# Patient Record
Sex: Male | Born: 1945 | Race: White | Hispanic: No | Marital: Married | Smoking: Former smoker
Health system: Southern US, Community
[De-identification: ages and names within clinical notes are randomized; demographics above are authoritative.]

## PROBLEM LIST (undated history)

## (undated) DIAGNOSIS — M65331 Trigger finger, right middle finger: Secondary | ICD-10-CM

## (undated) DIAGNOSIS — K635 Polyp of colon: Secondary | ICD-10-CM

## (undated) DIAGNOSIS — R600 Localized edema: Secondary | ICD-10-CM

## (undated) DIAGNOSIS — L570 Actinic keratosis: Secondary | ICD-10-CM

## (undated) DIAGNOSIS — N19 Unspecified kidney failure: Secondary | ICD-10-CM

## (undated) DIAGNOSIS — I10 Essential (primary) hypertension: Secondary | ICD-10-CM

## (undated) DIAGNOSIS — T4145XA Adverse effect of unspecified anesthetic, initial encounter: Secondary | ICD-10-CM

## (undated) DIAGNOSIS — T8859XA Other complications of anesthesia, initial encounter: Secondary | ICD-10-CM

## (undated) DIAGNOSIS — M1990 Unspecified osteoarthritis, unspecified site: Secondary | ICD-10-CM

## (undated) DIAGNOSIS — I82409 Acute embolism and thrombosis of unspecified deep veins of unspecified lower extremity: Secondary | ICD-10-CM

## (undated) DIAGNOSIS — Z972 Presence of dental prosthetic device (complete) (partial): Secondary | ICD-10-CM

## (undated) DIAGNOSIS — T7840XA Allergy, unspecified, initial encounter: Secondary | ICD-10-CM

## (undated) DIAGNOSIS — G5603 Carpal tunnel syndrome, bilateral upper limbs: Secondary | ICD-10-CM

## (undated) DIAGNOSIS — Z87442 Personal history of urinary calculi: Secondary | ICD-10-CM

## (undated) HISTORY — DX: Actinic keratosis: L57.0

## (undated) HISTORY — PX: JOINT REPLACEMENT: SHX530

## (undated) HISTORY — DX: Allergy, unspecified, initial encounter: T78.40XA

## (undated) HISTORY — DX: Essential (primary) hypertension: I10

## (undated) HISTORY — PX: EXTRACORPOREAL SHOCK WAVE LITHOTRIPSY: SHX1557

---
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
## 2000-05-03 ENCOUNTER — Other Ambulatory Visit: Admission: RE | Admit: 2000-05-03 | Discharge: 2000-05-03 | Payer: Self-pay | Admitting: Gastroenterology

## 2000-09-07 HISTORY — PX: OSTEOTOMY: SHX137

## 2007-03-17 DIAGNOSIS — Z720 Tobacco use: Secondary | ICD-10-CM | POA: Insufficient documentation

## 2007-12-13 DIAGNOSIS — E291 Testicular hypofunction: Secondary | ICD-10-CM | POA: Insufficient documentation

## 2013-08-18 ENCOUNTER — Inpatient Hospital Stay: Payer: Self-pay | Admitting: Specialist

## 2013-08-18 LAB — URINALYSIS, COMPLETE
Bilirubin,UR: NEGATIVE
Glucose,UR: NEGATIVE mg/dL (ref 0–75)
Hyaline Cast: 2
Ketone: NEGATIVE
Ph: 5 (ref 4.5–8.0)
Protein: NEGATIVE
Specific Gravity: 1.005 (ref 1.003–1.030)

## 2013-08-18 LAB — COMPREHENSIVE METABOLIC PANEL
Alkaline Phosphatase: 75 U/L
Anion Gap: 16 (ref 7–16)
BUN: 135 mg/dL — ABNORMAL HIGH (ref 7–18)
Bilirubin,Total: 0.7 mg/dL (ref 0.2–1.0)
Calcium, Total: 9.2 mg/dL (ref 8.5–10.1)
Chloride: 96 mmol/L — ABNORMAL LOW (ref 98–107)
EGFR (African American): 4 — ABNORMAL LOW
EGFR (Non-African Amer.): 3 — ABNORMAL LOW
Osmolality: 300 (ref 275–301)
SGPT (ALT): 42 U/L (ref 12–78)

## 2013-08-18 LAB — CBC WITH DIFFERENTIAL/PLATELET
Basophil %: 0.5 %
Eosinophil %: 1.4 %
HCT: 38.2 % — ABNORMAL LOW (ref 40.0–52.0)
HGB: 12.8 g/dL — ABNORMAL LOW (ref 13.0–18.0)
Lymphocyte #: 0.8 10*3/uL — ABNORMAL LOW (ref 1.0–3.6)
MCV: 88 fL (ref 80–100)
Monocyte #: 1.5 x10 3/mm — ABNORMAL HIGH (ref 0.2–1.0)
Monocyte %: 11.8 %
RBC: 4.33 10*6/uL — ABNORMAL LOW (ref 4.40–5.90)

## 2013-08-18 LAB — LIPID PANEL
HDL Cholesterol: 38 mg/dL — ABNORMAL LOW (ref 40–60)
Ldl Cholesterol, Calc: 69 mg/dL (ref 0–100)
Triglycerides: 35 mg/dL (ref 0–200)
VLDL Cholesterol, Calc: 7 mg/dL (ref 5–40)

## 2013-08-19 LAB — CK: CK, Total: 26 U/L — ABNORMAL LOW (ref 35–232)

## 2013-08-19 LAB — BASIC METABOLIC PANEL
Anion Gap: 16 (ref 7–16)
Calcium, Total: 8.9 mg/dL (ref 8.5–10.1)
EGFR (Non-African Amer.): 3 — ABNORMAL LOW
Glucose: 101 mg/dL — ABNORMAL HIGH (ref 65–99)
Osmolality: 304 (ref 275–301)
Potassium: 5.4 mmol/L — ABNORMAL HIGH (ref 3.5–5.1)
Sodium: 131 mmol/L — ABNORMAL LOW (ref 136–145)

## 2013-08-20 LAB — BASIC METABOLIC PANEL
Anion Gap: 11 (ref 7–16)
BUN: 96 mg/dL — ABNORMAL HIGH (ref 7–18)
Calcium, Total: 8.4 mg/dL — ABNORMAL LOW (ref 8.5–10.1)
Chloride: 102 mmol/L (ref 98–107)
Co2: 21 mmol/L (ref 21–32)
Creatinine: 11.35 mg/dL — ABNORMAL HIGH (ref 0.60–1.30)
EGFR (African American): 5 — ABNORMAL LOW
EGFR (Non-African Amer.): 4 — ABNORMAL LOW
Glucose: 100 mg/dL — ABNORMAL HIGH (ref 65–99)
Osmolality: 298 (ref 275–301)
Potassium: 4.9 mmol/L (ref 3.5–5.1)
Sodium: 134 mmol/L — ABNORMAL LOW (ref 136–145)

## 2013-08-20 LAB — PHOSPHORUS: Phosphorus: 5.4 mg/dL — ABNORMAL HIGH (ref 2.5–4.9)

## 2013-08-21 LAB — BASIC METABOLIC PANEL
BUN: 53 mg/dL — ABNORMAL HIGH (ref 7–18)
Calcium, Total: 8.8 mg/dL (ref 8.5–10.1)
Chloride: 108 mmol/L — ABNORMAL HIGH (ref 98–107)
Creatinine: 7.08 mg/dL — ABNORMAL HIGH (ref 0.60–1.30)
EGFR (African American): 8 — ABNORMAL LOW

## 2013-08-21 LAB — PROTEIN ELECTROPHORESIS(ARMC)

## 2013-08-22 LAB — BASIC METABOLIC PANEL
Calcium, Total: 9.1 mg/dL (ref 8.5–10.1)
Creatinine: 6.92 mg/dL — ABNORMAL HIGH (ref 0.60–1.30)
EGFR (Non-African Amer.): 7 — ABNORMAL LOW
Potassium: 4.9 mmol/L (ref 3.5–5.1)

## 2013-08-22 LAB — PROTEIN / CREATININE RATIO, URINE: Creatinine, Urine: 29.7 mg/dL — ABNORMAL LOW (ref 30.0–125.0)

## 2013-08-22 LAB — PLATELET COUNT: Platelet: 181 10*3/uL (ref 150–440)

## 2013-08-23 LAB — BASIC METABOLIC PANEL
Anion Gap: 11 (ref 7–16)
BUN: 63 mg/dL — ABNORMAL HIGH (ref 7–18)
Chloride: 104 mmol/L (ref 98–107)
Co2: 21 mmol/L (ref 21–32)
Creatinine: 7.97 mg/dL — ABNORMAL HIGH (ref 0.60–1.30)
EGFR (Non-African Amer.): 6 — ABNORMAL LOW
Glucose: 129 mg/dL — ABNORMAL HIGH (ref 65–99)
Sodium: 136 mmol/L (ref 136–145)

## 2013-08-23 LAB — CBC WITH DIFFERENTIAL/PLATELET
Basophil %: 0.4 %
Eosinophil #: 0 10*3/uL (ref 0.0–0.7)
Eosinophil %: 0 %
HCT: 36.2 % — ABNORMAL LOW (ref 40.0–52.0)
HGB: 12.2 g/dL — ABNORMAL LOW (ref 13.0–18.0)
Lymphocyte #: 0.6 10*3/uL — ABNORMAL LOW (ref 1.0–3.6)
MCH: 30 pg (ref 26.0–34.0)
MCHC: 33.7 g/dL (ref 32.0–36.0)
Monocyte %: 4.1 %
Neutrophil %: 93.5 %
RBC: 4.07 10*6/uL — ABNORMAL LOW (ref 4.40–5.90)

## 2013-08-23 LAB — PROTIME-INR
INR: 1.4
Prothrombin Time: 16.9 secs — ABNORMAL HIGH (ref 11.5–14.7)

## 2013-08-24 LAB — CBC WITH DIFFERENTIAL/PLATELET
Eosinophil #: 0 10*3/uL (ref 0.0–0.7)
Eosinophil %: 0 %
HCT: 33.4 % — ABNORMAL LOW (ref 40.0–52.0)
HGB: 11.4 g/dL — ABNORMAL LOW (ref 13.0–18.0)
Lymphocyte %: 3.4 %
MCH: 30.4 pg (ref 26.0–34.0)
Monocyte %: 6.6 %
Neutrophil %: 89.8 %
RBC: 3.75 10*6/uL — ABNORMAL LOW (ref 4.40–5.90)
RDW: 13.7 % (ref 11.5–14.5)

## 2013-08-24 LAB — URINALYSIS, COMPLETE
Bilirubin,UR: NEGATIVE
Glucose,UR: NEGATIVE mg/dL (ref 0–75)
Ketone: NEGATIVE
Nitrite: NEGATIVE
Protein: 30
RBC,UR: 1734 /HPF (ref 0–5)
WBC UR: 55 /HPF (ref 0–5)

## 2013-08-24 LAB — BASIC METABOLIC PANEL
Anion Gap: 8 (ref 7–16)
BUN: 63 mg/dL — ABNORMAL HIGH (ref 7–18)
Chloride: 105 mmol/L (ref 98–107)
Co2: 25 mmol/L (ref 21–32)
Glucose: 111 mg/dL — ABNORMAL HIGH (ref 65–99)
Osmolality: 294 (ref 275–301)
Sodium: 138 mmol/L (ref 136–145)

## 2013-08-24 LAB — URINE CULTURE

## 2013-08-24 LAB — CREATININE, SERUM
Creatinine: 6.64 mg/dL — ABNORMAL HIGH (ref 0.60–1.30)
EGFR (Non-African Amer.): 8 — ABNORMAL LOW

## 2013-08-24 LAB — WBC: WBC: 23.8 10*3/uL — ABNORMAL HIGH (ref 3.8–10.6)

## 2013-08-25 LAB — BASIC METABOLIC PANEL
Chloride: 106 mmol/L (ref 98–107)
Creatinine: 2.56 mg/dL — ABNORMAL HIGH (ref 0.60–1.30)
EGFR (African American): 29 — ABNORMAL LOW
Glucose: 124 mg/dL — ABNORMAL HIGH (ref 65–99)
Osmolality: 290 (ref 275–301)
Sodium: 139 mmol/L (ref 136–145)

## 2013-08-25 LAB — CBC WITH DIFFERENTIAL/PLATELET
Basophil #: 0.1 10*3/uL (ref 0.0–0.1)
Basophil %: 0.5 %
Eosinophil %: 1.7 %
HCT: 37 % — ABNORMAL LOW (ref 40.0–52.0)
HGB: 12.2 g/dL — ABNORMAL LOW (ref 13.0–18.0)
Lymphocyte %: 5.7 %
MCH: 29.6 pg (ref 26.0–34.0)
MCHC: 33 g/dL (ref 32.0–36.0)
MCV: 90 fL (ref 80–100)
Monocyte %: 5.1 %
Neutrophil #: 15 10*3/uL — ABNORMAL HIGH (ref 1.4–6.5)
RBC: 4.13 10*6/uL — ABNORMAL LOW (ref 4.40–5.90)
WBC: 17.3 10*3/uL — ABNORMAL HIGH (ref 3.8–10.6)

## 2013-08-25 LAB — CULTURE, BLOOD (SINGLE)

## 2013-08-26 LAB — CBC WITH DIFFERENTIAL/PLATELET
Basophil #: 0.1 10*3/uL (ref 0.0–0.1)
Eosinophil #: 0.6 10*3/uL (ref 0.0–0.7)
HCT: 36.4 % — ABNORMAL LOW (ref 40.0–52.0)
HGB: 12.1 g/dL — ABNORMAL LOW (ref 13.0–18.0)
Lymphocyte #: 1.5 10*3/uL (ref 1.0–3.6)
Lymphocyte %: 11.2 %
Monocyte %: 7.9 %
Neutrophil %: 76 %
RBC: 4.09 10*6/uL — ABNORMAL LOW (ref 4.40–5.90)
RDW: 13.2 % (ref 11.5–14.5)
WBC: 13.5 10*3/uL — ABNORMAL HIGH (ref 3.8–10.6)

## 2013-08-26 LAB — BASIC METABOLIC PANEL
Anion Gap: 5 — ABNORMAL LOW (ref 7–16)
BUN: 22 mg/dL — ABNORMAL HIGH (ref 7–18)
Osmolality: 274 (ref 275–301)
Potassium: 3.6 mmol/L (ref 3.5–5.1)

## 2013-08-26 LAB — URINE CULTURE

## 2013-08-28 LAB — BODY FLUID CULTURE

## 2013-08-29 LAB — CULTURE, BLOOD (SINGLE)

## 2013-09-04 ENCOUNTER — Ambulatory Visit: Payer: Self-pay | Admitting: Urology

## 2013-09-14 ENCOUNTER — Ambulatory Visit: Payer: Self-pay | Admitting: Urology

## 2013-09-28 ENCOUNTER — Ambulatory Visit: Payer: Self-pay | Admitting: Urology

## 2013-10-12 ENCOUNTER — Ambulatory Visit: Payer: Self-pay | Admitting: Urology

## 2013-10-31 ENCOUNTER — Ambulatory Visit: Payer: Self-pay | Admitting: Urology

## 2013-10-31 LAB — CBC WITH DIFFERENTIAL/PLATELET
Basophil #: 0.1 10*3/uL (ref 0.0–0.1)
Basophil %: 1.2 %
Eosinophil #: 0.5 10*3/uL (ref 0.0–0.7)
Eosinophil %: 6.9 %
HCT: 41.9 % (ref 40.0–52.0)
HGB: 13.8 g/dL (ref 13.0–18.0)
LYMPHS ABS: 1.5 10*3/uL (ref 1.0–3.6)
LYMPHS PCT: 20.2 %
MCH: 29.5 pg (ref 26.0–34.0)
MCHC: 33 g/dL (ref 32.0–36.0)
MCV: 89 fL (ref 80–100)
MONOS PCT: 10.2 %
Monocyte #: 0.7 x10 3/mm (ref 0.2–1.0)
NEUTROS ABS: 4.4 10*3/uL (ref 1.4–6.5)
NEUTROS PCT: 61.5 %
Platelet: 237 10*3/uL (ref 150–440)
RBC: 4.69 10*6/uL (ref 4.40–5.90)
RDW: 14 % (ref 11.5–14.5)
WBC: 7.2 10*3/uL (ref 3.8–10.6)

## 2013-10-31 LAB — BASIC METABOLIC PANEL
ANION GAP: 5 — AB (ref 7–16)
BUN: 18 mg/dL (ref 7–18)
CHLORIDE: 106 mmol/L (ref 98–107)
CREATININE: 0.75 mg/dL (ref 0.60–1.30)
Calcium, Total: 9.1 mg/dL (ref 8.5–10.1)
Co2: 27 mmol/L (ref 21–32)
GLUCOSE: 90 mg/dL (ref 65–99)
Osmolality: 277 (ref 275–301)
POTASSIUM: 4 mmol/L (ref 3.5–5.1)
Sodium: 138 mmol/L (ref 136–145)

## 2013-10-31 LAB — PROTIME-INR
INR: 1
Prothrombin Time: 13.5 secs (ref 11.5–14.7)

## 2013-10-31 LAB — APTT: Activated PTT: 30.1 secs (ref 23.6–35.9)

## 2013-11-06 ENCOUNTER — Ambulatory Visit: Payer: Self-pay | Admitting: Urology

## 2014-06-29 LAB — LIPID PANEL
CHOLESTEROL: 161 mg/dL (ref 0–200)
HDL: 45 mg/dL (ref 35–70)
LDL Cholesterol: 95 mg/dL
LDL/HDL RATIO: 2.1
Triglycerides: 105 mg/dL (ref 40–160)

## 2014-06-29 LAB — BASIC METABOLIC PANEL
BUN: 13 mg/dL (ref 4–21)
Creatinine: 0.7 mg/dL (ref 0.6–1.3)
GLUCOSE: 94 mg/dL
Potassium: 5 mmol/L (ref 3.4–5.3)
SODIUM: 139 mmol/L (ref 137–147)

## 2014-06-29 LAB — CBC AND DIFFERENTIAL
HEMATOCRIT: 46 % (ref 41–53)
Hemoglobin: 16.1 g/dL (ref 13.5–17.5)
NEUTROS ABS: 4 /uL
PLATELETS: 228 10*3/uL (ref 150–399)
WBC: 5.9 10^3/mL

## 2014-06-29 LAB — HEPATIC FUNCTION PANEL
ALT: 30 U/L (ref 10–40)
AST: 24 U/L (ref 14–40)
Alkaline Phosphatase: 59 U/L (ref 25–125)

## 2014-06-29 LAB — PSA: PSA: 1.1

## 2014-08-09 ENCOUNTER — Ambulatory Visit: Payer: Self-pay | Admitting: Gastroenterology

## 2014-08-09 HISTORY — PX: COLONOSCOPY: SHX174

## 2014-08-09 LAB — HM COLONOSCOPY

## 2014-12-28 NOTE — Consult Note (Signed)
Chief Complaint:  Subjective/Chief Complaint No complaints   VITAL SIGNS/ANCILLARY NOTES: **Vital Signs.:   19-Dec-14 04:36  Vital Signs Type Routine  Temperature Temperature (F) 98.6  Celsius 37  Temperature Source oral  Pulse Pulse 78  Respirations Respirations 18  Systolic BP Systolic BP 277  Diastolic BP (mmHg) Diastolic BP (mmHg) 73  Mean BP 89  Pulse Ox % Pulse Ox % 92  Pulse Ox Activity Level  At rest  Oxygen Delivery Room Air/ 21 %    08:00  Vital Signs Type Q 4hr  Temperature Temperature (F) 98.3  Celsius 36.8  Temperature Source oral  Pulse Pulse 80  Pulse source if not from Vital Sign Device per Telemetry Clerk  Respirations Respirations 18  Systolic BP Systolic BP 412  Diastolic BP (mmHg) Diastolic BP (mmHg) 73  Mean BP 86  Pulse Ox % Pulse Ox % 94  Pulse Ox Activity Level  At rest  Oxygen Delivery Room Air/ 21 %  Telemetry pattern Cardiac Rhythm Normal sinus rhythm; pattern reported by Telemetry Clerk; 80  *Intake and Output.:   19-Dec-14 05:26  Grand Totals Intake:   Output:  1400    Net:  -1400 24 Hr.:  -4950  Other Output ml     Out:  700  Other Output ml     Out:  700    Shift 07:00  Grand Totals Intake:   Output:  1400    Net:  -1400 24 Hr.:  -4950  Other Output ml     Out:  700  Other Output ml     Out:  700  Length of Stay Totals Intake:  11133.39 Output:  24818    Net:  -13684.61    Daily 07:00  Grand Totals Intake:  300 Output:  5250    Net:  -8786 24 Hr.:  -4950  IV (Primary)      In:  300  Other Output ml     Out:  700  Other Output ml     Out:  4550  Length of Stay Totals Intake:  11133.39 Output:  24818    Net:  -13684.61    08:00  Dietary Restrictions  KEPT TRAY    08:15  Grand Totals Intake:   Output:  1050    Net:  -29 24 Hr.:  -1050  Other Output ml     Out:  550  Other Output ml     Out:  500    08:24  Grand Totals Intake:  50 Output:      Net:  37 24 Hr.:  -1000  IV (Secondary)      In:  50   10:22  Grand Totals Intake:   Output:  500    Net:  -500 24 Hr.:  -1500  Other Output ml     Out:  300  Other Output ml     Out:  200    12:15  Grand Totals Intake:   Output:  400    Net:  -400 24 Hr.:  -1900  Other Output ml     Out:  150  Other Output ml     Out:  250    Shift 15:00  Grand Totals Intake:  50 Output:  1950    Net:  -1900 24 Hr.:  -1900  IV (Secondary)      In:  50  Other Output ml     Out:  1000  Other Output ml     Out:  950  Length of Stay Totals Intake:  11183.39 Output:  10211    Net:  -15584.61   Lab Results: Routine Chem:  12-Dec-14 12:22   Creatinine (comp)  14.49  13-Dec-14 05:30   Creatinine (comp)  13.89  14-Dec-14 03:56   Creatinine (comp)  11.35  15-Dec-14 08:40   Creatinine (comp)  7.08  16-Dec-14 05:35   Creatinine (comp)  6.92  17-Dec-14 06:02   Creatinine (comp)  7.97  18-Dec-14 04:36   Creatinine (comp)  6.64  19-Dec-14 05:09   Creatinine (comp)  2.56   Assessment/Plan:  Assessment/Plan:  Assessment Creatinine improving. Can go home anytime from urological standpoint. Follow-up in office   Electronic Signatures: Royston Cowper (MD)  (Signed 19-Dec-14 12:21)  Authored: Chief Complaint, VITAL SIGNS/ANCILLARY NOTES, Lab Results, Assessment/Plan   Last Updated: 19-Dec-14 12:21 by Royston Cowper (MD)

## 2014-12-28 NOTE — Consult Note (Signed)
Chief Complaint:  Subjective/Chief Complaint leaked urine from bladder last night. was unexpected to him but doing better now. no fever, chills, n/v. tubes draining   VITAL SIGNS/ANCILLARY NOTES: **Vital Signs.:   20-Dec-14 08:00  Vital Signs Type Q 4hr  Temperature Temperature (F) 98.1  Celsius 36.7  Temperature Source oral  Pulse Pulse 75  Pulse source if not from Vital Sign Device per Telemetry Clerk  Respirations Respirations 20  Systolic BP Systolic BP 696  Diastolic BP (mmHg) Diastolic BP (mmHg) 76  Mean BP 93  Pulse Ox % Pulse Ox % 95  Pulse Ox Activity Level  At rest  Oxygen Delivery Room Air/ 21 %  *Intake and Output.:   Daily 20-Dec-14 07:00  Grand Totals Intake:  2600 Output:  5700    Net:  -3100 24 Hr.:  -3100  Oral Intake      In:  1170  IV (Primary)      In:  1080  IV (Secondary)      In:  350  Urine ml     Out:  625  Other Output ml     Out:  2125  Other Output ml     Out:  2950  Length of Stay Totals Intake:  13733.39 Output:  30518    Net:  -16784.61   Brief Assessment:  GEN no acute distress   Cardiac Regular   Respiratory normal resp effort   Gastrointestinal details normal Soft  Nontender   Lab Results: Routine Chem:  20-Dec-14 04:00   Glucose, Serum 76  BUN  22  Creatinine (comp) 1.24  Sodium, Serum 136  Potassium, Serum 3.6  Chloride, Serum 103  CO2, Serum 28  Calcium (Total), Serum 9.2  Anion Gap  5  Osmolality (calc) 274  eGFR (African American) >60  eGFR (Non-African American)  60 (eGFR values <86m/min/1.73 m2 may be an indication of chronic kidney disease (CKD). Calculated eGFR is useful in patients with stable renal function. The eGFR calculation will not be reliable in acutely ill patients when serum creatinine is changing rapidly. It is not useful in  patients on dialysis. The eGFR calculation may not be applicable to patients at the low and high extremes of body sizes, pregnant women, and vegetarians.)  Routine Hem:   20-Dec-14 04:00   WBC (CBC)  13.5  RBC (CBC)  4.09  Hemoglobin (CBC)  12.1  Hematocrit (CBC)  36.4  Platelet Count (CBC) 206  MCV 89  MCH 29.6  MCHC 33.2  RDW 13.2  Neutrophil % 76.0  Lymphocyte % 11.2  Monocyte % 7.9  Eosinophil % 4.3  Basophil % 0.6  Neutrophil #  10.2  Lymphocyte # 1.5  Monocyte #  1.1  Eosinophil # 0.6  Basophil # 0.1 (Result(s) reported on 26 Aug 2013 at 06:52AM.)   Assessment/Plan:  Assessment/Plan:  Assessment 69y/o M with sepsis, obstructive uropathy, ureteral stones, improving   Plan - okay to d/c from urology standpoint - patient to call Dr. WLetta Kocheroffice Monday for followup surgery plans   Electronic Signatures: SCharna Archer(MD)  (Signed 20-Dec-14 09:36)  Authored: Chief Complaint, VITAL SIGNS/ANCILLARY NOTES, Brief Assessment, Lab Results, Assessment/Plan   Last Updated: 20-Dec-14 09:36 by SCharna Archer(MD)

## 2014-12-28 NOTE — Discharge Summary (Signed)
PATIENT NAME:  Stephen Ponce, Stephen Ponce MR#:  [id number] DATE OF BIRTH:  [birth year]  DATE OF ADMISSION:  08/18/2013 DATE OF DISCHARGE:  08/26/2013  For a detailed note, please take a look at the history and physical done on admission by Dr. Fritzi Mandes.   DIAGNOSES AT DISCHARGE:  Are as follows:  Acute renal failure secondary to acute tubular necrosis from a cleansing agent for weight loss. Bilateral nephrolithiasis, status post bilateral nephrostomy tube placement, sepsis secondary to Enterococcus faecalis.  Hypertension.   The patient is being discharged home on a low-sodium diet.   ACTIVITY: As tolerated.   Follow up with Dr. Maryan Puls from urology in the next 1 to 2 weeks. Follow up with Dr. Miguel Aschoff in the next 1 to 2 weeks. Also, follow up with Dr. Anthonette Legato or Dr. Murlean Iba in the next 1 to 2 weeks.   DISCHARGE MEDICATIONS:  Metoprolol tartrate 25 mg b.i.d. and Zyvox 600 mg b.i.d. x 11 days.   [place] COURSE: Dr. Adrian Prows, infectious disease; Dr. Maryan Puls, urology: Dr. Murlean Iba, nephrology; Dr. Glean Salen, nephrology. Dr. Hortencia Pilar from vascular surgery.   Please take a look at the detailed interim hospital course done by Dr. Anselm Jungling, which covers hospital course from December 12th until December 19th.  This is just a brief interim hospital course.  1. Sepsis. The patient had Enterococcus faecalis sepsis secondary to nephrolithiasis and hydronephrosis. The patient underwent bilateral nephrostomy tube placement done by Dr. Yves Dill. Post nephrostomy tube placement, the patient has been hemodynamically stable, draining well from his nephrostomy tubes. He is being discharged on oral Zyvox. He was maintained on IV Zyvox while in the hospital.  The patient will receive a total of 2 weeks of IV antibiotic therapy for his bacteremia.  2.  Acute renal failure. Initially, the patient's acute renal failure was suspected to be due to ATN from  using a cleansing substitute called Isagenix. The patient's creatinine on admission was as high as 15. The patient had to undergo dialysis while in the hospital. After getting dialysis, the patient's creatinine has significantly improved. It is down to baseline. On the day of discharge, the patient's creatinine is 1.2. The patient did have a setback as he developed nephrolithiasis and hydronephrosis after being in the hospital for a few days, but after getting his nephrostomy tubes  placed, his creatinine has now come much improved. The patient will continue followup with urology and nephrology as an outpatient upon discharge.  3.  Hyperkalemia. This was secondary to the acute renal failure. It has since then improved and now resolved.  4.  Hypertension. The patient did not have any diagnosed high blood pressure prior to coming to the hospital. He did have some issues with having some elevated blood pressures; therefore, was  being discharged on some oral metoprolol presently.  5.  Enterococcus faecalis bacteremia. This was secondary to the hydronephrosis and nephrolithiasis. The patient was treated with IV Zyvox while in the hospital and is being discharged on oral Zyvox, as stated, for the next 11 days.   The patient is a FULL CODE.  He is being discharged home with home health nursing services.   TIME SPENT: 40 minutes.   ____________________________ Belia Heman. Verdell Carmine, MD vjs:dmm D: 08/28/2013 46:65:99 ET T: 08/28/2013 22:53:23 ET JOB#: [id number]  cc: Belia Heman. Verdell Carmine, MD, <Dictator> Munsoor Lilian Kapur, MD Murlean Iba, MD Henreitta Leber MD ELECTRONICALLY SIGNED 08/31/2013 13:41

## 2014-12-28 NOTE — Op Note (Signed)
PATIENT NAME:  Stephen Ponce, Stephen Ponce MR#:  [id number] DATE OF BIRTH:  [birth year]  DATE OF PROCEDURE:  09/04/2013  PREOPERATIVE DIAGNOSIS: Bilateral ureterolithiasis.   POSTOPERATIVE DIAGNOSIS:  Bilateral ureterolithiasis.   PROCEDURE: Cystoscopy with bilateral stent placement.   SURGEON: Maryan Puls, M.D.   ANESTHETIST: Rice and Yves Dill.   ANESTHETIC METHOD: General per Rice and local per Yves Dill.   INDICATIONS: See the dictated history and physical. After informed consent, the patient requests the above procedure.   OPERATIVE SUMMARY: After adequate general anesthesia had been obtained, the patient was placed into dorsal lithotomy position and the perineum was prepped and draped in the usual fashion. The 21-French cystoscope was coupled with the camera and then visually advanced into the bladder. 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 The patient had trilobar BPH with visual obstruction. Both ureteral orifices were identified and had clear efflux. Fluoroscopy confirmed presence of bilateral nephrostomy tubes. The patient had a left upper ureteral stone present and stone present within the right renal pelvis. At this point, a 0.035 Glidewire was advanced up the right ureter under fluoroscopic guidance. A 6 x 26 cm double pigtail stent was advanced over the guidewire and positioned in the ureter. Guidewire was then removed, taking care to leave the stent in position. The procedure was repeated on the left side in an identical fashion. At this point, the bladder was drained and the scope was removed. Then 10 mL of viscous Xylocaine was instilled within the urethra. A B and O suppository was placed. The procedure was then terminated and the patient was transferred to the recovery room in stable condition.    ____________________________ Otelia Limes. Yves Dill, MD mrw:cs D: 09/04/2013 13:58:34 ET T: 09/04/2013 15:08:51 ET JOB#: [id number]  cc: Otelia Limes.  Yves Dill, MD, <Dictator> Royston Cowper MD ELECTRONICALLY SIGNED 09/04/2013 18:00

## 2014-12-28 NOTE — H&P (Signed)
PATIENT NAME:  TAMMY, ERICSSON MR#:  [id number] DATE OF BIRTH:  Sep 21, 1945  DATE OF ADMISSION:  08/18/2013  PRIMARY CARE PHYSICIAN: Richard L. Rosanna Randy, MD  CHIEF COMPLAINT: Elevated abnormal kidney labs. Unable to urinate since last Friday.   HISTORY OF PRESENT ILLNESS: Mr. Choung is a pleasant 69 year old Caucasian gentleman with no significant past medical history, who is admitted from Dr. Keturah Barre office with elevated BUN and creatinine. The patient came in to see Dr. Candiss Norse after he was referred by Dr. Rosanna Randy due to his inability to have had urine output for the last 1 week. He was seen by Dr. Elnoria Howard, urology, and attempt was made to put in a Foley catheter; however, since the patient has had the catheter he has not had any urine output. He is being admitted for acute renal failure and further evaluation and management for the same.   According to the patient, he started some weight loss program with Isagenix, which is a supplemental powder that he drinks to lose weight, and there was a week where he had drunk some liquid chemical for "cleansing purposes." The patient says since after that, he started having some nausea, felt low in energy. He lost about 20 pounds as part of weight loss program. His intake: He was cutting down his portions on his diet. His appetite slowly started to go down. He started having difficulty urinating with minimal urine output since last Friday. He saw Dr. Elnoria Howard, as mentioned above, who put in a Foley catheter without any urine output. Went to see Dr. Rosanna Randy, who referred the patient to Dr. Candiss Norse, and the patient is being admitted for further evaluation and management of his acute renal failure.   PAST MEDICAL HISTORY: None.   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   ALLERGIES: ERYTHROMYCIN, PENICILLIN AND ZITHROMAX.   MEDICATION: The patient takes multivitamins, and family is still to bring the rest of all his  supplemental medication which he takes at home.   FAMILY HISTORY: Mother was healthy, passed away at age 53, and father had severe emphysema.   SOCIAL HISTORY: He is a retired. Denies any smoking. Drinks alcohol socially in the form of beer and wine.   REVIEW OF SYSTEMS: CONSTITUTIONAL: Positive for fatigue, weakness and weight loss, intentional, 20 pounds in 4 weeks.  EYES: No blurred or double vision, glaucoma or cataracts.  ENT: No tinnitus, ear pain, sinus drainage.  RESPIRATORY: No cough, wheeze, hemoptysis or shortness of breath.  CARDIOVASCULAR: No chest pain, orthopnea, edema or hypertension.  GASTROINTESTINAL: Positive for nausea. No vomiting, melena, abdominal pain or constipation.  GENITOURINARY: No dysuria or hematuria. Positive for oliguria at this time.  MUSCULOSKELETAL: No arthritis, swelling or gout.  NEUROLOGIC: No CVA, TIA, dysarthria, vertigo or seizures.  PSYCHIATRIC: No anxiety or depression or bipolar disorder.  All other systems reviewed and negative.   PHYSICAL EXAMINATION:  GENERAL: The patient is awake, alert, oriented x3, not in acute distress.  VITAL SIGNS: He is afebrile, pulse is 74, respirations 18, blood pressure is 182/93, pulse oximetry is 96% on room air.  HEENT: Atraumatic, normocephalic. PERRLA. EOM intact. Oral mucosa is moist.  NECK: Supple. No JVD. No carotid bruits.  RESPIRATORY: Clear to auscultation bilaterally. Decreased breath sounds at the bases. No rales, rhonchi, respiratory distress or labored breathing.  CARDIOVASCULAR: Both the heart sounds are normal. Rate and rhythm regular. PMI not lateralized. Chest nontender. Feeble pedal pulses. Good femoral pulses.  There is 2 to 3+ pitting edema in both lower extremities.  ABDOMEN: Soft, benign, nontender. No organomegaly.  NEUROLOGIC: Grossly intact cranial nerves II through XII. No motor or sensory deficit.  PSYCHIATRIC: The patient is awake, alert, oriented x3.  SKIN: Warm and dry.    ELECTROCARDIOGRAM: Pending.   LABORATORY DATA: Creatinine is 14.4, sodium is 128, BUN is 135, glucose is 91, potassium is 5.9, chloride is 96, bicarbonate is 16. H and H is 12.8 and 38.2, white count is 12.7. Lipid profile within normal limits.   ASSESSMENT: The 69 year old Mr. Wien presented with increasing leg edema, feeling bloated, weight gain, along with decreased urine output for the past 1 week. He has been taking Isagenix along with some cleansing agent for weight loss program since October 2014, Halloween time. He is being admitted with:   1. Acute renal failure, suspected due to some cleansing agents used for weight loss program. Creatinine in the past has been 9.0 came up to 14.49. I do not have any baseline labs to compare to. The patient does not have any uremic symptoms other than feeling nauseous. The patient has been admitted on the medical floor. Renal diet. Will continue some IV fluids. Dr. Holley Raring to see the patient. The patient will likely get urgent hemodialysis given his severe hyperkalemia, severe acute renal failure with creatinine trending now up to 14.49. Will check I's and O's. Avoid nephrotoxins. Further lab orders and workup per Dr. Holley Raring.  2. Severe hyperkalemia. The patient will need urgent hemodialysis. In the meantime, will give calcium gluconate, D50 and IV insulin. Will repeat labs later to ensure potassium trending down. Will get a STAT EKG. Urgent hemodialysis is being planned by Dr. Holley Raring.  3. Elevated blood pressure without diagnosis of hypertension. Will continue to monitor BP, p.r.n. hydralazine will be given; however, if blood pressure remains elevated, will start p.o. agents.  4. Deep vein thrombosis prophylaxis. Subcutaneous heparin.  5. Gastrointestinal prophylaxis. Will give him some p.o. Protonix given his nausea.   The above was discussed with the patient and the patient's wife and daughter. The case was also discussed with Dr. Holley Raring.    TIME  SPENT: 50 minutes.   ____________________________ Hart Rochester Posey Pronto, MD sap:lb D: 08/18/2013 13:51:13 ET T: 08/18/2013 14:12:43 ET JOB#: [id number]  cc: Latisha Lasch A. Posey Pronto, MD, <Dictator> Richard L. Rosanna Randy, MD Ilda Basset MD ELECTRONICALLY SIGNED 08/24/2013 11:00

## 2014-12-28 NOTE — Consult Note (Signed)
PATIENT NAME:  Stephen Ponce, Stephen Ponce MR#:  [id number] DATE OF BIRTH:  [birth year]  DATE OF CONSULTATION:  08/25/2013  CONSULTING PHYSICIAN:  Cheral Marker. Ola Spurr, MD  REFERRING PHYSICIAN: Dr. Anselm Jungling  REASON FOR CONSULT: Enterococcal bacteremia and presumed urinary tract infection, with bilateral nephrostomy tubes for hydronephrosis.   HISTORY OF PRESENT ILLNESS: This is a pleasant, 69 year old gentleman who was previously quite healthy. He was admitted December 12 with acute renal failure after starting supplements. He had been seen in Nephrology and Urology practices, and was admitted for acute indication with worsening renal failure. At that time, ultrasound done in Dr. Guinevere Ferrari office did not show hydronephrosis. The patient was started on dialysis; however, he then developed a fever. He had no urine output. Ultrasound showed hydronephrosis and CAT scan confirmed it, with bilateral nephrolithiasis. He underwent placement of nephrostomy tubes on December 17. His white count at that time was quite elevated. He was started on antibiotics, and has clinically improved. Blood cultures are growing enterococcus in 2 out of 2 blood cultures.   PAST MEDICAL HISTORY:  Previously healthy, as above.   SURGICAL HISTORY: Nephrostomy tube placement as above.   ALLERGIES:  The patient is allergic to PENICILLIN. He said he had taken it for many years off and on, but then one time he took it and developed severe facial swelling. He is also allergic to ERYTHROMYCIN and ZITHROMAX.   SOCIAL HISTORY:  He is retired. Does not smoke. Drinks socially. Mother passed away at age 13. Father had COPD.   ANTIBIOTICS SINCE ADMISSION: Include Levofloxacin, started on December 17th and continued until December 17th. Linezolid was begun on December 18, and continues on it currently. Meropenem was given December 17.   PHYSICAL EXAMINATION: VITAL SIGNS: Temperature 98.8, pulse 76, blood pressure 125/84, sat 94% on room air.  Last  temperature was December 17 at 101.  GENERAL: He is pleasant, interactive, in no acute distress.  HEENT: Pupils equal, round, reactive to light and accommodation. Extraocular movements are intact. Sclerae anicteric. Oropharynx is clear.  NECK: Supple.  HEART: Regular.  LUNGS: Clear.  ABDOMEN: Soft, nontender, nondistended. No hepatosplenomegaly. Has bilateral nephrostomy tubes. The one on the right is draining bloody urine. The one on the left has clear urine.  EXTREMITIES: He has no clubbing, cyanosis or edema.  NEUROLOGIC: He is alert and oriented x 3. Grossly nonfocal neuro exam.   DATA:  CT of the abdomen done December 17 showed a 0.9 cm stone at the right UVJ and a 1.3 cm stone in the superior aspect of the left ureter, moderate bilateral pelvocaliectasis and  perinephric stranding. Blood cultures x 2 grew enterococcus sensitive to ampicillin and Zyvox. Urine culture December 17 shows mixed organisms. Another urine culture from December 17 is growing gram-positive cocci. Urinalysis December 12 showed 172 white cells. No urinalysis seems to be available on the fluid in the nephrostomy tube, although there is another one, I am not sure where it is from, that shows 55 white cells and 1734 red cells. Followup blood cultures December 18 are negative.   IMPRESSION:  A 69 year old, previously healthy male, admitted after using some dietary supplements, but what ended up being bilateral hydronephrosis and acute renal failure, likely from renal stones. He now has bilateral nephrostomy tubes in place. He was quite ill, with fevers, tachycardia, elevated white count. Blood cultures grew enterococcus. This was presumed from a urinary source, and unlikely to be the same organism growing in his urine. He is much better since  placement of the nephrostomy tubes, and his urine output has improved after a few sessions of dialysis. He does remain on Zyvox.   RECOMMENDATIONS:   1.  At this point, it is likely  urosepsis from the hydronephrosis. I see no reason at this point to worry about endocarditis, as this is an acute illness. I would recommend, however, checking a TEE, since that has not been done. Follow up blood cultures are pending, but are negative to date. I did discuss the case with Dr. Eliberto Ivory. The patient will need at least a week of antibiotic therapy to sterilize his urine and blood stream prior to interventions such as lithotripsy to remove the stones. He would then likely have internalization of his stents. At this point, I would recommend we continue him on Zyvox, as this can be given orally. He is very allergic to PENICILLIN, so I do not think ampicillin or amoxicillin would be a good alternative. I would recommend at least a 2-week course from the date of the negative blood culture. Zyvox can cause pancytopenia as well as peripheral neuropathy and ophthalmologic issues. This was discussed with him. We will monitor for those side effects. He would need a weekly CBC while he is on the Zyvox.   2.  Dr. Rogers Blocker will likely schedule the patient next week for evaluation and possible internalization of his stents and removal of the kidney stones. I would suggest a one-week course of Zyvox after that.   3.  After results of his urine culture are back, and he has been treated for his bacteremia with the Zyvox, we could potentially switch him to an agent that is active in the urine, if he needs further treatment. Hopefully, the urine culture done here will shows other sensitivities.   Thank you for the consult. Will be glad to follow with you. If the patient is discharged over the weekend, please arrange follow up with my clinic on December 30.    ____________________________ Cheral Marker. Ola Spurr, MD dpf:mr D: 08/25/2013 18:33:00 ET T: 08/25/2013 21:18:30 ET JOB#: [id number]  cc: Cheral Marker. Ola Spurr, MD, <Dictator> Raj Landress Ola Spurr MD ELECTRONICALLY SIGNED 09/03/2013 21:30

## 2014-12-28 NOTE — Consult Note (Signed)
PATIENT NAME:  Stephen Ponce, Stephen Ponce MR#:  [id number] DATE OF BIRTH:  [birth year]  DATE OF CONSULTATION:  08/23/2013  REQUESTING PHYSICIAN:  Dr. Anselm Jungling CONSULTING PHYSICIAN:  Otelia Limes. Yves Dill, MD  REASON FOR CONSULTATION:  Acute renal failure, sepsis and ureteral stones.   HISTORY OF PRESENT ILLNESS: Mr. Siler is a 69 year old white male who apparently was admitted to the nephrology service with acute renal failure and had dialysis done. He has not had any significant urine output for over a week, in spite of Foley catheter drainage. He had a renal ultrasound earlier today which indicated 15 mm obstructing right UPJ stone, as well as a 10 mm left renal stone with mild left hydronephrosis. I had spoken with Dr. Anselm Jungling earlier today and recommended CT scan. CT scan confirmed right UPJ stone, but also that he had significant left hydronephrosis due to upper stone. I recommend emergent bilateral nephrostomy tube placement.   ALLERGIES: ERYTHROMYCIN, PENICILLIN, ZITHROMAX.  MEDICATIONS: The patient normally takes no medications.  PAST SURGICAL HISTORY: He had repair foot surgery in 2002.  SOCIAL HISTORY: He denied tobacco use and drinks alcohol socially.   FAMILY HISTORY: Negative for renal disease.  PAST AND CURRENT MEDICAL CONDITIONS:  Negative.  REVIEW OF SYSTEMS: The patient denied heart disease, lung disease, diabetes, stroke or hypertension.  CT and ultrasound results were reviewed.   PERTINENT LABORATORY STUDIES:  Included BUN of 63 and a creatinine of 7.97 today.  CBC indicated white cell count of 29,100 with hematocrit of 36.2%.   IMPRESSION: 1.  Acute renal failure due to bilateral ureteral stones with obstruction.  2.  Urinary sepsis.  PLAN: 1.  Emergent bilateral nephrostomy tube placement.  2.  Urine for culture was obtained through the nephrostomy tubes.  3.  Treat the sepsis with appropriate antibiotics as per culture results.  4.  Follow up with Dr. Elnoria Howard after discharge   for definitive stone retrieval after urinary tract infection has been fully treated.   ____________________________ Otelia Limes. Yves Dill, MD mrw:ce D: 08/23/2013 17:40:22 ET T: 08/23/2013 18:32:32 ET JOB#: [id number]  cc: Otelia Limes. Yves Dill, MD, <Dictator> Royston Cowper MD ELECTRONICALLY SIGNED 08/23/2013 21:18

## 2014-12-28 NOTE — H&P (Signed)
PATIENT NAME:  Stephen Ponce, Stephen Ponce MR#:  [id number] DATE OF BIRTH:  1946-08-26  DATE OF ADMISSION:  09/04/2013  CHIEF COMPLAINT: Kidney stone.   HISTORY OF PRESENT ILLNESS: Mr. Delacruz is a 69 year old white male who presented with acute renal failure due to bilateral ureteral obstruction due to urinary calculi. He underwent emergent nephrostomy tube placement bilaterally. His creatinine has now returned back to normal level of 1.2. KUB in the office confirmed bilateral stones. It appeared that he had a large stone still present in the left upper ureter but the right ureteral stone had migrated back into the renal pelvis. He comes in now for cystoscopy with bilateral stent placement.   ALLERGIES: ERYTHROMYCIN, PENICILLIN AND ZITHROMAX.   CURRENT MEDICATIONS: Metoprolol, Zyvox, Levaquin and multivitamins.   PAST SURGICAL HISTORY: Repair of injured right foot, 2002.   SOCIAL HISTORY: Denied  tobacco use. Consumes alcohol socially.   FAMILY HISTORY: Negative for urologic disease.   CURRENT MEDICAL CONDITIONS: Negative.   REVIEW OF SYSTEMS: The patient denied hitory of  heart disease, chest pain, shortness of breath, diabetes, stroke.  PHYSICAL EXAMINATION:  GENERAL: A well-nourished white male in no acute distress.  HEENT: Sclerae were clear. Pupils were equally round and reactive to light and accommodation. Extraocular movements were intact.  NECK: Supple. No palpable cervical adenopathy.  LUNGS: Clear to auscultation.  CARDIOVASCULAR: Regular rhythm and rate without audible murmurs.  ABDOMEN: Soft, nontender abdomen.   GU/RECTAL: Deferred.   NEUROMUSCULAR: Grossly intact. The patient is alert and oriented x 3.   IMPRESSION: Bilateral nephrolithiasis and ureterolithiasis status post bilateral nephrostomy tube placement.   PLAN: Cystoscopy with bilateral stent placement.   ____________________________ Otelia Limes. Yves Dill, MD mrw:sg D: 08/30/2013 09:44:09 ET T: 08/30/2013 10:04:09  ET JOB#: [id number]  cc: Otelia Limes. Yves Dill, MD, <Dictator> Royston Cowper MD ELECTRONICALLY SIGNED 08/31/2013 13:10

## 2014-12-28 NOTE — Consult Note (Signed)
Chief Complaint:  Subjective/Chief Complaint No complaints   VITAL SIGNS/ANCILLARY NOTES: **Vital Signs.:   18-Dec-14 00:15  Temperature Temperature (F) 100  Celsius 37.7  Temperature Source oral  Pulse Pulse 80  Systolic BP Systolic BP 135  Diastolic BP (mmHg) Diastolic BP (mmHg) 70  Mean BP 91  Pulse Ox % Pulse Ox % 92  Pulse Ox Heart Rate 80    01:00  Temperature Temperature (F) 98.4  Celsius 36.8  Temperature Source oral  Pulse Pulse 80  Systolic BP Systolic BP 137  Diastolic BP (mmHg) Diastolic BP (mmHg) 69  Mean BP 91  Pulse Ox % Pulse Ox % 92  Oxygen Delivery Room Air/ 21 %  Pulse Ox Heart Rate 80    02:00  Pulse Pulse 74  Respirations Respirations 18  Systolic BP Systolic BP 129  Diastolic BP (mmHg) Diastolic BP (mmHg) 64  Mean BP 85  Pulse Ox % Pulse Ox % 93  Pulse Ox Heart Rate 76    02:23  Pulse Pulse 76  Systolic BP Systolic BP 129  Diastolic BP (mmHg) Diastolic BP (mmHg) 64  Mean BP 85  Pulse Ox % Pulse Ox % 92  Pulse Ox Heart Rate 76    03:00  Pulse Pulse 72  Respirations Respirations 17  Systolic BP Systolic BP 126  Diastolic BP (mmHg) Diastolic BP (mmHg) 68  Mean BP 87  Pulse Ox % Pulse Ox % 92  Pulse Ox Heart Rate 72    04:00  Pulse Pulse 72  Respirations Respirations 18  Systolic BP Systolic BP 117  Diastolic BP (mmHg) Diastolic BP (mmHg) 66  Mean BP 83  Pulse Ox % Pulse Ox % 92  Pulse Ox Heart Rate 72    05:00  Temperature Temperature (F) 98.6  Celsius 37  Temperature Source oral  Pulse Pulse 70  Respirations Respirations 19  Systolic BP Systolic BP 134  Diastolic BP (mmHg) Diastolic BP (mmHg) 70  Mean BP 91  Pulse Ox % Pulse Ox % 92  Oxygen Delivery Room Air/ 21 %  Pulse Ox Heart Rate 72    06:00  Pulse Pulse 72  Systolic BP Systolic BP 145  Diastolic BP (mmHg) Diastolic BP (mmHg) 71  Mean BP 95  Pulse Ox % Pulse Ox % 94  Oxygen Delivery Room Air/ 21 %  Pulse Ox Heart Rate 74    07:15  Vital Signs Type Routine   Temperature Source oral  Pulse Pulse 70  Respirations Respirations 20  Systolic BP Systolic BP 136  Diastolic BP (mmHg) Diastolic BP (mmHg) 72  Mean BP 93  Pulse Ox % Pulse Ox % 93  Oxygen Delivery Room Air/ 21 %  Pulse Ox Heart Rate 70    07:30  Telemetry pattern Cardiac Rhythm Normal sinus rhythm    08:15  Vital Signs Type Routine  Temperature Temperature (F) 98.1  Celsius 36.7  Temperature Source oral  Pulse Pulse 72  Pulse source if not from Vital Sign Device per cardiac monitor  Respirations Respirations 22  Systolic BP Systolic BP 138  Diastolic BP (mmHg) Diastolic BP (mmHg) 69  Mean BP 92  BP Source  if not from Vital Sign Device non-invasive  Pulse Ox % Pulse Ox % 94  Pulse Ox Activity Level  At rest  Oxygen Delivery Room Air/ 21 %  Pulse Ox Heart Rate 72   Brief Assessment:  Additional Physical Exam L nephrostomy is draining well and clear. R side not draining  Lab Results: Routine Chem:  18-Dec-14 04:36   Glucose, Serum  111  BUN  63  Sodium, Serum 138  Potassium, Serum 4.5  Chloride, Serum 105  CO2, Serum 25  Calcium (Total), Serum 9.1  Anion Gap 8 (Result(s) reported on 24 Aug 2013 at 05:11AM.)  Osmolality (calc) 294  Creatinine (comp)  6.64  eGFR (African American)  9  eGFR (Non-African American)  8 (eGFR values <36mL/min/1.73 m2 may be an indication of chronic kidney disease (CKD). Calculated eGFR is useful in patients with stable renal function. The eGFR calculation will not be reliable in acutely ill patients when serum creatinine is changing rapidly. It is not useful in  patients on dialysis. The eGFR calculation may not be applicable to patients at the low and high extremes of body sizes, pregnant women, and vegetarians.)  Routine Hem:  18-Dec-14 04:36   RBC (CBC)  3.75  Hemoglobin (CBC)  11.4  Hematocrit (CBC)  33.4  Platelet Count (CBC) 151  MCV 89  MCH 30.4  MCHC 34.1  RDW 13.7  Neutrophil % 89.8  Lymphocyte % 3.4  Monocyte %  6.6  Eosinophil % 0.0  Basophil % 0.2  Neutrophil #  21.4  Lymphocyte #  0.8  Monocyte #  1.6  Eosinophil # 0.0  Basophil # 0.1 (Result(s) reported on 24 Aug 2013 at 05:11AM.)  WBC (CBC)  23.8 (Result(s) reported on 24 Aug 2013 at 05:11AM.)   Assessment/Plan:  Assessment/Plan:  Assessment Creatinine improving. Good urine output.   Plan Need to have R nephrostomy tube re-positioned.   Electronic Signatures: Royston Cowper (MD)  (Signed 18-Dec-14 12:05)  Authored: Chief Complaint, VITAL SIGNS/ANCILLARY NOTES, Brief Assessment, Lab Results, Assessment/Plan   Last Updated: 18-Dec-14 12:05 by Royston Cowper (MD)

## 2014-12-28 NOTE — Consult Note (Signed)
Brief Consult Note: Diagnosis: Acute renal failure due to bilateral ureterolithiasis. Urinary sepsis.   Patient was seen by consultant.   Consult note dictated.   Recommend to proceed with surgery or procedure.   Recommend further assessment or treatment.   Orders entered.   Discussed with Attending MD.   Comments: Emergent nephrostomy tube placement bilaterally. Cultures of nephrostomy urine obtained. Follow-up with Dr. Elnoria Howard after discharge to schedule definitive stone removal after UTI resolved.  Electronic Signatures: Royston Cowper (MD)  (Signed 17-Dec-14 17:34)  Authored: Brief Consult Note   Last Updated: 17-Dec-14 17:34 by Royston Cowper (MD)

## 2014-12-29 NOTE — H&P (Signed)
PATIENT NAME:  Stephen Ponce, Stephen Ponce MR#:  [id number] DATE OF BIRTH:  1945/10/16  DATE OF ADMISSION:  11/06/2013  DATE OF SURGERY: The patient has same-day surgery on 11/06/2013.   CHIEF COMPLAINT: Bilateral urinary calculi. Difficulty voiding  HISTORY OF PRESENT ILLNESS: Stephen Ponce is a 69 year old white male with bilateral urinary calculi. He underwent stent placement December 2014 followed by lithotripsy. He now is relatively stone free and comes in for stent removal. 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  PAST MEDICAL HISTORY:   ALLERGIES: ERYTHROMYCIN, PENICILLIN, AND ZITHROMAX.   MEDICATIONS: He was currently taking no medications.   PAST SURGICAL HISTORY:  1.  Repair of an injured foot in 2002.  2.  Bilateral stent placement December 2014.  3.  Lithotripsy in February 2015.   SOCIAL HISTORY: Denied tobacco use. Drinks alcohol socially.   FAMILY HISTORY: Noncontributory.   PAST AND CURRENT MEDICAL CONDITIONS: Negative.   REVIEW OF SYSTEMS: The patient denied heart disease, chest pain, shortness of breath, diabetes, stroke, or hypertension.   PHYSICAL EXAMINATION:  GENERAL: Well-nourished white male in no distress.  HEENT: Sclerae were clear.  NECK: Supple. No palpable cervical adenopathy.  LUNGS: Clear to auscultation.  CARDIOVASCULAR: Regular rhythm and rate without audible murmurs.  ABDOMEN: Soft, nontender abdomen.  GENITOURINARY: Uncircumcised. Testes smooth and nontender.  RECTAL: 45 gm. prostate. NEUROMUSCULAR: Alert and oriented x3.   IMPRESSION: Bilateral urinary calculi status post stent placement and extracorporeal shockwave lithotripsy. BPH with bladder outlet obstruction. PLAN: Cystoscopy with bilateral stent removal. PVP greenlight laser.   ____________________________ Otelia Limes. Yves Dill, MD mrw:np D: 10/31/2013 17:38:00 ET T: 10/31/2013 18:04:45  ET JOB#: [id number]  cc: Otelia Limes. Yves Dill, MD, <Dictator> Royston Cowper MD ELECTRONICALLY SIGNED 11/01/2013 8:49

## 2014-12-29 NOTE — Op Note (Signed)
PATIENT NAME:  Stephen Ponce, ENGRAM MR#:  [id number] DATE OF BIRTH:  10/26/1945  DATE OF PROCEDURE:  11/06/2013  DATE OF DICTATION: 11/06/2013   PREOPERATIVE DIAGNOSES:  1. Benign prostatic hypertrophy with bladder outlet obstruction.  2. Bilateral kidney stones.   POSTOPERATIVE DIAGNOSES:  1. Benign prostatic hypertrophy with bladder outlet obstruction.  2. Bilateral kidney stones.  3. Urethral stricture disease.   PROCEDURES:  1. Photovaporization of the prostate with GreenLight laser.  2. Visual internal urethrotomy with GreenLight laser.  3. Bilateral stent removal.   SURGEON: Otelia Limes. Yves Dill, MD  ANESTHETIST: Alvina Filbert. Andree Elk, MD, and Otelia Limes. Yves Dill, MD  ANESTHETIC METHOD: General per Dr. Andree Elk and local per Dr. Yves Dill.   INDICATIONS: See the dictated history and physical. After informed consent, the patient requests the above procedures.   OPERATIVE SUMMARY: After adequate general anesthesia had been obtained, the patient was placed into dorsal lithotomy position, and the perineum was prepped and draped in the usual fashion. The 21 French laser scope was coupled with the camera and then visually advanced into the urethra. The scope could not be advanced beyond the bulbar urethra due to the presence of a stricture. The aperture of the stricture was approximately 48 [place]. The XPS GreenLight laser fiber was introduced through the scope and laser incised at the 12 o'clock position, allowing entry of the scope into the bladder. The bladder was heavily trabeculated with a posterior diverticulum present. The patient was noted to have trilobar BPH. Both stents were present. Using the alligator forceps, both stents were removed. At this point, the XPS laser fiber was reintroduced through the scope and set at 80 watts. The median lobe and bladder neck tissue was vaporized. Power was increased to 120 watts, and tissue from the bladder neck to the verumontanum vaporized. Finally, the power was  increased to 180 watts, and remaining obstructive tissue was vaporized. At this point, the scope was removed. Viscous Xylocaine 10 mL was instilled within the urethra and the bladder. A 20 French silicone catheter was placed and irrigated until clear. A B and O suppository was placed. The procedure was then terminated, and the patient was transferred to the recovery room in stable condition.   ____________________________ Otelia Limes. Yves Dill, MD mrw:lb D: 11/06/2013 11:51:48 ET T: 11/07/2013 07:06:23 ET JOB#: [id number]  cc: Otelia Limes. Yves Dill, MD, <Dictator> Royston Cowper MD ELECTRONICALLY SIGNED 11/07/2013 13:29

## 2014-12-29 NOTE — Op Note (Signed)
PATIENT NAME:  Stephen Ponce, KAUK MR#:  [id number] DATE OF BIRTH:  01/30/1946  DATE OF PROCEDURE:  08/18/2013  PREOPERATIVE DIAGNOSES: Acute renal failure.   POSTOPERATIVE DIAGNOSES: Acute renal failure.   PROCEDURE PERFORMED: Insertion of a right IJ temporary dialysis catheter with ultrasound guidance.   SURGEON: Hortencia Pilar, M.D.   [place]: The patient is positioned supine in his hospital room and his right neck is prepped and draped in sterile fashion. Ultrasound is placed in a sterile sleeve. Jugular vein is identified. It is echolucent and compressible indicating patency. Image is recorded, and under real-time visualization, a Seldinger needle is inserted into the jugular vein. J-wire is then advanced. A small incision is made at the wire insertion site. Dilator is passed over the wire and subsequently a 20 cm dialysis catheter is advanced over the wire without difficulty. All three lumens aspirate and flush easily. The catheter is secured to the skin of the neck with 2-0 nylon and a sterile dressing is applied. The patient tolerated the procedure well, and there were no immediate complications. Chest x-ray confirms a good placement. No hemopneumothorax.    ____________________________ Katha Cabal, MD ggs:np D: 08/18/2013 17:57:22 ET T: 08/18/2013 19:21:29 ET JOB#: [id number]  cc: Katha Cabal, MD, <Dictator> Munsoor Lilian Kapur, MD Katha Cabal MD ELECTRONICALLY SIGNED 09/12/2013 19:29

## 2015-04-25 ENCOUNTER — Encounter: Payer: Self-pay | Admitting: Gastroenterology

## 2015-06-28 DIAGNOSIS — N19 Unspecified kidney failure: Secondary | ICD-10-CM | POA: Insufficient documentation

## 2015-06-28 DIAGNOSIS — E669 Obesity, unspecified: Secondary | ICD-10-CM | POA: Insufficient documentation

## 2015-06-28 DIAGNOSIS — N529 Male erectile dysfunction, unspecified: Secondary | ICD-10-CM | POA: Insufficient documentation

## 2015-07-05 ENCOUNTER — Ambulatory Visit (INDEPENDENT_AMBULATORY_CARE_PROVIDER_SITE_OTHER): Payer: Commercial Managed Care - PPO | Admitting: Family Medicine

## 2015-07-05 ENCOUNTER — Encounter: Payer: Self-pay | Admitting: Family Medicine

## 2015-07-05 VITALS — BP 122/76 | HR 66 | Temp 98.0°F | Resp 16 | Ht 60.5 in | Wt 243.6 lb

## 2015-07-05 DIAGNOSIS — B09 Unspecified viral infection characterized by skin and mucous membrane lesions: Secondary | ICD-10-CM

## 2015-07-05 DIAGNOSIS — J069 Acute upper respiratory infection, unspecified: Secondary | ICD-10-CM

## 2015-07-05 NOTE — Patient Instructions (Signed)
Possibly coxsackie virus. Recommend you avoid newborns until rash is cleared up.

## 2015-07-05 NOTE — Progress Notes (Signed)
Patient: Stephen Ponce Male    DOB: 15-Apr-1946   69 y.o.   MRN: [id number] Visit Date: 07/05/2015  Today's Provider: Lelon Huh, MD   Chief Complaint  Patient presents with  . Cough  . Rash   Subjective:    HPI  Patient comes in office today with concerns of cough and congestion for the past 5 days. Patient describes cough as "wraspy"  With production of phlegm in the morning. Patient states that wife has similar symptoms and was recently diagnosed with Bronchitis. He had rash that appeared on his upper torso on his left side 3 days ago with small red spots that has now spread to patches over entire body. Patient denies itching or burning of the skin, he denies any new food, lotion, detergent or soap within the past week. He states his wife is on antibiotics and had red spots pop up in her mouth. Patient reports taking otc Sudafed in the AM to help with cold symptoms.     Allergies  Allergen Reactions  . Penicillins Itching and Swelling   Previous Medications   CYANOCOBALAMIN 1000 MCG TABLET    Place under the tongue.   GLUCOS-CHOND-STEROL-FISH OIL (GLUCOSAMINE CHONDROITIN PLUS) CAPS    Take by mouth.   KRILL OIL 1000 MG CAPS    Take by mouth.   METOPROLOL SUCCINATE (TOPROL-XL) 25 MG 24 HR TABLET    Take by mouth.   MULTIPLE VITAMIN TABLET    Take by mouth.   PYRIDOXINE HCL (VITAMIN B6) 200 MG TABS    Take by mouth.   UBIQUINOL 100 MG CAPS    Take by mouth.    Review of Systems  Constitutional: Positive for fatigue. Negative for fever, chills, diaphoresis, activity change, appetite change and unexpected weight change.  HENT: Positive for drooling, ear discharge, mouth sores, postnasal drip, sinus pressure and sore throat. Negative for congestion, dental problem, ear pain, facial swelling, hearing loss, nosebleeds, tinnitus, trouble swallowing and voice change.   Eyes: Negative.   Respiratory: Positive for cough. Negative for apnea, choking, chest tightness, shortness  of breath, wheezing and stridor.   Cardiovascular: Negative.   Gastrointestinal: Negative.   Endocrine: Negative.   Genitourinary: Negative.   Musculoskeletal: Negative.   Skin: Positive for rash.  Neurological: Positive for headaches. Negative for dizziness, tremors, seizures, syncope, speech difficulty, weakness, light-headedness and numbness.    Social History  Substance Use Topics  . Smoking status: Former Smoker    Types: Cigarettes    Quit date: 09/07/1996  . Smokeless tobacco: Never Used  . Alcohol Use: No   Objective:   BP 122/76 mmHg  Pulse 66  Temp(Src) 98 F (36.7 C) (Oral)  Resp 16  Ht 5' 0.5" (1.537 m)  Wt 243 lb 9.6 oz (110.496 kg)  BMI 46.77 kg/m2  SpO2 91%  Physical Exam  General Appearance:    Alert, cooperative, no distress  HENT:   bilateral TM normal without fluid or infection, neck without nodes, pharynx erythematous without exudate, sinuses nontender and nasal mucosa pale and congested  Eyes:    PERRL, conjunctiva/corneas clear, EOM's intact       Lungs:     Clear to auscultation bilaterally, respirations unlabored  Heart:    Regular rate and rhythm  Skin:   Scattered vague papulosquamous rash across back, chest, abdomen, upper arms, and upper legs. No lesions on face, palms or soles of feet.  Assessment & Plan:     1. Viral exanthem Likely benign. However he does have newborn grandchild that he would like to visit and I counseled him some viruses could pose serious health risks to newborns, and he should avoid close contact until URI and associated rash or cleared up, which I expect about 3-7 days.   2. Upper respiratory infection Counseled regarding signs and symptoms of viral and bacterial respiratory infections. Advised to call or return for additional evaluation if he develops any sign of bacterial infection, or if current symptoms last longer than 10 days.         Lelon Huh, MD  [facility]

## 2015-07-09 ENCOUNTER — Encounter: Payer: Self-pay | Admitting: Family Medicine

## 2015-07-24 ENCOUNTER — Other Ambulatory Visit: Payer: Self-pay | Admitting: Family Medicine

## 2015-07-27 ENCOUNTER — Other Ambulatory Visit: Payer: Self-pay | Admitting: Family Medicine

## 2015-07-29 NOTE — Telephone Encounter (Signed)
Dr. Caryn Section  Is this your patient? Thanks

## 2015-08-20 ENCOUNTER — Encounter: Payer: Commercial Managed Care - PPO | Admitting: Family Medicine

## 2015-08-22 ENCOUNTER — Ambulatory Visit (INDEPENDENT_AMBULATORY_CARE_PROVIDER_SITE_OTHER): Payer: Commercial Managed Care - PPO | Admitting: Family Medicine

## 2015-08-22 ENCOUNTER — Encounter: Payer: Self-pay | Admitting: Family Medicine

## 2015-08-22 VITALS — BP 122/64 | HR 72 | Temp 97.8°F | Resp 14 | Ht 69.25 in | Wt 251.0 lb

## 2015-08-22 DIAGNOSIS — R195 Other fecal abnormalities: Secondary | ICD-10-CM

## 2015-08-22 DIAGNOSIS — Z1211 Encounter for screening for malignant neoplasm of colon: Secondary | ICD-10-CM | POA: Diagnosis not present

## 2015-08-22 DIAGNOSIS — Z0000 Encounter for general adult medical examination without abnormal findings: Secondary | ICD-10-CM

## 2015-08-22 DIAGNOSIS — Z125 Encounter for screening for malignant neoplasm of prostate: Secondary | ICD-10-CM | POA: Diagnosis not present

## 2015-08-22 LAB — IFOBT (OCCULT BLOOD): IFOBT: POSITIVE

## 2015-08-22 LAB — POCT URINALYSIS DIPSTICK
BILIRUBIN UA: NEGATIVE
Blood, UA: NEGATIVE
GLUCOSE UA: NEGATIVE
KETONES UA: NEGATIVE
LEUKOCYTES UA: NEGATIVE
NITRITE UA: NEGATIVE
Protein, UA: NEGATIVE
Spec Grav, UA: 1.015
Urobilinogen, UA: NEGATIVE
pH, UA: 6.5

## 2015-08-22 NOTE — Progress Notes (Signed)
Patient ID: Stephen Ponce, male   DOB: [birth year], 69 y.o.   MRN: [id number]  Visit Date: 08/22/2015  Today's Provider: Wilhemena Durie, MD   Chief Complaint  Patient presents with  . Annual Exam   Subjective:  Stephen Ponce is a 69 y.o. male who presents today for health maintenance and complete physical. He feels well. He reports exercising 2 to 5 days a week. He reports he is sleeping well. Immunization History  Administered Date(s) Administered  . Pneumococcal Conjugate-13 06/28/2014  . Td 05/11/2000  . Tdap 12/07/2014  . Zoster 12/07/2014  Colonoscopy 08/09/14 tubular adenoma, repeat 5 years. He already had flu shot.  Review of Systems  Constitutional: Negative.   HENT: Negative.   Eyes: Negative.   Respiratory: Negative.   Cardiovascular: Negative.   Gastrointestinal: Negative.   Endocrine: Negative.   Genitourinary: Negative.   Musculoskeletal: Positive for arthralgias.  Skin: Negative.   Allergic/Immunologic: Negative.   Neurological: Negative.   Hematological: Negative.   Psychiatric/Behavioral: Negative.     Social History   Social History  . Marital Status: Married    Spouse Name: N/A  . Number of Children: N/A  . Years of Education: N/A   Occupational History  . Not on file.   Social History Main Topics  . Smoking status: Former Smoker -- 3.00 packs/day for 35 years    Types: Cigarettes    Quit date: 09/07/1996  . Smokeless tobacco: Never Used  . Alcohol Use: No  . Drug Use: No  . Sexual Activity: Not on file   Other Topics Concern  . Not on file   Social History Narrative    Patient Active Problem List   Diagnosis Date Noted  . 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 Kidney failure 06/28/2015  . 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 Current tobacco use 03/17/2007    Past Surgical History  Procedure Laterality Date  . Osteotomy      His family history includes Bone cancer in his brother; CAD in  his father; Lung cancer in his brother; Stomach cancer in his brother.    Outpatient Prescriptions Prior to Visit  Medication Sig Dispense Refill  . cyanocobalamin 1000 MCG tablet Place under the tongue.    . Glucos-Chond-Sterol-Fish Oil (GLUCOSAMINE CHONDROITIN PLUS) CAPS Take by mouth.    Javier Docker Oil 1000 MG CAPS Take by mouth.    . metoprolol succinate (TOPROL-XL) 25 MG 24 hr tablet TAKE 1/2 TABLET BY MOUTH ONCE A DAY 15 tablet 7  . Multiple Vitamin tablet Take by mouth.    . Pyridoxine HCl (VITAMIN B6) 200 MG TABS Take by mouth.    . Ubiquinol 100 MG CAPS Take by mouth.     No facility-administered medications prior to visit.    Patient Care Team: Jerrol Banana., MD as PCP - General (Family Medicine)     Objective:   Vitals:  Filed Vitals:   08/22/15 0943  BP: 122/64  Pulse: 72  Temp: 97.8 F (36.6 C)  Resp: 14  Height: 5' 9.25" (1.759 m)  Weight: 251 lb (113.853 kg)    Physical Exam  Constitutional: He is oriented to person, place, and time. He appears well-developed and well-nourished.  HENT:  Head: Normocephalic and atraumatic.  Right Ear: External ear normal.  Left Ear: External ear normal.  Mouth/Throat: Oropharynx is clear and moist.  Eyes: Conjunctivae are normal. Pupils are equal, round, and reactive to light.  Neck: Normal range of  motion. Neck supple.  Cardiovascular: Normal rate, regular rhythm, normal heart sounds and intact distal pulses.   No murmur heard. Pulmonary/Chest: Effort normal and breath sounds normal. No respiratory distress. He has no wheezes.  Abdominal: Soft. Bowel sounds are normal. There is no tenderness. There is no rebound.  Genitourinary: Prostate normal and penis normal. Guaiac positive stool. No penile tenderness.  Musculoskeletal: He exhibits edema. He exhibits no tenderness.  1+ LE edema.  Neurological: He is alert and oriented to person, place, and time.  Skin: Skin is warm and dry. No rash noted. No erythema.   Psychiatric: He has a normal mood and affect. His behavior is normal. Judgment and thought content normal.     Depression Screen PHQ 2/9 Scores 08/22/2015  PHQ - 2 Score 0      Assessment & Plan:   1. Annual physical exam - CBC with Differential/Platelet - Comprehensive metabolic panel - Lipid Panel With LDL/HDL Ratio - TSH - POCT urinalysis dipstick  2. Colon cancer screening Stool sample positive for blood today. Will go ahead and refer to GI. - IFOBT POC (occult bld, rslt in office)  3. Prostate cancer screening - PSA    Patient was seen and examined by Dr. Eulas Post and note was scribed by Theressa Millard, RMA.

## 2015-08-24 LAB — CBC WITH DIFFERENTIAL/PLATELET
BASOS: 1 %
Basophils Absolute: 0.1 10*3/uL (ref 0.0–0.2)
EOS (ABSOLUTE): 0.4 10*3/uL (ref 0.0–0.4)
Eos: 7 %
HEMATOCRIT: 45.7 % (ref 37.5–51.0)
Hemoglobin: 15.9 g/dL (ref 12.6–17.7)
IMMATURE GRANS (ABS): 0 10*3/uL (ref 0.0–0.1)
Immature Granulocytes: 0 %
Lymphocytes Absolute: 1.7 10*3/uL (ref 0.7–3.1)
Lymphs: 26 %
MCH: 31.4 pg (ref 26.6–33.0)
MCHC: 34.8 g/dL (ref 31.5–35.7)
MCV: 90 fL (ref 79–97)
MONOS ABS: 0.7 10*3/uL (ref 0.1–0.9)
Monocytes: 11 %
NEUTROS ABS: 3.6 10*3/uL (ref 1.4–7.0)
Neutrophils: 55 %
PLATELETS: 228 10*3/uL (ref 150–379)
RBC: 5.06 x10E6/uL (ref 4.14–5.80)
RDW: 13.3 % (ref 12.3–15.4)
WBC: 6.4 10*3/uL (ref 3.4–10.8)

## 2015-08-24 LAB — TSH: TSH: 1.79 u[IU]/mL (ref 0.450–4.500)

## 2015-08-24 LAB — COMPREHENSIVE METABOLIC PANEL
A/G RATIO: 1.5 (ref 1.1–2.5)
ALT: 38 IU/L (ref 0–44)
AST: 27 IU/L (ref 0–40)
Albumin: 4.2 g/dL (ref 3.6–4.8)
Alkaline Phosphatase: 47 IU/L (ref 39–117)
BUN/Creatinine Ratio: 21 (ref 10–22)
BUN: 16 mg/dL (ref 8–27)
Bilirubin Total: 0.9 mg/dL (ref 0.0–1.2)
CALCIUM: 9.3 mg/dL (ref 8.6–10.2)
CO2: 22 mmol/L (ref 18–29)
CREATININE: 0.78 mg/dL (ref 0.76–1.27)
Chloride: 101 mmol/L (ref 96–106)
GFR, EST AFRICAN AMERICAN: 106 mL/min/{1.73_m2} (ref >59)
GFR, EST NON AFRICAN AMERICAN: 92 mL/min/{1.73_m2} (ref >59)
Globulin, Total: 2.8 g/dL (ref 1.5–4.5)
Glucose: 99 mg/dL (ref 65–99)
Potassium: 4.9 mmol/L (ref 3.5–5.2)
Sodium: 139 mmol/L (ref 134–144)
TOTAL PROTEIN: 7 g/dL (ref 6.0–8.5)

## 2015-08-24 LAB — LIPID PANEL WITH LDL/HDL RATIO
Cholesterol, Total: 151 mg/dL (ref 100–199)
HDL: 36 mg/dL — AB (ref >39)
LDL CALC: 89 mg/dL (ref 0–99)
LDL/HDL RATIO: 2.5 ratio (ref 0.0–3.6)
TRIGLYCERIDES: 132 mg/dL (ref 0–149)
VLDL CHOLESTEROL CAL: 26 mg/dL (ref 5–40)

## 2015-08-24 LAB — PSA: Prostate Specific Ag, Serum: 1 ng/mL (ref 0.0–4.0)

## 2015-09-18 ENCOUNTER — Encounter: Payer: Self-pay | Admitting: Gastroenterology

## 2015-09-18 ENCOUNTER — Ambulatory Visit (INDEPENDENT_AMBULATORY_CARE_PROVIDER_SITE_OTHER): Payer: Commercial Managed Care - PPO | Admitting: Gastroenterology

## 2015-09-18 VITALS — BP 147/84 | HR 68 | Temp 98.3°F | Ht 69.0 in | Wt 242.0 lb

## 2015-09-18 DIAGNOSIS — R195 Other fecal abnormalities: Secondary | ICD-10-CM

## 2015-09-18 NOTE — Progress Notes (Signed)
Primary Care Physician: Wilhemena Durie, MD  Primary Gastroenterologist:  Dr. Lucilla Lame  Chief Complaint  Patient presents with  . Blood In Stools    HPI: Stephen Ponce is a 70 y.o. male here for occult blood positive stools. The patient had a colonoscopy approximately 13 months ago that showed a few adenomas in the colon. The patient denies any dyspepsia and nausea vomiting fevers or chills. He also reports that he has not had any unexplained weight loss. There is no report of any heartburn or dyspepsia. The patient also denies early satiety. The patient's most recent lab work showed his hemoglobin to be normal and his MCV to be normal.  Current Outpatient Prescriptions  Medication Sig Dispense Refill  . Ascorbic Acid (VITAMIN C) 100 MG tablet Take 100 mg by mouth daily.    . cyanocobalamin 1000 MCG tablet Place under the tongue.    . Glucos-Chond-Sterol-Fish Oil (GLUCOSAMINE CHONDROITIN PLUS) CAPS Take by mouth.    Javier Docker Oil 1000 MG CAPS Take by mouth.    . metoprolol succinate (TOPROL-XL) 25 MG 24 hr tablet TAKE 1/2 TABLET BY MOUTH ONCE A DAY 15 tablet 7  . Multiple Vitamin tablet Take by mouth.    . Pyridoxine HCl (VITAMIN B6) 200 MG TABS Take by mouth.    . Ubiquinol 100 MG CAPS Take by mouth.     No current facility-administered medications for this visit.    Allergies as of 09/18/2015 - Review Complete 09/18/2015  Allergen Reaction Noted  . Penicillins Itching and Swelling 06/28/2015    ROS:  General: Negative for anorexia, weight loss, fever, chills, fatigue, weakness. ENT: Negative for hoarseness, difficulty swallowing , nasal congestion. CV: Negative for chest pain, angina, palpitations, dyspnea on exertion, peripheral edema.  Respiratory: Negative for dyspnea at rest, dyspnea on exertion, cough, sputum, wheezing.  GI: See history of present illness. GU:  Negative for dysuria, hematuria, urinary incontinence, urinary frequency, nocturnal urination.  Endo:  Negative for unusual weight change.    Physical Examination:   BP 147/84 mmHg  Pulse 68  Temp(Src) 98.3 F (36.8 C) (Oral)  Ht 5\' 9"  (1.753 m)  Wt 242 lb (109.77 kg)  BMI 35.72 kg/m2  General: Well-nourished, well-developed in no acute distress.  Eyes: No icterus. Conjunctivae pink. Mouth: Oropharyngeal mucosa moist and pink , no lesions erythema or exudate. Lungs: Clear to auscultation bilaterally. Non-labored. Heart: Regular rate and rhythm, no murmurs rubs or gallops.  Abdomen: Bowel sounds are normal, nontender, nondistended, no hepatosplenomegaly or masses, no abdominal bruits or hernia , no rebound or guarding.   Extremities: No lower extremity edema. No clubbing or deformities. Neuro: Alert and oriented x 3.  Grossly intact. Skin: Warm and dry, no jaundice.   Psych: Alert and cooperative, normal mood and affect.  Labs:    Imaging Studies: No results found.  Assessment and Plan:   Stephen Ponce is a 53 y.o. y/o male who comes in with heme positive stools after having a colonoscopy 13 months ago with a few small adenomatous polyps. The patient was recommended to have a repeat colonoscopy in 5 years due to those findings. Due to the patient having the colonoscopy only 13 months ago I would not advise repeating the colonoscopy. As far as an upper endoscopy or small bowel investigation for his heme positive stools the patient has not had any indices and his CBC to suggest anemia or iron deficiency with a normal hemoglobin and MCV. The patient has been  explained that the most likely cause of his heme positive stools or hemorrhoids and he agrees with no further workup at this time. If the patient should develop anemia or iron deficiency then he has been told that we will reconsider a further workup. The patient agrees with the plan and will follow up as needed.   Note: This dictation was prepared with Dragon dictation along with smaller phrase technology. Any transcriptional  errors that result from this process are unintentional.

## 2016-03-19 ENCOUNTER — Other Ambulatory Visit: Payer: Self-pay

## 2016-03-19 MED ORDER — METOPROLOL SUCCINATE ER 25 MG PO TB24: ORAL_TABLET | ORAL | Status: DC

## 2016-08-10 ENCOUNTER — Ambulatory Visit: Payer: Commercial Managed Care - PPO | Admitting: Physician Assistant

## 2016-08-24 ENCOUNTER — Encounter: Payer: Self-pay | Admitting: Family Medicine

## 2016-08-24 ENCOUNTER — Ambulatory Visit (INDEPENDENT_AMBULATORY_CARE_PROVIDER_SITE_OTHER): Payer: Commercial Managed Care - PPO | Admitting: Family Medicine

## 2016-08-24 VITALS — BP 148/72 | HR 80 | Temp 98.8°F | Resp 14 | Ht 69.0 in | Wt 243.0 lb

## 2016-08-24 DIAGNOSIS — Z125 Encounter for screening for malignant neoplasm of prostate: Secondary | ICD-10-CM | POA: Diagnosis not present

## 2016-08-24 DIAGNOSIS — Z0000 Encounter for general adult medical examination without abnormal findings: Secondary | ICD-10-CM | POA: Diagnosis not present

## 2016-08-24 DIAGNOSIS — Z23 Encounter for immunization: Secondary | ICD-10-CM | POA: Diagnosis not present

## 2016-08-24 DIAGNOSIS — G5603 Carpal tunnel syndrome, bilateral upper limbs: Secondary | ICD-10-CM

## 2016-08-24 DIAGNOSIS — Z1211 Encounter for screening for malignant neoplasm of colon: Secondary | ICD-10-CM

## 2016-08-24 DIAGNOSIS — R0681 Apnea, not elsewhere classified: Secondary | ICD-10-CM

## 2016-08-24 DIAGNOSIS — R195 Other fecal abnormalities: Secondary | ICD-10-CM

## 2016-08-24 LAB — POCT URINALYSIS DIPSTICK
Bilirubin, UA: NEGATIVE
Blood, UA: NEGATIVE
GLUCOSE UA: NEGATIVE
KETONES UA: NEGATIVE
LEUKOCYTES UA: NEGATIVE
Nitrite, UA: NEGATIVE
Protein, UA: NEGATIVE
SPEC GRAV UA: 1.015
Urobilinogen, UA: 0.2
pH, UA: 6.5

## 2016-08-24 LAB — IFOBT (OCCULT BLOOD): IFOBT: POSITIVE

## 2016-08-24 MED ORDER — NAPROXEN 500 MG PO TABS: 500.0000 mg | ORAL_TABLET | Freq: Two times a day (BID) | ORAL | 2 refills | Status: DC

## 2016-08-24 NOTE — Patient Instructions (Signed)
Try cock up wrist splint you can get at a store. Try naproxen 2 times daily.

## 2016-08-24 NOTE — Progress Notes (Signed)
Patient: Stephen Ponce, Male    DOB: March 15, 1946, 70 y.o.   MRN: [id number] Visit Date: 08/24/2016  Today's Provider: Wilhemena Durie, MD   Chief Complaint  Patient presents with  . Annual Exam   Subjective:  Stephen Ponce is a 70 y.o. male who presents today for health maintenance and complete physical. He feels fairly well. He reports exercising not at this time for the past 3 weeks due to battling congestion and cough. He reports he is sleeping fairly well due to cold symptoms he is having. He is married and the father of 2 daughters, he has 4 grandchildren. He snores nightly and his wife says he has apneic spells. He scores a 10 on Epworth. He has a lot of daytime sleepiness. Recently he has had numbness and tingling of both hands excluding the fifth finger. No obvious weakness.   Immunization History  Administered Date(s) Administered  . Pneumococcal Conjugate-13 06/28/2014  . Td 05/11/2000  . Tdap 12/07/2014  . Zoster 12/07/2014   Last colonoscopy 08/09/14 tubular adenoma  Review of Systems  Constitutional: Negative.   HENT: Positive for congestion.   Eyes: Negative.   Respiratory: Positive for cough.   Cardiovascular: Negative.   Gastrointestinal: Negative.   Endocrine: Negative.   Genitourinary: Negative.   Musculoskeletal: Positive for arthralgias.  Skin: Negative.   Allergic/Immunologic: Negative.   Neurological: Negative.   Hematological: Negative.   Psychiatric/Behavioral: Negative.     Social History   Social History  . Marital status: Married    Spouse name: N/A  . Number of children: N/A  . Years of education: N/A   Occupational History  . Not on file.   Social History Main Topics  . Smoking status: Former Smoker    Packs/day: 3.00    Years: 35.00    Types: Cigarettes    Quit date: 09/07/1996  . Smokeless tobacco: Never Used  . Alcohol use No  . Drug use: No  . Sexual activity: Not on file   Other Topics Concern  . Not on file    Social History Narrative  . No narrative on file    Patient Active Problem List   Diagnosis Date Noted  . 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 Kidney failure 06/28/2015  . 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 Current tobacco use 03/17/2007    Past Surgical History:  Procedure Laterality Date  . COLONOSCOPY  08/09/2014   tubular adenoma  . OSTEOTOMY      His family history includes Bone cancer in his brother; CAD in his father; Lung cancer in his brother; Stomach cancer in his brother.     Outpatient Encounter Prescriptions as of 08/24/2016  Medication Sig Note  . Ascorbic Acid (VITAMIN C) 100 MG tablet Take 100 mg by mouth daily.   . cyanocobalamin 1000 MCG tablet Place under the tongue. 06/28/2015: Received from: [facility]  . Glucos-Chond-Sterol-Fish Oil (GLUCOSAMINE CHONDROITIN PLUS) CAPS Take by mouth. 06/28/2015: Received from: [facility]  . Krill Oil 1000 MG CAPS Take by mouth. 06/28/2015: Received from: [facility]  . metoprolol succinate (TOPROL-XL) 25 MG 24 hr tablet Take 1/2 tablet daily   . Multiple Vitamin tablet Take by mouth. 06/28/2015: Received from: [facility]  . Pyridoxine HCl (VITAMIN B6) 200 MG TABS Take by mouth. 06/28/2015: Received from: [facility]  . Ubiquinol 100 MG CAPS Take by mouth. 06/28/2015: Received from: [facility]   No facility-administered encounter medications  on file as of 08/24/2016.     Patient Care Team: Jerrol Banana., MD as PCP - General (Family Medicine)      Objective:   Vitals:  Vitals:   08/24/16 0914  BP: (!) 148/72  Pulse: 80  Resp: 14  Temp: 98.8 F (37.1 C)  Weight: 243 lb (110.2 kg)  Height: 5\' 9"  (1.753 m)    Physical Exam  Constitutional: He is oriented to person, place, and time. He appears well-developed and well-nourished.  HENT:  Head: Normocephalic  and atraumatic.  Right Ear: External ear normal.  Left Ear: External ear normal.  Mouth/Throat: Oropharynx is clear and moist.  Eyes: Conjunctivae are normal. Pupils are equal, round, and reactive to light.  Neck: Normal range of motion. Neck supple.  Cardiovascular: Normal rate, regular rhythm, normal heart sounds and intact distal pulses.   No murmur heard. Pulmonary/Chest: Effort normal and breath sounds normal. No respiratory distress. He has no wheezes.  Abdominal: Soft. He exhibits no distension. There is no tenderness.  Genitourinary: Rectum normal, prostate normal and penis normal. Rectal exam shows guaiac negative stool. No penile tenderness.  Musculoskeletal: He exhibits no edema or tenderness.  Neurological: He is alert and oriented to person, place, and time.  Positive Tinel sign bilaterally  Skin: No rash noted. No erythema.  Psychiatric: He has a normal mood and affect. His behavior is normal. Judgment and thought content normal.   Depression Screen PHQ 2/9 Scores 08/24/2016 08/22/2015  PHQ - 2 Score 0 0   Assessment & Plan:    1. Annual physical exam Epworth score today is 10. - POCT urinalysis dipstick  2. Colon cancer screening - IFOBT POC (occult bld, rslt in office)-positive today, re check in 3 months  3. Need for pneumococcal vaccination administered today.  4. Carpal tunnel syndrome, bilateral Try naproxen. Try Cock up wrist splint.May need referral to orthopedics/hand surgery.  5. Snoring/OSA Apnea witnessed, day time fatigue. Set up sleep study.Epworth 10. I think the patient will have sleep apnea and will probably benefit from CPAP. 6.URI Resolving with conservative therapy. 7. Mild obesity 8. Hypogonadism 9.ED   HPI, Exam and A&P transcribed under direction and in the presence of Miguel Aschoff, MD. I have done the exam and reviewed the chart and it is accurate to the best of my knowledge. Development worker, community has been used and  any errors in  dictation or transcription are unintentional. Miguel Aschoff M.D. [facility]

## 2016-08-27 ENCOUNTER — Telehealth: Payer: Self-pay

## 2016-08-27 LAB — COMPREHENSIVE METABOLIC PANEL
A/G RATIO: 1.6 (ref 1.2–2.2)
ALBUMIN: 4.3 g/dL (ref 3.5–4.8)
ALT: 37 IU/L (ref 0–44)
AST: 28 IU/L (ref 0–40)
Alkaline Phosphatase: 53 IU/L (ref 39–117)
BILIRUBIN TOTAL: 0.8 mg/dL (ref 0.0–1.2)
BUN / CREAT RATIO: 20 (ref 10–24)
BUN: 15 mg/dL (ref 8–27)
CALCIUM: 9.5 mg/dL (ref 8.6–10.2)
CO2: 21 mmol/L (ref 18–29)
Chloride: 102 mmol/L (ref 96–106)
Creatinine, Ser: 0.74 mg/dL — ABNORMAL LOW (ref 0.76–1.27)
GFR, EST AFRICAN AMERICAN: 108 mL/min/{1.73_m2} (ref >59)
GFR, EST NON AFRICAN AMERICAN: 93 mL/min/{1.73_m2} (ref >59)
GLOBULIN, TOTAL: 2.7 g/dL (ref 1.5–4.5)
Glucose: 102 mg/dL — ABNORMAL HIGH (ref 65–99)
POTASSIUM: 4.8 mmol/L (ref 3.5–5.2)
SODIUM: 140 mmol/L (ref 134–144)
TOTAL PROTEIN: 7 g/dL (ref 6.0–8.5)

## 2016-08-27 LAB — CBC WITH DIFFERENTIAL/PLATELET
BASOS: 1 %
Basophils Absolute: 0.1 10*3/uL (ref 0.0–0.2)
EOS (ABSOLUTE): 0.3 10*3/uL (ref 0.0–0.4)
EOS: 5 %
HEMATOCRIT: 46 % (ref 37.5–51.0)
Hemoglobin: 15.8 g/dL (ref 13.0–17.7)
IMMATURE GRANS (ABS): 0 10*3/uL (ref 0.0–0.1)
IMMATURE GRANULOCYTES: 0 %
LYMPHS: 22 %
Lymphocytes Absolute: 1.3 10*3/uL (ref 0.7–3.1)
MCH: 31 pg (ref 26.6–33.0)
MCHC: 34.3 g/dL (ref 31.5–35.7)
MCV: 90 fL (ref 79–97)
Monocytes Absolute: 0.6 10*3/uL (ref 0.1–0.9)
Monocytes: 10 %
NEUTROS ABS: 3.7 10*3/uL (ref 1.4–7.0)
NEUTROS PCT: 62 %
Platelets: 270 10*3/uL (ref 150–379)
RBC: 5.09 x10E6/uL (ref 4.14–5.80)
RDW: 13.2 % (ref 12.3–15.4)
WBC: 6 10*3/uL (ref 3.4–10.8)

## 2016-08-27 LAB — LIPID PANEL WITH LDL/HDL RATIO
Cholesterol, Total: 155 mg/dL (ref 100–199)
HDL: 39 mg/dL — AB (ref >39)
LDL Calculated: 92 mg/dL (ref 0–99)
LDL/HDL RATIO: 2.4 ratio (ref 0.0–3.6)
Triglycerides: 122 mg/dL (ref 0–149)
VLDL Cholesterol Cal: 24 mg/dL (ref 5–40)

## 2016-08-27 LAB — PSA: Prostate Specific Ag, Serum: 1.2 ng/mL (ref 0.0–4.0)

## 2016-08-27 LAB — TSH: TSH: 1.5 u[IU]/mL (ref 0.450–4.500)

## 2016-08-27 NOTE — Telephone Encounter (Signed)
Patient advised as below.  

## 2016-08-27 NOTE — Telephone Encounter (Signed)
-----   Message from Jerrol Banana., MD sent at 08/27/2016  9:32 AM EST ----- Labs stable

## 2016-09-18 ENCOUNTER — Other Ambulatory Visit: Payer: Self-pay

## 2016-09-18 NOTE — Telephone Encounter (Signed)
Refill request from [facility] for naproxen for 90 day supply-aa

## 2016-09-19 MED ORDER — NAPROXEN 500 MG PO TABS: 500.0000 mg | ORAL_TABLET | Freq: Two times a day (BID) | ORAL | 1 refills | Status: DC

## 2016-09-21 ENCOUNTER — Telehealth: Payer: Self-pay | Admitting: Family Medicine

## 2016-09-21 NOTE — Telephone Encounter (Signed)
Per Ria Comment at [facility] pt refused appointment for sleep study

## 2016-09-21 NOTE — Telephone Encounter (Signed)
[id number] 

## 2016-10-09 ENCOUNTER — Other Ambulatory Visit: Payer: Self-pay | Admitting: Family Medicine

## 2016-10-14 DIAGNOSIS — C4491 Basal cell carcinoma of skin, unspecified: Secondary | ICD-10-CM

## 2016-10-14 DIAGNOSIS — C4492 Squamous cell carcinoma of skin, unspecified: Secondary | ICD-10-CM

## 2016-10-14 HISTORY — DX: Squamous cell carcinoma of skin, unspecified: C44.92

## 2016-10-14 HISTORY — DX: Basal cell carcinoma of skin, unspecified: C44.91

## 2016-11-24 ENCOUNTER — Ambulatory Visit: Payer: Commercial Managed Care - PPO | Admitting: Family Medicine

## 2017-04-07 ENCOUNTER — Other Ambulatory Visit: Payer: Self-pay | Admitting: Family Medicine

## 2017-07-03 ENCOUNTER — Other Ambulatory Visit: Payer: Self-pay | Admitting: Family Medicine

## 2017-08-24 ENCOUNTER — Encounter: Payer: Self-pay | Admitting: Family Medicine

## 2017-08-24 ENCOUNTER — Ambulatory Visit (INDEPENDENT_AMBULATORY_CARE_PROVIDER_SITE_OTHER): Payer: Commercial Managed Care - PPO | Admitting: Family Medicine

## 2017-08-24 VITALS — BP 130/72 | HR 66 | Temp 97.8°F | Resp 16 | Ht 69.0 in | Wt 243.0 lb

## 2017-08-24 DIAGNOSIS — Z1159 Encounter for screening for other viral diseases: Secondary | ICD-10-CM | POA: Diagnosis not present

## 2017-08-24 DIAGNOSIS — M1712 Unilateral primary osteoarthritis, left knee: Secondary | ICD-10-CM

## 2017-08-24 DIAGNOSIS — Z125 Encounter for screening for malignant neoplasm of prostate: Secondary | ICD-10-CM | POA: Diagnosis not present

## 2017-08-24 DIAGNOSIS — Z0000 Encounter for general adult medical examination without abnormal findings: Secondary | ICD-10-CM

## 2017-08-24 NOTE — Progress Notes (Signed)
Patient: Stephen Ponce, Male    DOB: [birth year], 71 y.o.   MRN: [id number] Visit Date: 08/24/2017  Today's Provider: Wilhemena Durie, MD   Chief Complaint  Patient presents with  . Annual Exam   Subjective:    Annual physical exam Stephen Ponce is a 71 y.o. male who presents today for health maintenance and complete physical. He feels well. He reports exercising 3-5 times a week. He reports he is sleeping well.  ----------------------------------------------------------------- Colonoscopy- 08/09/14 Tubular adenoma    Review of Systems  Constitutional: Negative.   HENT: Positive for sinus pressure.   Eyes: Negative.   Respiratory: Negative.   Cardiovascular: Negative.   Gastrointestinal: Negative.   Endocrine: Negative.   Genitourinary: Positive for frequency.  Musculoskeletal: Positive for arthralgias and myalgias.  Skin: Negative.   Allergic/Immunologic: Negative.   Neurological: Negative.   Hematological: Negative.   Psychiatric/Behavioral: Negative.     Social History      He  reports that he quit smoking about 20 years ago. His smoking use included cigarettes. He has a 105.00 pack-year smoking history. he has never used smokeless tobacco. He reports that he drinks alcohol. He reports that he does not use drugs.       Social History   Socioeconomic History  . Marital status: Married    Spouse name: None  . Number of children: None  . Years of education: None  . Highest education level: None  Social Needs  . Financial resource strain: None  . Food insecurity - worry: None  . Food insecurity - inability: None  . Transportation needs - medical: None  . Transportation needs - non-medical: None  Occupational History  . None  Tobacco Use  . Smoking status: Former Smoker    Packs/day: 3.00    Years: 35.00    Pack years: 105.00    Types: Cigarettes    Last attempt to quit: 09/07/1996    Years since quitting: 20.9  . Smokeless tobacco: Never  Used  Substance and Sexual Activity  . Alcohol use: Yes    Alcohol/week: 0.0 oz    Comment: ocassionally  . Drug use: No  . Sexual activity: None  Other Topics Concern  . None  Social History Narrative  . None    Past Medical History:  Diagnosis Date  . Hypertension      Patient Active Problem List   Diagnosis Date Noted  . 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 Kidney failure 06/28/2015  . 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 Current tobacco use 03/17/2007    Past Surgical History:  Procedure Laterality Date  . COLONOSCOPY  08/09/2014   tubular adenoma  . OSTEOTOMY      Family History        Family Status  Relation Name Status  . Father  Deceased  . Mother  Alive  . Sister  Alive  . Brother  Alive  . Sister  Alive  . Sister  Alive  . Brother  Alive  . Brother  Alive        His family history includes Bone cancer in his brother; CAD in his father; Lung cancer in his brother; Stomach cancer in his brother.     Allergies  Allergen Reactions  . Penicillins Itching and Swelling     Current Outpatient Medications:  .  Ascorbic Acid (VITAMIN C) 100 MG tablet, Take 100 mg by mouth daily., Disp: , Rfl:  .  cyanocobalamin 1000 MCG tablet, Place under the tongue., Disp: , Rfl:  .  Glucos-Chond-Sterol-Fish Oil (GLUCOSAMINE CHONDROITIN PLUS) CAPS, Take by mouth., Disp: , Rfl:  .  Krill Oil 1000 MG CAPS, Take by mouth., Disp: , Rfl:  .  metoprolol succinate (TOPROL-XL) 25 MG 24 hr tablet, TAKE 1/2 TABLET BY MOUTH DAILY, Disp: 45 tablet, Rfl: 3 .  Multiple Vitamin tablet, Take by mouth., Disp: , Rfl:  .  naproxen (NAPROSYN) 500 MG tablet, TAKE 1 TABLET (500 MG TOTAL) BY MOUTH 2 (TWO) TIMES DAILY WITH A MEAL., Disp: 180 tablet, Rfl: 0 .  Pyridoxine HCl (VITAMIN B6) 200 MG TABS, Take by mouth., Disp: , Rfl:  .  Ubiquinol 100 MG CAPS, Take by mouth daily., Disp: , Rfl:    Patient Care Team: Jerrol Banana., MD as PCP - General  (Family Medicine)      Objective:   Vitals: BP 130/72 (BP Location: Left Arm, Patient Position: Sitting, Cuff Size: Large)   Pulse 66   Temp 97.8 F (36.6 C) (Oral)   Resp 16   Ht 5\' 9"  (1.753 m)   Wt 243 lb (110.2 kg)   BMI 35.88 kg/m    Vitals:   08/24/17 0925  BP: 130/72  Pulse: 66  Resp: 16  Temp: 97.8 F (36.6 C)  TempSrc: Oral  Weight: 243 lb (110.2 kg)  Height: 5\' 9"  (1.753 m)     Physical Exam  Constitutional: He is oriented to person, place, and time. He appears well-developed and well-nourished.  HENT:  Head: Normocephalic and atraumatic.  Right Ear: External ear normal.  Left Ear: External ear normal.  Nose: Nose normal.  Mouth/Throat: Oropharynx is clear and moist.  Eyes: Conjunctivae are normal. No scleral icterus.  Neck: Neck supple. No thyromegaly present.  Cardiovascular: Normal rate, regular rhythm, normal heart sounds and intact distal pulses.  Pulmonary/Chest: Effort normal and breath sounds normal.  Abdominal: Soft.  Neurological: He is alert and oriented to person, place, and time.  Skin: Skin is warm and dry.  Psychiatric: He has a normal mood and affect. His behavior is normal. Judgment and thought content normal.     Depression Screen PHQ 2/9 Scores 08/24/2017 08/24/2016 08/22/2015  PHQ - 2 Score 0 0 0      Assessment & Plan:     Routine Health Maintenance and Physical Exam  Exercise Activities and Dietary recommendations Goals    None      Immunization History  Administered Date(s) Administered  . Pneumococcal Conjugate-13 06/28/2014  . Pneumococcal Polysaccharide-23 08/24/2016  . Td 05/11/2000  . Tdap 12/07/2014  . Zoster 12/07/2014    Health Maintenance  Topic Date Due  . Hepatitis C Screening  August 26, 1946  . INFLUENZA VACCINE  04/07/2018 (Originally 04/07/2017)  . COLONOSCOPY  08/10/2019  . TETANUS/TDAP  12/06/2024  . PNA vac Low Risk Adult  Completed     Discussed health benefits of physical activity, and  encouraged him to engage in regular exercise appropriate for his age and condition.  Knee OA/Trigger Finger Refer to Dr Sabra Heck. Hypogonadism Per Dr Yves Dill.   --------------------------------------------------------------------   I have done the exam and reviewed the above chart and it is accurate to the best of my knowledge. Development worker, community has been used in this note in any air is in the dictation or transcription are unintentional.  Wilhemena Durie, MD  [facility]

## 2017-08-25 LAB — HEPATITIS C ANTIBODY
HEP C AB: NONREACTIVE
SIGNAL TO CUT-OFF: 0.06 (ref <1.00)

## 2017-08-25 LAB — COMPLETE METABOLIC PANEL WITH GFR
AG Ratio: 1.4 (calc) (ref 1.0–2.5)
ALBUMIN MSPROF: 4 g/dL (ref 3.6–5.1)
ALT: 38 U/L (ref 9–46)
AST: 28 U/L (ref 10–35)
Alkaline phosphatase (APISO): 44 U/L (ref 40–115)
BILIRUBIN TOTAL: 1.2 mg/dL (ref 0.2–1.2)
BUN / CREAT RATIO: 24 (calc) — AB (ref 6–22)
BUN: 15 mg/dL (ref 7–25)
CALCIUM: 9.3 mg/dL (ref 8.6–10.3)
CO2: 24 mmol/L (ref 20–32)
CREATININE: 0.63 mg/dL — AB (ref 0.70–1.18)
Chloride: 104 mmol/L (ref 98–110)
GFR, EST AFRICAN AMERICAN: 115 mL/min/{1.73_m2} (ref >=60)
GFR, EST NON AFRICAN AMERICAN: 99 mL/min/{1.73_m2} (ref >=60)
GLUCOSE: 96 mg/dL (ref 65–99)
Globulin: 2.9 g/dL (calc) (ref 1.9–3.7)
Potassium: 4.4 mmol/L (ref 3.5–5.3)
Sodium: 137 mmol/L (ref 135–146)
TOTAL PROTEIN: 6.9 g/dL (ref 6.1–8.1)

## 2017-08-25 LAB — CBC WITH DIFFERENTIAL/PLATELET
BASOS ABS: 57 {cells}/uL (ref 0–200)
Basophils Relative: 1 %
EOS PCT: 6.6 %
Eosinophils Absolute: 376 cells/uL (ref 15–500)
HEMATOCRIT: 47.8 % (ref 38.5–50.0)
Hemoglobin: 16.2 g/dL (ref 13.2–17.1)
Lymphs Abs: 1243 cells/uL (ref 850–3900)
MCH: 30.4 pg (ref 27.0–33.0)
MCHC: 33.9 g/dL (ref 32.0–36.0)
MCV: 89.7 fL (ref 80.0–100.0)
MONOS PCT: 11.7 %
MPV: 11.1 fL (ref 7.5–12.5)
NEUTROS PCT: 58.9 %
Neutro Abs: 3357 cells/uL (ref 1500–7800)
PLATELETS: 219 10*3/uL (ref 140–400)
RBC: 5.33 10*6/uL (ref 4.20–5.80)
RDW: 12.1 % (ref 11.0–15.0)
TOTAL LYMPHOCYTE: 21.8 %
WBC mixed population: 667 cells/uL (ref 200–950)
WBC: 5.7 10*3/uL (ref 3.8–10.8)

## 2017-08-25 LAB — PSA: PSA: 1 ng/mL (ref <=4.0)

## 2017-08-25 LAB — TSH: TSH: 1.33 mIU/L (ref 0.40–4.50)

## 2017-08-26 NOTE — Progress Notes (Signed)
Advised  ED 

## 2017-09-20 ENCOUNTER — Encounter: Payer: Self-pay | Admitting: Family Medicine

## 2017-09-28 ENCOUNTER — Other Ambulatory Visit: Payer: Self-pay | Admitting: Family Medicine

## 2017-12-21 ENCOUNTER — Other Ambulatory Visit: Payer: Self-pay | Admitting: Family Medicine

## 2018-02-14 ENCOUNTER — Emergency Department
Admission: EM | Admit: 2018-02-14 | Discharge: 2018-02-14 | Disposition: A | Discharge status: Home / Self Care | Payer: PRIVATE HEALTH INSURANCE | Attending: Student in an Organized Health Care Education/Training Program | Admitting: Student in an Organized Health Care Education/Training Program

## 2018-02-14 ENCOUNTER — Emergency Department: Payer: PRIVATE HEALTH INSURANCE

## 2018-02-14 ENCOUNTER — Other Ambulatory Visit: Payer: Self-pay

## 2018-02-14 ENCOUNTER — Encounter: Payer: Self-pay | Admitting: Emergency Medicine

## 2018-02-14 DIAGNOSIS — Y92410 Unspecified street and highway as the place of occurrence of the external cause: Secondary | ICD-10-CM | POA: Diagnosis not present

## 2018-02-14 DIAGNOSIS — Z79899 Other long term (current) drug therapy: Secondary | ICD-10-CM | POA: Insufficient documentation

## 2018-02-14 DIAGNOSIS — S46912A Strain of unspecified muscle, fascia and tendon at shoulder and upper arm level, left arm, initial encounter: Secondary | ICD-10-CM | POA: Diagnosis not present

## 2018-02-14 DIAGNOSIS — Y998 Other external cause status: Secondary | ICD-10-CM | POA: Diagnosis not present

## 2018-02-14 DIAGNOSIS — S161XXA Strain of muscle, fascia and tendon at neck level, initial encounter: Secondary | ICD-10-CM | POA: Diagnosis not present

## 2018-02-14 DIAGNOSIS — M25562 Pain in left knee: Secondary | ICD-10-CM | POA: Diagnosis not present

## 2018-02-14 DIAGNOSIS — Z87891 Personal history of nicotine dependence: Secondary | ICD-10-CM | POA: Diagnosis not present

## 2018-02-14 DIAGNOSIS — S199XXA Unspecified injury of neck, initial encounter: Secondary | ICD-10-CM | POA: Diagnosis present

## 2018-02-14 DIAGNOSIS — Y9389 Activity, other specified: Secondary | ICD-10-CM | POA: Insufficient documentation

## 2018-02-14 DIAGNOSIS — I10 Essential (primary) hypertension: Secondary | ICD-10-CM | POA: Insufficient documentation

## 2018-02-14 MED ORDER — TRAMADOL HCL 50 MG PO TABS: 50.0000 mg | ORAL_TABLET | Freq: Four times a day (QID) | ORAL | 0 refills | Status: DC | PRN

## 2018-02-14 MED ORDER — CYCLOBENZAPRINE HCL 5 MG PO TABS: 5.0000 mg | ORAL_TABLET | Freq: Three times a day (TID) | ORAL | 0 refills | Status: DC | PRN
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
## 2018-02-14 NOTE — ED Notes (Signed)
Pt went to X-ray.   

## 2018-02-17 DIAGNOSIS — S46919A Strain of unspecified muscle, fascia and tendon at shoulder and upper arm level, unspecified arm, initial encounter: Secondary | ICD-10-CM | POA: Insufficient documentation

## 2018-02-17 DIAGNOSIS — M5412 Radiculopathy, cervical region: Secondary | ICD-10-CM | POA: Insufficient documentation

## 2018-02-17 DIAGNOSIS — S8390XA Sprain of unspecified site of unspecified knee, initial encounter: Secondary | ICD-10-CM | POA: Insufficient documentation

## 2018-03-01 ENCOUNTER — Encounter: Payer: Self-pay | Admitting: Family Medicine

## 2018-03-01 ENCOUNTER — Ambulatory Visit: Payer: Commercial Managed Care - PPO | Admitting: Family Medicine

## 2018-03-01 VITALS — BP 124/78 | Temp 98.4°F | Resp 16 | Wt 233.0 lb

## 2018-03-01 DIAGNOSIS — H6502 Acute serous otitis media, left ear: Secondary | ICD-10-CM

## 2018-03-01 NOTE — Progress Notes (Signed)
Patient: Stephen Ponce Male    DOB: 05/21/1946   72 y.o.   MRN: [id number] Visit Date: 03/01/2018  Today's Provider: Wilhemena Durie, MD   Chief Complaint  Patient presents with  . Cerumen Impaction   Subjective:    HPI Patient comes in today c/o ear wax impacted in his left ear. He reports that this is getting worse. He has used OTC Debrox with no improvement.     Allergies  Allergen Reactions  . Penicillins Itching and Swelling     Current Outpatient Medications:  .  Ascorbic Acid (VITAMIN C) 100 MG tablet, Take 100 mg by mouth daily., Disp: , Rfl:  .  cyanocobalamin 1000 MCG tablet, Place under the tongue., Disp: , Rfl:  .  cyclobenzaprine (FLEXERIL) 5 MG tablet, Take 1 tablet (5 mg total) by mouth 3 (three) times daily as needed for muscle spasms., Disp: 30 tablet, Rfl: 0 .  Glucos-Chond-Sterol-Fish Oil (GLUCOSAMINE CHONDROITIN PLUS) CAPS, Take by mouth., Disp: , Rfl:  .  Krill Oil 1000 MG CAPS, Take by mouth., Disp: , Rfl:  .  metoprolol succinate (TOPROL-XL) 25 MG 24 hr tablet, TAKE 1/2 TABLET BY MOUTH DAILY, Disp: 45 tablet, Rfl: 3 .  Multiple Vitamin tablet, Take by mouth., Disp: , Rfl:  .  naproxen (NAPROSYN) 500 MG tablet, TAKE 1 TABLET (500 MG TOTAL) BY MOUTH 2 (TWO) TIMES DAILY WITH A MEAL., Disp: 180 tablet, Rfl: 0 .  Pyridoxine HCl (VITAMIN B6) 200 MG TABS, Take by mouth., Disp: , Rfl:  .  traMADol (ULTRAM) 50 MG tablet, Take 1 tablet (50 mg total) by mouth every 6 (six) hours as needed., Disp: 20 tablet, Rfl: 0 .  Ubiquinol 100 MG CAPS, Take by mouth daily., Disp: , Rfl:   Review of Systems  Constitutional: Negative for activity change, appetite change, chills, diaphoresis, fatigue and fever.  HENT: Negative for congestion, ear discharge and ear pain.   Respiratory: Negative for cough and shortness of breath.   Cardiovascular: Negative.   Allergic/Immunologic: Negative.   Psychiatric/Behavioral: Negative.     Social History   Tobacco Use  .  Smoking status: Former Smoker    Packs/day: 3.00    Years: 35.00    Pack years: 105.00    Types: Cigarettes    Last attempt to quit: 09/07/1996    Years since quitting: 21.4  . Smokeless tobacco: Never Used  Substance Use Topics  . Alcohol use: Yes    Alcohol/week: 0.0 oz    Comment: ocassionally   Objective:   BP 124/78 (BP Location: Right Arm, Patient Position: Sitting, Cuff Size: Large)   Temp 98.4 F (36.9 C)   Resp 16   Wt 233 lb (105.7 kg)   SpO2 98%   BMI 34.41 kg/m  Vitals:   03/01/18 1433  BP: 124/78  Resp: 16  Temp: 98.4 F (36.9 C)  SpO2: 98%  Weight: 233 lb (105.7 kg)     Physical Exam  Constitutional: He is oriented to person, place, and time. He appears well-developed and well-nourished.  HENT:  Head: Normocephalic and atraumatic.  Right Ear: External ear normal.  Left Ear: External ear normal.  Nose: Nose normal.  Cerumen blocking left EAC.After removal TM full and erythematous.  Neck: No thyromegaly present.  Cardiovascular: Normal rate, regular rhythm and normal heart sounds.  Pulmonary/Chest: Effort normal and breath sounds normal.  Abdominal: Soft.  Neurological: He is alert and oriented to person, place, and time.  Skin: Skin is warm and dry.  Psychiatric: He has a normal mood and affect. His behavior is normal. Judgment and thought content normal.        Assessment & Plan:     Cerumenosis   Serous Otitis media  I have done the exam and reviewed the above chart and it is accurate to the best of my knowledge. Development worker, community has been used in this note in any air is in the dictation or transcription are unintentional.  Wilhemena Durie, MD  [place]

## 2018-03-02 ENCOUNTER — Ambulatory Visit (INDEPENDENT_AMBULATORY_CARE_PROVIDER_SITE_OTHER): Payer: Commercial Managed Care - PPO | Admitting: Family Medicine

## 2018-03-02 DIAGNOSIS — H6502 Acute serous otitis media, left ear: Secondary | ICD-10-CM

## 2018-03-05 NOTE — Progress Notes (Signed)
Left TM 80%improved.

## 2018-07-08 DIAGNOSIS — I82409 Acute embolism and thrombosis of unspecified deep veins of unspecified lower extremity: Secondary | ICD-10-CM

## 2018-07-08 HISTORY — DX: Acute embolism and thrombosis of unspecified deep veins of unspecified lower extremity: I82.409

## 2018-07-11 ENCOUNTER — Other Ambulatory Visit: Payer: Self-pay | Admitting: Specialist

## 2018-07-13 ENCOUNTER — Other Ambulatory Visit: Payer: Self-pay

## 2018-07-13 ENCOUNTER — Encounter
Admission: RE | Admit: 2018-07-13 | Discharge: 2018-07-13 | Disposition: A | Discharge status: Home / Self Care | Payer: PRIVATE HEALTH INSURANCE | Source: Ambulatory Visit | Attending: Specialist | Admitting: Specialist

## 2018-07-13 DIAGNOSIS — I10 Essential (primary) hypertension: Secondary | ICD-10-CM | POA: Insufficient documentation

## 2018-07-13 DIAGNOSIS — Z01818 Encounter for other preprocedural examination: Secondary | ICD-10-CM | POA: Insufficient documentation

## 2018-07-13 HISTORY — DX: Unspecified kidney failure: N19

## 2018-07-13 LAB — BASIC METABOLIC PANEL
ANION GAP: 9 (ref 5–15)
BUN: 17 mg/dL (ref 8–23)
CALCIUM: 9 mg/dL (ref 8.9–10.3)
CO2: 23 mmol/L (ref 22–32)
Chloride: 106 mmol/L (ref 98–111)
Creatinine, Ser: 0.56 mg/dL — ABNORMAL LOW (ref 0.61–1.24)
GFR calc Af Amer: >60 mL/min (ref >60)
GLUCOSE: 88 mg/dL (ref 70–99)
Potassium: 3.9 mmol/L (ref 3.5–5.1)
Sodium: 138 mmol/L (ref 135–145)

## 2018-07-13 LAB — CBC
HCT: 47.3 % (ref 39.0–52.0)
Hemoglobin: 16 g/dL (ref 13.0–17.0)
MCH: 31.1 pg (ref 26.0–34.0)
MCHC: 33.8 g/dL (ref 30.0–36.0)
MCV: 91.8 fL (ref 80.0–100.0)
NRBC: 0 % (ref 0.0–0.2)
PLATELETS: 215 10*3/uL (ref 150–400)
RBC: 5.15 MIL/uL (ref 4.22–5.81)
RDW: 12.4 % (ref 11.5–15.5)
WBC: 7.7 10*3/uL (ref 4.0–10.5)

## 2018-07-13 NOTE — Patient Instructions (Signed)
Your procedure is scheduled on: 07/18/18 Report to Day Surgery.[facility] SECOND FLOOR To find out your arrival time please call [phone] between 1PM - 3PM on 07/15/18.  Remember: Instructions that are not followed completely may result in serious medical risk,  up to and including death, or upon the discretion of your surgeon and anesthesiologist your  surgery may need to be rescheduled.     _X__ 1. Do not eat food after midnight the night before your procedure.                 No gum chewing or hard candies. You may drink clear liquids up to 2 hours                 before you are scheduled to arrive for your surgery- DO not drink clear                 liquids within 2 hours of the start of your surgery.                 Clear Liquids include:  water, apple juice without pulp, clear carbohydrate                 drink such as Clearfast of Gatorade, Black Coffee or Tea (Do not add                 anything to coffee or tea).  __X__2.  On the morning of surgery brush your teeth with toothpaste and water, you                may rinse your mouth with mouthwash if you wish.  Do not swallow any toothpaste of mouthwash.     _X__ 3.  No Alcohol for 24 hours before or after surgery.   _X__ 4.  Do Not Smoke or use e-cigarettes For 24 Hours Prior to Your Surgery.                 Do not use any chewable tobacco products for at least 6 hours prior to                 surgery.  ____  5.  Bring all medications with you on the day of surgery if instructed.   __X__  6.  Notify your doctor if there is any change in your medical condition      (cold, fever, infections).     Do not wear jewelry, make-up, hairpins, clips or nail polish. Do not wear lotions, powders, or perfumes. You may wear deodorant. Do not shave 48 hours prior to surgery. Men may shave face and neck. Do not bring valuables to the hospital.    [facility] is not responsible for any belongings or  valuables.  Contacts, dentures or bridgework may not be worn into surgery. Leave your suitcase in the car. After surgery it may be brought to your room. For patients admitted to the hospital, discharge time is determined by your treatment team.   Patients discharged the day of surgery will not be allowed to drive home.   Please read over the following fact sheets that you were given:   Surgical Site Infection Prevention    _X Take these medicines the morning of surgery with A SIP OF WATER:    1. METHOCARBAMOL  2. METOPROLOL  3.   4.  5.  6.  ____ Fleet Enema (as directed)   __X__ Use CHG Soap as directed  ____ Use inhalers on the day of surgery  ____ Stop metformin 2 days prior to surgery    ____ Take 1/2 of usual insulin dose the night before surgery. No insulin the morning          of surgery.   ____ Stop Coumadin/Plavix/aspirin on  _X__ Stop Anti-inflammatories on  STOP MELOXICAM NOW UNTIL AFTER SURGERY  __X__ Stop supplements until after surgery.  STOP NOW CO Q 10, GLUCOSAMIN , KRILL OIL UNTIL AFTER SURGERY ____ Bring C-Pap to the hospital.

## 2018-07-17 MED ORDER — CLINDAMYCIN PHOSPHATE 600 MG/50ML IV SOLN
600.0000 mg | INTRAVENOUS | Status: AC
  Administered 2018-07-18: 600 mg via INTRAVENOUS

## 2018-07-17 MED ORDER — CEFAZOLIN SODIUM-DEXTROSE 2-4 GM/100ML-% IV SOLN: 2.0000 g | INTRAVENOUS | Status: DC

## 2018-07-18 ENCOUNTER — Other Ambulatory Visit: Payer: Self-pay

## 2018-07-18 ENCOUNTER — Encounter: Payer: Self-pay | Admitting: *Deleted

## 2018-07-18 ENCOUNTER — Ambulatory Visit: Payer: PRIVATE HEALTH INSURANCE | Admitting: Anesthesiology

## 2018-07-18 ENCOUNTER — Encounter
Admission: RE | Disposition: A | Discharge status: Home / Self Care | Payer: Self-pay | Source: Ambulatory Visit | Attending: Specialist

## 2018-07-18 ENCOUNTER — Ambulatory Visit
Admission: RE | Admit: 2018-07-18 | Discharge: 2018-07-18 | Disposition: A | Discharge status: Home / Self Care | Payer: PRIVATE HEALTH INSURANCE | Source: Ambulatory Visit | Attending: Specialist | Admitting: Specialist

## 2018-07-18 DIAGNOSIS — Z87891 Personal history of nicotine dependence: Secondary | ICD-10-CM | POA: Diagnosis not present

## 2018-07-18 DIAGNOSIS — M75112 Incomplete rotator cuff tear or rupture of left shoulder, not specified as traumatic: Secondary | ICD-10-CM | POA: Insufficient documentation

## 2018-07-18 DIAGNOSIS — M7552 Bursitis of left shoulder: Secondary | ICD-10-CM | POA: Insufficient documentation

## 2018-07-18 DIAGNOSIS — I10 Essential (primary) hypertension: Secondary | ICD-10-CM | POA: Diagnosis not present

## 2018-07-18 DIAGNOSIS — M7542 Impingement syndrome of left shoulder: Secondary | ICD-10-CM | POA: Diagnosis not present

## 2018-07-18 DIAGNOSIS — M19012 Primary osteoarthritis, left shoulder: Secondary | ICD-10-CM | POA: Diagnosis present

## 2018-07-18 DIAGNOSIS — M7522 Bicipital tendinitis, left shoulder: Secondary | ICD-10-CM | POA: Insufficient documentation

## 2018-07-18 HISTORY — PX: SHOULDER ARTHROSCOPY WITH OPEN ROTATOR CUFF REPAIR: SHX6092

## 2018-07-18 SURGERY — ARTHROSCOPY, SHOULDER WITH REPAIR, ROTATOR CUFF, OPEN
Anesthesia: General | Laterality: Left

## 2018-07-18 MED ORDER — MELOXICAM 7.5 MG PO TABS
15.0000 mg | ORAL_TABLET | ORAL | Status: AC
  Administered 2018-07-18: 15 mg via ORAL

## 2018-07-18 MED ORDER — MIDAZOLAM HCL 2 MG/2ML IJ SOLN
INTRAMUSCULAR | Status: AC
  Filled 2018-07-18: qty 2

## 2018-07-18 MED ORDER — EPINEPHRINE PF 1 MG/ML IJ SOLN
INTRAMUSCULAR | Status: DC | PRN
  Administered 2018-07-18: 19 mL

## 2018-07-18 MED ORDER — LACTATED RINGERS IV SOLN
INTRAVENOUS | Status: DC | PRN
  Administered 2018-07-18: 08:00:00 via INTRAVENOUS

## 2018-07-18 MED ORDER — BUPIVACAINE-EPINEPHRINE (PF) 0.25% -1:200000 IJ SOLN
INTRAMUSCULAR | Status: DC | PRN
  Administered 2018-07-18: 20 mL via PERINEURAL
  Administered 2018-07-18: 30 mL via PERINEURAL
  Administered 2018-07-18: 20 mL via PERINEURAL

## 2018-07-18 MED ORDER — NEOMYCIN-POLYMYXIN B GU 40-200000 IR SOLN
Status: AC
  Filled 2018-07-18: qty 20

## 2018-07-18 MED ORDER — FENTANYL CITRATE (PF) 100 MCG/2ML IJ SOLN
INTRAMUSCULAR | Status: AC
  Filled 2018-07-18: qty 2

## 2018-07-18 MED ORDER — DEXAMETHASONE SODIUM PHOSPHATE 10 MG/ML IJ SOLN
INTRAMUSCULAR | Status: DC | PRN
  Administered 2018-07-18: 5 mg via INTRAVENOUS

## 2018-07-18 MED ORDER — LACTATED RINGERS IV SOLN
INTRAVENOUS | Status: DC
  Administered 2018-07-18: 07:00:00 via INTRAVENOUS

## 2018-07-18 MED ORDER — ONDANSETRON HCL 4 MG/2ML IJ SOLN
INTRAMUSCULAR | Status: DC | PRN
  Administered 2018-07-18: 4 mg via INTRAVENOUS

## 2018-07-18 MED ORDER — BUPIVACAINE HCL (PF) 0.5 % IJ SOLN
INTRAMUSCULAR | Status: DC | PRN
  Administered 2018-07-18: 10 mL via PERINEURAL

## 2018-07-18 MED ORDER — GABAPENTIN 300 MG PO CAPS
300.0000 mg | ORAL_CAPSULE | ORAL | Status: AC
  Administered 2018-07-18: 300 mg via ORAL

## 2018-07-18 MED ORDER — BUPIVACAINE HCL (PF) 0.5 % IJ SOLN
INTRAMUSCULAR | Status: AC
  Filled 2018-07-18: qty 10

## 2018-07-18 MED ORDER — ONDANSETRON HCL 4 MG/2ML IJ SOLN: 4.0000 mg | Freq: Once | INTRAMUSCULAR | Status: DC | PRN

## 2018-07-18 MED ORDER — BUPIVACAINE-EPINEPHRINE (PF) 0.25% -1:200000 IJ SOLN
INTRAMUSCULAR | Status: AC
  Filled 2018-07-18: qty 90

## 2018-07-18 MED ORDER — BUPIVACAINE LIPOSOME 1.3 % IJ SUSP
INTRAMUSCULAR | Status: AC
  Filled 2018-07-18: qty 20

## 2018-07-18 MED ORDER — MORPHINE SULFATE (PF) 4 MG/ML IV SOLN
INTRAVENOUS | Status: AC
  Filled 2018-07-18: qty 1

## 2018-07-18 MED ORDER — NEOMYCIN-POLYMYXIN B GU 40-200000 IR SOLN
Status: DC | PRN
  Administered 2018-07-18: 2 mL

## 2018-07-18 MED ORDER — EPINEPHRINE 30 MG/30ML IJ SOLN
INTRAMUSCULAR | Status: AC
  Filled 2018-07-18: qty 1

## 2018-07-18 MED ORDER — MELOXICAM 15 MG PO TABS: 15.0000 mg | ORAL_TABLET | Freq: Every day | ORAL | 3 refills | Status: DC

## 2018-07-18 MED ORDER — CHLORHEXIDINE GLUCONATE CLOTH 2 % EX PADS: 6.0000 | MEDICATED_PAD | Freq: Once | CUTANEOUS | Status: DC

## 2018-07-18 MED ORDER — LIDOCAINE HCL (PF) 1 % IJ SOLN
INTRAMUSCULAR | Status: AC
  Filled 2018-07-18: qty 5

## 2018-07-18 MED ORDER — GABAPENTIN 400 MG PO CAPS: 400.0000 mg | ORAL_CAPSULE | Freq: Three times a day (TID) | ORAL | 3 refills | Status: DC

## 2018-07-18 MED ORDER — FAMOTIDINE 20 MG PO TABS: 20.0000 mg | ORAL_TABLET | Freq: Once | ORAL | Status: DC

## 2018-07-18 MED ORDER — HYDROCODONE-ACETAMINOPHEN 5-325 MG PO TABS: 1.0000 | ORAL_TABLET | Freq: Four times a day (QID) | ORAL | 0 refills | Status: DC | PRN

## 2018-07-18 MED ORDER — PHENYLEPHRINE HCL 10 MG/ML IJ SOLN
INTRAMUSCULAR | Status: DC | PRN
  Administered 2018-07-18 (×2): 100 ug via INTRAVENOUS
  Administered 2018-07-18 (×2): 50 ug via INTRAVENOUS
  Administered 2018-07-18 (×2): 100 ug via INTRAVENOUS

## 2018-07-18 MED ORDER — LIDOCAINE HCL (CARDIAC) PF 100 MG/5ML IV SOSY
PREFILLED_SYRINGE | INTRAVENOUS | Status: DC | PRN
  Administered 2018-07-18: 100 mg via INTRAVENOUS

## 2018-07-18 MED ORDER — MORPHINE SULFATE (PF) 4 MG/ML IV SOLN
INTRAVENOUS | Status: DC | PRN
  Administered 2018-07-18: 4 mg via INTRAVENOUS

## 2018-07-18 MED ORDER — PROPOFOL 10 MG/ML IV BOLUS
INTRAVENOUS | Status: DC | PRN
  Administered 2018-07-18: 180 mg via INTRAVENOUS

## 2018-07-18 MED ORDER — ROCURONIUM BROMIDE 100 MG/10ML IV SOLN
INTRAVENOUS | Status: DC | PRN
  Administered 2018-07-18: 50 mg via INTRAVENOUS

## 2018-07-18 MED ORDER — FENTANYL CITRATE (PF) 100 MCG/2ML IJ SOLN: 25.0000 ug | Freq: Once | INTRAMUSCULAR | Status: DC

## 2018-07-18 MED ORDER — FENTANYL CITRATE (PF) 100 MCG/2ML IJ SOLN: 25.0000 ug | INTRAMUSCULAR | Status: DC | PRN

## 2018-07-18 MED ORDER — FENTANYL CITRATE (PF) 100 MCG/2ML IJ SOLN
INTRAMUSCULAR | Status: DC | PRN
  Administered 2018-07-18: 100 ug via INTRAVENOUS
  Administered 2018-07-18 (×3): 50 ug via INTRAVENOUS

## 2018-07-18 MED ORDER — BUPIVACAINE LIPOSOME 1.3 % IJ SUSP
INTRAMUSCULAR | Status: DC | PRN
  Administered 2018-07-18: 20 mL via PERINEURAL

## 2018-07-18 MED ORDER — MIDAZOLAM HCL 2 MG/2ML IJ SOLN: 1.5000 mg | Freq: Once | INTRAMUSCULAR | Status: DC

## 2018-07-18 MED ORDER — SUGAMMADEX SODIUM 200 MG/2ML IV SOLN
INTRAVENOUS | Status: DC | PRN
  Administered 2018-07-18: 150 mg via INTRAVENOUS

## 2018-07-18 SURGICAL SUPPLY — 64 items
4.2 ULTRAGATOR ×2 IMPLANT
5.5 ULTRAFARR ×2 IMPLANT
90 PROBE CONMED ×2 IMPLANT
ADAPTER IRRIG TUBE 2 SPIKE SOL (ADAPTER) ×2 IMPLANT
ANCHOR SUT 5.5 MULTIFIX (Orthopedic Implant) ×4 IMPLANT
BLADE AGGRESSIVE PLUS 4.0 (BLADE) IMPLANT
BLADE SURG 15 STRL LF DISP TIS (BLADE) ×1 IMPLANT
BLADE SURG 15 STRL SS (BLADE) ×1
BUR AGGRESSIVE+ 5.5 (BURR) IMPLANT
BUR BR 5.5 12 FLUTE (BURR) IMPLANT
BUR RADIUS 4.0X18.5 (BURR) IMPLANT
BUR RADIUS 5.5 (BURR) IMPLANT
CANNULA 5.75X7 CRYSTAL CLEAR (CANNULA) IMPLANT
CANNULA 8.5X75 THRED (CANNULA) IMPLANT
CANNULA PARTIAL THREAD 2X7 (CANNULA) IMPLANT
CHLORAPREP W/TINT 26ML (MISCELLANEOUS) ×2 IMPLANT
CONMED 50 PROBE ×2 IMPLANT
CONMED ULTRAGATOR 5.5 ×2 IMPLANT
CONNECTOR PERFECT PASSER (CONNECTOR) IMPLANT
COVER MAYO STAND STRL (DRAPES) ×2 IMPLANT
COVER WAND RF STERILE (DRAPES) ×2 IMPLANT
DRAPE IMP U-DRAPE 54X76 (DRAPES) ×4 IMPLANT
DRAPE SHEET LG 3/4 BI-LAMINATE (DRAPES) IMPLANT
DRAPE STERI 35X30 U-POUCH (DRAPES) ×2 IMPLANT
ELECT BLADE 6.5 EXT (BLADE) ×2 IMPLANT
GAUZE PETRO XEROFOAM 1X8 (MISCELLANEOUS) ×2 IMPLANT
GAUZE SPONGE 4X4 12PLY STRL (GAUZE/BANDAGES/DRESSINGS) ×4 IMPLANT
GLOVE SURG ORTHO 8.0 STRL STRW (GLOVE) ×2 IMPLANT
GOWN STRL REUS W/TWL LRG LVL4 (GOWN DISPOSABLE) ×2 IMPLANT
IV LACTATED RINGER IRRG 3000ML (IV SOLUTION) ×11
IV LR IRRIG 3000ML ARTHROMATIC (IV SOLUTION) ×11 IMPLANT
KIT SHOULDER TRACTION (DRAPES) ×2 IMPLANT
KIT TURNOVER KIT A (KITS) ×2 IMPLANT
MANIFOLD NEPTUNE II (INSTRUMENTS) ×2 IMPLANT
MAT ABSORB  FLUID 56X50 GRAY (MISCELLANEOUS) ×1
MAT ABSORB FLUID 56X50 GRAY (MISCELLANEOUS) ×1 IMPLANT
NDL SAFETY ECLIPSE 18X1.5 (NEEDLE) ×1 IMPLANT
NEEDLE HYPO 18GX1.5 SHARP (NEEDLE) ×1
NEEDLE SPNL 18GX3.5 QUINCKE PK (NEEDLE) ×2 IMPLANT
NS IRRIG 500ML POUR BTL (IV SOLUTION) ×2 IMPLANT
OVAL BUR ×2 IMPLANT
PACK ARTHROSCOPY SHOULDER (MISCELLANEOUS) ×2 IMPLANT
PASSER SUT CAPTURE FIRST (SUTURE) IMPLANT
PASSER SUT FIRSTPASS SELF (INSTRUMENTS) ×2 IMPLANT
SET TUBE SUCT SHAVER OUTFL 24K (TUBING) ×2 IMPLANT
SLING ULTRA II LG (MISCELLANEOUS) ×2 IMPLANT
SOL PREP PVP 2OZ (MISCELLANEOUS) ×2
SOLUTION PREP PVP 2OZ (MISCELLANEOUS) ×1 IMPLANT
SUT ETHIBOND CT1 BRD #0 30IN (SUTURE) ×2 IMPLANT
SUT ETHILON 3 0 FSLX (SUTURE) ×2 IMPLANT
SUT PDS PLUS 0 (SUTURE) ×1
SUT PDS PLUS AB 0 CT-2 (SUTURE) ×1 IMPLANT
SUT PERFECTPASSER WHITE CART (SUTURE) IMPLANT
SUT VIC AB 0 CT1 36 (SUTURE) ×2 IMPLANT
SUT VIC AB 2-0 CT2 27 (SUTURE) ×2 IMPLANT
SUT VICRYL 3-0 27IN (SUTURE) ×2 IMPLANT
SUTURE MAGNUM WIRE 2X48 BLK (SUTURE) ×8 IMPLANT
SYR 20CC LL (SYRINGE) ×2 IMPLANT
SYR 30ML LL (SYRINGE) ×2 IMPLANT
SYR 50ML LL SCALE MARK (SYRINGE) ×2 IMPLANT
TUBING ARTHRO INFLOW-ONLY STRL (TUBING) ×2 IMPLANT
TUBING CONNECTING 10 (TUBING) ×2 IMPLANT
WAND COBLATION FLOW 50 (SURGICAL WAND) IMPLANT
WAND HAND CNTRL MULTIVAC 90 (MISCELLANEOUS) IMPLANT

## 2018-07-18 NOTE — OR Nursing (Signed)
Dr. Andree Elk in to see pt postop 1359.  Pt advises "I can see better now".  Dr. Andree Elk instructed pt to go to fER if vision not back to normal within 24-36 hours.

## 2018-07-18 NOTE — Anesthesia Preprocedure Evaluation (Signed)
Anesthesia Evaluation  Patient identified by MRN, date of birth, ID band Patient awake    Reviewed: Allergy & Precautions, H&P , NPO status , Patient's Chart, lab work & pertinent test results, reviewed documented beta blocker date and time   Airway Mallampati: II  TM Distance: >3 FB Neck ROM: full    Dental  (+) Teeth Intact   Pulmonary neg pulmonary ROS, former smoker,    Pulmonary exam normal        Cardiovascular Exercise Tolerance: Good hypertension, On Medications negative cardio ROS Normal cardiovascular exam Rhythm:regular Rate:Normal     Neuro/Psych negative neurological ROS  negative psych ROS   GI/Hepatic negative GI ROS, Neg liver ROS,   Endo/Other  negative endocrine ROS  Renal/GU Renal disease  negative genitourinary   Musculoskeletal   Abdominal   Peds  Hematology negative hematology ROS (+)   Anesthesia Other Findings Past Medical History: No date: Hypertension No date: Renal failure     Comment:  5 years ago with sepsis/uti Past Surgical History: 08/09/2014: COLONOSCOPY     Comment:  tubular adenoma No date: OSTEOTOMY   Reproductive/Obstetrics negative OB ROS                             Anesthesia Physical Anesthesia Plan  ASA: II  Anesthesia Plan: General ETT   Post-op Pain Management:  Regional for Post-op pain   Induction:   PONV Risk Score and Plan:   Airway Management Planned:   Additional Equipment:   Intra-op Plan:   Post-operative Plan:   Informed Consent: I have reviewed the patients History and Physical, chart, labs and discussed the procedure including the risks, benefits and alternatives for the proposed anesthesia with the patient or authorized representative who has indicated his/her understanding and acceptance.   Dental Advisory Given  Plan Discussed with: CRNA  Anesthesia Plan Comments:         Anesthesia Quick  Evaluation

## 2018-07-18 NOTE — Anesthesia Post-op Follow-up Note (Signed)
Anesthesia QCDR form completed.        

## 2018-07-18 NOTE — Transfer of Care (Signed)
Immediate Anesthesia Transfer of Care Note  Patient: Stephen Ponce  Procedure(s) Performed: SHOULDER ARTHROSCOPY WITH OPEN ROTATOR CUFF REPAIR (Left )  Patient Location: PACU  Anesthesia Type:General  Level of Consciousness: awake  Airway & Oxygen Therapy: Patient Spontanous Breathing  Post-op Assessment: Report given to RN  Post vital signs: stable  Last Vitals:  Vitals Value Taken Time  BP 116/65 07/18/2018 11:24 AM  Temp    Pulse 72 07/18/2018 11:25 AM  Resp 17 07/18/2018 11:25 AM  SpO2 90 % 07/18/2018 11:25 AM  Vitals shown include unvalidated device data.  Last Pain:  Vitals:   07/18/18 0745  TempSrc:   PainSc: 0-No pain         Complications: No apparent anesthesia complications

## 2018-07-18 NOTE — Op Note (Signed)
07/18/2018  11:18 AM  PATIENT:  Stephen Ponce    PRE-OPERATIVE DIAGNOSIS:  FULL THICKNESS ROTATOR CUFF TEAR LEFT, IMPINGEMENT, AC JOINT ARTHRITIS, BICEPS TENDINITIS  POST-OPERATIVE DIAGNOSIS:  Same  PROCEDURE:  SHOULDER ARTHROSCOPY WITH MINI OPEN ROTATOR CUFF REPAIR, DISTAL CLAVICLE EXCISION, SUBACROMIAL DECOMPRESSION, AND BICEPS TENOTOMY  SURGEON:  Park Breed, MD  ASST: Carlynn Spry, PAC  ANESTHESIA:   General plus interscalene block  PREOPERATIVE INDICATIONS:  Stephen Ponce is a  72 y.o. male with a diagnosis of [place] who failed conservative measures and elected for surgical management.    The risks benefits and alternatives were discussed with the patient preoperatively including but not limited to the risks of infection, bleeding, nerve injury, cardiopulmonary complications, the need for revision surgery, among others, and the patient was willing to proceed.  OPERATIVE IMPLANTS: 1 ArthroCare multi fix anchor  OPERATIVE FINDINGS: The patient had severe subacromial bursitis and impingement. The anterior acromion was prominent. The ACjoint was arthritic. The glenohumeral joint was intact.  There was a very large tear of the rotator cuff supraspinatus going into the infraspinatus.  This was retracted and scarred.  The biceps tendon was frayed.  OPERATIVE PROCEDURE: The patient was brought to the operating room where satisfactory general endotracheal and interscalene anesthesia were accomplished.The patient was turned into the lateral decubitus position and the shoulder was prepped and draped in a sterile fashion. Arthroscopy was carried out from a posterior portal with accessory portals laterally and anteriorly. The  joint was examined first. The above findings were encountered. The motorized shaver was introduced anteriorly and the undersurface of the rotator cuff probed and lightly debrided.   The ArthroCare wand was introduced and the biceps tendon  was released completely. The labrum was trimmed up with the ArthroCare wand as well. The arthroscope was redirected into the subacromial space. There was severe bursitis which was resected with the motorized shaver and ArthroCare wand. The large bur was introduced from a posterior portal and the anterior acromion was debrided. The undersurface of the clavicle was debrided with the bur which was then reintroduced from an anterior portal and the remaining distal clavicle completely excised.  The rotator cuff tear was easily visualized.  There is retraction almost to the glenoid.  It was scarred and stiff.  3 Magnum wire sutures were introduced into the rotator cuff arthroscopicly.  Traction showed that the cuff could be brought back just to the edge of the tuberosity footprint.  The bone at the tuberosity footprint was debrided and burred lightly.  Hole was made laterally and the sutures passed through a multi fix anchor.  The anchor was advanced.  Initial traction weight was 12 pounds and was reduced to 5 pounds.  The anchor was introduced but would not hold.  I therefore elected to remove this anchor and perform a mini open procedure.  The lateral skin incision was lengthened superiorly and inferiorly and blunt dissection carried out down to the muscle fascia which was divided.  This was followed all the way up to the acromium and released there.  A self-retaining retractor was introduced and remaining adhesions were lysed.  Periosteal elevator was introduced above and below the cuff to try and loosen it up from adhesions.  I was able to get some more excursion this way and was able to bring the cuff back to the edge of the tuberosity footprint.  The tap for the anchor was again introduced more anteriorly and a sensed another  hole made.  All 6 loops of Magnum wire sutures were passed through the this anchor and it was was as the cuff was pulled over laterally the anchor was advanced into the new hole and was tapped  into position.  Threads were then turned and introduced into the bone completely.  This pulled the cuff over to the tuberosity.  Excess suture was excised.  There was a free flap of tissue posteriorly which was then sutured to the remaining cuff with 2-0 Ethibond suture to avoid impingement.  There was irrigated.  Bony coverage was felt to be adequate.  The deep fascia was closed with 0 Vicryl suture.  Subcu tissue was closed with 2-0 Vicryl.  The skin was closed all skin and stab wounds were closed with staples.  Half percent Marcaine quarter percent Marcaine with epinephrine was introduced.  A dry sterile dressing was applied and the traction removed.  Padded sling was applied. Sponge and needle counts were correct.   Patient was awakened and taken recovery in good condition.   Basil Dess.D.

## 2018-07-18 NOTE — Anesthesia Procedure Notes (Signed)
Date/Time: 07/18/2018 8:00 AM Performed by: Marcy Siren, CRNA Pre-anesthesia Checklist: Emergency Drugs available, Patient identified, Suction available, Patient being monitored and Timeout performed Patient Re-evaluated:Patient Re-evaluated prior to induction Oxygen Delivery Method: Circle system utilized Preoxygenation: Pre-oxygenation with 100% oxygen Induction Type: IV induction Ventilation: Mask ventilation without difficulty and Oral airway inserted - appropriate to patient size Laryngoscope Size: Mac and 3 Grade View: Grade I Tube type: Oral Laser Tube: Cuffed inflated with minimal occlusive pressure - saline Tube size: 7.0 mm Number of attempts: 1 Airway Equipment and Method: Stylet Placement Confirmation: ETT inserted through vocal cords under direct vision,  positive ETCO2,  CO2 detector and breath sounds checked- equal and bilateral Secured at: 22 cm Tube secured with: Tape Dental Injury: Teeth and Oropharynx as per pre-operative assessment

## 2018-07-18 NOTE — Anesthesia Procedure Notes (Signed)
Date/Time: 07/18/2018 8:00 AM Performed by: Marcy Siren, CRNA Pre-anesthesia Checklist: Patient identified, Emergency Drugs available, Suction available, Patient being monitored and Timeout performed Patient Re-evaluated:Patient Re-evaluated prior to induction Oxygen Delivery Method: Circle system utilized Preoxygenation: Pre-oxygenation with 100% oxygen Induction Type: IV induction Ventilation: Mask ventilation without difficulty and Oral airway inserted - appropriate to patient size Laryngoscope Size: Mac and 3 Grade View: Grade I Tube type: Oral Tube size: 7.0 mm Number of attempts: 1 Airway Equipment and Method: Stylet Placement Confirmation: ETT inserted through vocal cords under direct vision,  positive ETCO2,  CO2 detector and breath sounds checked- equal and bilateral Secured at: 22 cm Dental Injury: Teeth and Oropharynx as per pre-operative assessment

## 2018-07-18 NOTE — OR Nursing (Signed)
States "I think it's getting better, still a little blurry".  Saline gtts given for home use.  Discharge pending Dr. Baruch Merl visit to postop.

## 2018-07-18 NOTE — Discharge Instructions (Addendum)
AMBULATORY SURGERY  °DISCHARGE INSTRUCTIONS ° ° °1) The drugs that you were given will stay in your system until tomorrow so for the next 24 hours you should not: ° °A) Drive an automobile °B) Make any legal decisions °C) Drink any alcoholic beverage ° ° °2) You may resume regular meals tomorrow.  Today it is better to start with liquids and gradually work up to solid foods. ° °You may eat anything you prefer, but it is better to start with liquids, then soup and crackers, and gradually work up to solid foods. ° ° °3) Please notify your doctor immediately if you have any unusual bleeding, trouble breathing, redness and pain at the surgery site, drainage, fever, or pain not relieved by medication. ° ° ° °4) Additional Instructions: ° ° ° ° ° ° ° °Please contact your physician with any problems or Same Day Surgery at [phone], Monday through Friday 6 am to 4 pm, or [facility] number at [phone]. ° ° °Interscalene Nerve Block, Care After °This sheet gives you information about how to care for yourself after your procedure. Your health care provider may also give you more specific instructions. If you have problems or questions, contact your health care provider. °What can I expect after the procedure? °After the procedure, it is common to have: °· Soreness or tenderness in your neck. °· Numbness in your shoulder, upper arm, and some fingers. °· Weakness in your shoulder and arm muscles. ° °The feeling and strength in your shoulder, arm, and fingers should return to normal within hours after your procedure. °Follow these instructions at home: °For at least 24 hours after the procedure: °· Do not: °? Participate in activities in which you could fall or become injured. °? Drive. °? Use heavy machinery. °? Drink alcohol. °? Take sleeping pills or medicines that cause drowsiness. °? Make important decisions or sign legal documents. °? Take care of children on your own. °· Rest. °Eating and  drinking °· If you vomit, drink water, juice, or soup when you can drink without vomiting. °· Make sure you have little or no nausea before eating solid foods. °· Follow the diet that is recommended by your health care provider. °If you have a sling: °· Wear it as told by your health care provider. Remove it only as told by your health care provider. °· Loosen the sling if your fingers tingle, become numb, or turn cold and blue. °· Make sure that your entire arm, including your wrist, is supported. Do not allow your wrist to dangle over the end of the sling. °· Do not let your sling get wet if it is not waterproof. °· Keep the sling clean. °Bathing °· Do not take baths, swim, or use a hot tub until your health care provider approves. °· If you have a nerve block catheter in place, keep the incision site and tubing dry. °Injection site care °· Wash your hands with soap and water before you change your bandage (dressing). If soap and water are not available, use hand sanitizer. °· Change your dressing as told by your health care provider. °· Keep your dressing dry. °· Check your nerve block injection site every day for signs of infection. Check for: °? Redness, swelling, or pain. °? Fluid or blood. °? Warmth. °Activity °· Do not perform complex or risky activities while taking prescription pain medicine and until you have fully recovered. °· Return to your normal activities as told by your health care   provider and as you can tolerate them. Ask your health care provider what activities are safe for you. °· Rest and take it easy. This will help you heal and recover more quickly and fully. °· Be very cautious until you have regained strength and sensation. °General instructions °· Have a responsible adult stay with you until you are awake and alert. °· Do not drive or use heavy machinery while taking prescription pain medicine and until you have fully recovered. Ask your health care provider when it is safe to  drive. °· Take over-the-counter and prescription medicines only as told by your health care provider. °· If you smoke, do not smoke without supervision. °· Do not expose your arm or shoulder to very cold or very hot temperatures until you have full feeling back. °· If you have a nerve block catheter in place: °? Try to keep the catheter from getting kinked or pinched. °? Avoid pulling or tugging on the catheter. °· Keep all follow-up visits as told by your health care provider. This is important. °Contact a health care provider if: °· You have chills or fever. °· You have redness, swelling, or pain around your injection site. °· You have fluid or blood coming from the injection site. °· The skin around the injection site is warm to the touch. °· There is a bad smell coming from your dressing. °· You have hoarseness or a drooping or dry eye that lasts more than a few days. °· You have pain that is poorly controlled with the block or with pain medicine. °· You have numbness, tingling, or weakness in your shoulder or arm that lasts for more than one week. °Get help right away if: °· You have severe pain. °· You lose or do not regain strength and sensation in your arm even after the nerve block medicine has stopped. °· You have trouble breathing. °· You have a nerve block catheter still in place and you begin to shiver. °· You have a nerve block catheter still in place and you are getting more and more numb or weak. °This information is not intended to replace advice given to you by your health care provider. Make sure you discuss any questions you have with your health care provider. °Document Released: 08/16/2015 Document Revised: 04/24/2016 Document Reviewed: 04/24/2016 °Elsevier Interactive Patient Education © 2018 [employer] ° °

## 2018-07-18 NOTE — Anesthesia Procedure Notes (Signed)
Anesthesia Regional Block: Interscalene brachial plexus block   Pre-Anesthetic Checklist: ,, timeout performed, Correct Patient, Correct Site, Correct Laterality, Correct Procedure, Correct Position, site marked, Risks and benefits discussed,  Surgical consent,  Pre-op evaluation,  At surgeon's request and post-op pain management  Laterality: Left  Prep: chloraprep       Needles:  Injection technique: Single-shot  Needle Type: Echogenic Stimulator Needle     Needle Length: 10cm  Needle Gauge: 20     Additional Needles:   Procedures:, nerve stimulator,,, ultrasound used (permanent image in chart),,,,   Nerve Stimulator or Paresthesia:  Response: biceps flexion,   Additional Responses:   Narrative:  Injection made incrementally with aspirations every 5 mL.  Performed by: Personally  Anesthesiologist: Molli Barrows, MD  Additional Notes: Functioning IV was confirmed and monitors were applied. Time out performed. Vss.  Sterile prep and drape,hand hygiene and sterile gloves were used.  Negative aspiration and negative test dose prior to incremental administration of local anesthetic. The patient tolerated the procedure well.

## 2018-07-18 NOTE — OR Nursing (Signed)
C/o right eye "it's burning/stinging";  Saline drops applied by Florencia Reasons earlier.  Continues 1315, discussed with Dr. Andree Elk via tele, notified saline drops applied, states he will be to postop to see patient shortly.

## 2018-07-18 NOTE — H&P (Signed)
THE PATIENT WAS SEEN PRIOR TO SURGERY TODAY.  HISTORY, ALLERGIES, HOME MEDICATIONS AND OPERATIVE PROCEDURE WERE REVIEWED. RISKS AND BENEFITS OF SURGERY DISCUSSED WITH PATIENT AGAIN.  NO CHANGES FROM INITIAL HISTORY AND PHYSICAL NOTED.    

## 2018-07-19 ENCOUNTER — Encounter: Payer: Self-pay | Admitting: Specialist

## 2018-07-21 NOTE — Anesthesia Postprocedure Evaluation (Signed)
Anesthesia Post Note  Patient: Stephen Ponce  Procedure(s) Performed: SHOULDER ARTHROSCOPY WITH OPEN ROTATOR CUFF REPAIR (Left )  Patient location during evaluation: PACU Anesthesia Type: General Level of consciousness: awake and alert Pain management: pain level controlled Vital Signs Assessment: post-procedure vital signs reviewed and stable Respiratory status: spontaneous breathing, nonlabored ventilation, respiratory function stable and patient connected to nasal cannula oxygen Cardiovascular status: blood pressure returned to baseline and stable Postop Assessment: no apparent nausea or vomiting Anesthetic complications: no     Last Vitals:  Vitals:   07/18/18 1233 07/18/18 1312  BP: 129/71 127/71  Pulse: 62 62  Resp: 16 16  Temp: (!) 36.2 C (!) 36.2 C  SpO2: 93% 93%    Last Pain:  Vitals:   07/19/18 0811  TempSrc:   PainSc: 0-No pain                 Molli Barrows

## 2018-07-22 DIAGNOSIS — Z9889 Other specified postprocedural states: Secondary | ICD-10-CM | POA: Insufficient documentation
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
## 2018-08-25 ENCOUNTER — Encounter: Payer: Self-pay | Admitting: Family Medicine

## 2018-09-12 ENCOUNTER — Ambulatory Visit
Admission: RE | Admit: 2018-09-12 | Discharge: 2018-09-12 | Disposition: A | Discharge status: Home / Self Care | Payer: PRIVATE HEALTH INSURANCE | Source: Ambulatory Visit | Attending: Family Medicine | Admitting: Family Medicine

## 2018-09-12 ENCOUNTER — Ambulatory Visit (INDEPENDENT_AMBULATORY_CARE_PROVIDER_SITE_OTHER): Payer: Medicare HMO | Admitting: Family Medicine

## 2018-09-12 ENCOUNTER — Ambulatory Visit
Admission: RE | Admit: 2018-09-12 | Discharge: 2018-09-12 | Disposition: A | Discharge status: Home / Self Care | Payer: PRIVATE HEALTH INSURANCE | Attending: Family Medicine | Admitting: Family Medicine

## 2018-09-12 ENCOUNTER — Encounter: Payer: Self-pay | Admitting: Family Medicine

## 2018-09-12 VITALS — BP 124/70 | HR 78 | Temp 98.6°F | Resp 16 | Wt 240.0 lb

## 2018-09-12 DIAGNOSIS — Z6835 Body mass index (BMI) 35.0-35.9, adult: Secondary | ICD-10-CM

## 2018-09-12 DIAGNOSIS — E6609 Other obesity due to excess calories: Secondary | ICD-10-CM

## 2018-09-12 DIAGNOSIS — I5041 Acute combined systolic (congestive) and diastolic (congestive) heart failure: Secondary | ICD-10-CM

## 2018-09-12 DIAGNOSIS — Z0389 Encounter for observation for other suspected diseases and conditions ruled out: Secondary | ICD-10-CM | POA: Diagnosis not present

## 2018-09-12 DIAGNOSIS — R600 Localized edema: Secondary | ICD-10-CM | POA: Diagnosis not present

## 2018-09-12 MED ORDER — FUROSEMIDE 20 MG PO TABS: 20.0000 mg | ORAL_TABLET | Freq: Two times a day (BID) | ORAL | 0 refills | Status: DC

## 2018-09-12 NOTE — Progress Notes (Signed)
Patient: Stephen Ponce Male    DOB: 03-26-1946   73 y.o.   MRN: [id number] Visit Date: 09/12/2018  Today's Provider: Wilhemena Durie, MD   Chief Complaint  Patient presents with  . Edema   Subjective:     HPI  Patient comes in today c/o swelling in both of his legs. He reports that his symptoms have worsened over the last 4 days. He felt that his symptoms were coming from taking gabapentin, but he has not noticed any improvement since stopping the medication.    He had left shoulder surgery on 07/18/2018 and he states the swelling began to occur after that.  It is gradually gotten worse.  Left leg is greater than the right leg.  No pain, no orthopnea, no PND.  No chest pain or shortness of breath.  No hemoptysis. Allergies  Allergen Reactions  . Penicillins Itching, Swelling and Other (See Comments)    Has patient had a PCN reaction causing immediate rash, facial/tongue/throat swelling, SOB or lightheadedness with hypotension: Yes Has patient had a PCN reaction causing severe rash involving mucus membranes or skin necrosis: No Has patient had a PCN reaction that required hospitalization: No Has patient had a PCN reaction occurring within the last 10 years: No If all of the above answers are "NO", then may proceed with Cephalosporin use.      Current Outpatient Medications:  .  Ascorbic Acid (VITAMIN C) 1000 MG tablet, Take 1,000 mg by mouth daily. , Disp: , Rfl:  .  Cholecalciferol (VITAMIN D3) 1000 units CAPS, Take 1,000 Units by mouth daily., Disp: , Rfl:  .  gabapentin (NEURONTIN) 400 MG capsule, Take 1 capsule (400 mg total) by mouth 3 (three) times daily., Disp: 60 capsule, Rfl: 3 .  Glucos-Chond-Sterol-Fish Oil (GLUCOSAMINE CHONDROITIN PLUS) CAPS, Take 1 tablet by mouth daily. , Disp: , Rfl:  .  HYDROcodone-acetaminophen (NORCO) 5-325 MG tablet, Take 1-2 tablets by mouth every 6 (six) hours as needed., Disp: 50 tablet, Rfl: 0 .  Krill Oil 350 MG CAPS, Take 350 mg  by mouth daily. , Disp: , Rfl:  .  meloxicam (MOBIC) 15 MG tablet, Take 15 mg by mouth daily., Disp: , Rfl:  .  meloxicam (MOBIC) 15 MG tablet, Take 1 tablet (15 mg total) by mouth daily., Disp: 30 tablet, Rfl: 3 .  methocarbamol (ROBAXIN) 500 MG tablet, Take 500 mg by mouth 2 (two) times daily., Disp: , Rfl:  .  metoprolol succinate (TOPROL-XL) 25 MG 24 hr tablet, TAKE 1/2 TABLET BY MOUTH DAILY (Patient taking differently: Take 12.5 mg by mouth daily. ), Disp: 45 tablet, Rfl: 3 .  Multiple Vitamin tablet, Take 1 tablet by mouth daily. , Disp: , Rfl:  .  Pyridoxine HCl (VITAMIN B6) 200 MG TABS, Take 200 mg by mouth daily. , Disp: , Rfl:  .  Ubiquinol 100 MG CAPS, Take 100 mg by mouth daily. , Disp: , Rfl:  .  cyclobenzaprine (FLEXERIL) 5 MG tablet, Take 1 tablet (5 mg total) by mouth 3 (three) times daily as needed for muscle spasms. (Patient not taking: Reported on 07/12/2018), Disp: 30 tablet, Rfl: 0 .  naproxen (NAPROSYN) 500 MG tablet, TAKE 1 TABLET (500 MG TOTAL) BY MOUTH 2 (TWO) TIMES DAILY WITH A MEAL. (Patient not taking: Reported on 07/12/2018), Disp: 180 tablet, Rfl: 0 .  traMADol (ULTRAM) 50 MG tablet, Take 1 tablet (50 mg total) by mouth every 6 (six) hours as needed. (Patient  not taking: Reported on 07/12/2018), Disp: 20 tablet, Rfl: 0  Review of Systems  Constitutional: Negative.   HENT: Negative.   Eyes: Negative.   Respiratory: Negative.   Cardiovascular: Positive for leg swelling.  Musculoskeletal: Positive for arthralgias and myalgias.  Skin: Negative.   Neurological: Negative for dizziness, light-headedness and headaches.  Hematological: Negative.   Psychiatric/Behavioral: Negative.     Social History   Tobacco Use  . Smoking status: Former Smoker    Packs/day: 3.00    Years: 35.00    Pack years: 105.00    Types: Cigarettes    Last attempt to quit: 09/07/1996    Years since quitting: 22.0  . Smokeless tobacco: Never Used  Substance Use Topics  . Alcohol use: Yes     Alcohol/week: 0.0 standard drinks    Comment: ocassionally      Objective:   BP 124/70 (BP Location: Left Arm, Patient Position: Sitting, Cuff Size: Normal)   Resp 16   Wt 240 lb (108.9 kg)   BMI 35.44 kg/m  Vitals:   09/12/18 1503  BP: 124/70  Resp: 16  Weight: 240 lb (108.9 kg)     Physical Exam Constitutional:      Appearance: Normal appearance.  HENT:     Head: Normocephalic and atraumatic.     Right Ear: External ear normal.     Left Ear: External ear normal.     Nose: Nose normal.     Mouth/Throat:     Pharynx: Oropharynx is clear.  Eyes:     Conjunctiva/sclera: Conjunctivae normal.  Neck:     Musculoskeletal: No neck rigidity.  Cardiovascular:     Rate and Rhythm: Normal rate and regular rhythm.     Pulses: Normal pulses.     Heart sounds: Normal heart sounds.  Pulmonary:     Effort: Pulmonary effort is normal.     Breath sounds: Normal breath sounds.  Abdominal:     Palpations: Abdomen is soft.  Musculoskeletal:        General: Swelling present.     Comments: He has 3+ edema of the left lower extremity and 2+ edema of the right lower extremity.  Swelling extends slightly above the knees. No cords, negative Homans sign.  Lymphadenopathy:     Cervical: No cervical adenopathy.  Skin:    Coloration: Skin is not jaundiced.  Neurological:     General: No focal deficit present.     Mental Status: He is alert and oriented to person, place, and time. Mental status is at baseline.  Psychiatric:        Mood and Affect: Mood normal.        Behavior: Behavior normal.        Thought Content: Thought content normal.        Judgment: Judgment normal.   ECG is sinus rhythm without any strain or ischemic changes.      Assessment & Plan    1. Lower extremity edema Unless obtain Doppler ultrasounds of the lower extremities to rule out DVT due to recent orthopedic surgery.  He has had acute kidney kidney failure in the past which resolved and this could be heart  failure.  Will obtain appropriate labs.  ECG today is normal. More than 30 minutes is spent in collaboration of care counseling this patient regarding these issues. - EKG 12-Lead - CBC with Differential/Platelet - Comprehensive metabolic panel - TSH - D-Dimer, Quantitative - Pro b natriuretic peptide (BNP)[id number]) - US Venous  Img Lower Unilateral Left; Future - furosemide (LASIX) 20 MG tablet; Take 1 tablet (20 mg total) by mouth 2 (two) times daily.  Dispense: 60 tablet; Refill: 0  2. Acute combined systolic and diastolic heart failure ([place]) CHF is certainly a possibility.  May need referral back to cardiology.  We will treat as mild heart failure at this time with Lasix 20 mg twice a day and I will see him back in a week. - DG Chest 2 View; Future  3. 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  Recent shoulder surgery Patient still wearing sling for his shoulder.    I have done the exam and reviewed the above chart and it is accurate to the best of my knowledge. Development worker, community has been used in this note in any air is in the dictation or transcription are unintentional.  Wilhemena Durie, MD  [place]

## 2018-09-13 ENCOUNTER — Ambulatory Visit
Admission: RE | Admit: 2018-09-13 | Discharge: 2018-09-13 | Disposition: A | Discharge status: Home / Self Care | Payer: PRIVATE HEALTH INSURANCE | Source: Ambulatory Visit | Attending: Family Medicine | Admitting: Family Medicine

## 2018-09-13 DIAGNOSIS — I82432 Acute embolism and thrombosis of left popliteal vein: Secondary | ICD-10-CM | POA: Diagnosis not present

## 2018-09-13 DIAGNOSIS — R600 Localized edema: Secondary | ICD-10-CM | POA: Diagnosis not present

## 2018-09-13 LAB — COMPREHENSIVE METABOLIC PANEL
ALT: 23 IU/L (ref 0–44)
AST: 22 IU/L (ref 0–40)
Albumin/Globulin Ratio: 1.7 (ref 1.2–2.2)
Albumin: 4.5 g/dL (ref 3.5–4.8)
Alkaline Phosphatase: 59 IU/L (ref 39–117)
BUN/Creatinine Ratio: 20 (ref 10–24)
BUN: 15 mg/dL (ref 8–27)
Bilirubin Total: 0.9 mg/dL (ref 0.0–1.2)
CALCIUM: 9.7 mg/dL (ref 8.6–10.2)
CO2: 20 mmol/L (ref 20–29)
Chloride: 102 mmol/L (ref 96–106)
Creatinine, Ser: 0.75 mg/dL — ABNORMAL LOW (ref 0.76–1.27)
GFR, EST AFRICAN AMERICAN: 105 mL/min/{1.73_m2} (ref >59)
GFR, EST NON AFRICAN AMERICAN: 91 mL/min/{1.73_m2} (ref >59)
GLUCOSE: 90 mg/dL (ref 65–99)
Globulin, Total: 2.7 g/dL (ref 1.5–4.5)
POTASSIUM: 4.4 mmol/L (ref 3.5–5.2)
Sodium: 138 mmol/L (ref 134–144)
TOTAL PROTEIN: 7.2 g/dL (ref 6.0–8.5)

## 2018-09-13 LAB — TSH: TSH: 1.54 u[IU]/mL (ref 0.450–4.500)

## 2018-09-13 LAB — CBC WITH DIFFERENTIAL/PLATELET
Basophils Absolute: 0.1 10*3/uL (ref 0.0–0.2)
Basos: 1 %
EOS (ABSOLUTE): 0.4 10*3/uL (ref 0.0–0.4)
EOS: 4 %
Hematocrit: 48 % (ref 37.5–51.0)
Hemoglobin: 16.2 g/dL (ref 13.0–17.7)
Immature Grans (Abs): 0 10*3/uL (ref 0.0–0.1)
Immature Granulocytes: 0 %
LYMPHS ABS: 1.7 10*3/uL (ref 0.7–3.1)
Lymphs: 19 %
MCH: 29.5 pg (ref 26.6–33.0)
MCHC: 33.8 g/dL (ref 31.5–35.7)
MCV: 87 fL (ref 79–97)
MONOCYTES: 10 %
Monocytes Absolute: 0.9 10*3/uL (ref 0.1–0.9)
NEUTROS ABS: 5.9 10*3/uL (ref 1.4–7.0)
Neutrophils: 66 %
PLATELETS: 241 10*3/uL (ref 150–450)
RBC: 5.49 x10E6/uL (ref 4.14–5.80)
RDW: 12.2 % (ref 11.6–15.4)
WBC: 8.9 10*3/uL (ref 3.4–10.8)

## 2018-09-13 LAB — PRO B NATRIURETIC PEPTIDE: NT-Pro BNP: 54 pg/mL (ref 0–376)

## 2018-09-13 LAB — D-DIMER, QUANTITATIVE (NOT AT ARMC): D-DIMER: 3.21 mg{FEU}/L — AB (ref 0.00–0.49)

## 2018-09-13 MED ORDER — RIVAROXABAN (XARELTO) VTE STARTER PACK (15 & 20 MG): ORAL_TABLET | ORAL | 0 refills | Status: DC

## 2018-09-13 NOTE — Addendum Note (Signed)
Addended by: Wilburt Finlay L on: 09/13/2018 11:54 AM   Modules accepted: Orders

## 2018-09-13 NOTE — Addendum Note (Signed)
Addended by: Eulas Post on: 09/13/2018 02:13 PM   Modules accepted: Orders

## 2018-09-20 ENCOUNTER — Ambulatory Visit (INDEPENDENT_AMBULATORY_CARE_PROVIDER_SITE_OTHER): Payer: Medicare HMO | Admitting: Family Medicine

## 2018-09-20 ENCOUNTER — Encounter: Payer: Self-pay | Admitting: Family Medicine

## 2018-09-20 VITALS — BP 130/70 | Temp 97.8°F | Wt 239.6 lb

## 2018-09-20 DIAGNOSIS — Z96612 Presence of left artificial shoulder joint: Secondary | ICD-10-CM

## 2018-09-20 DIAGNOSIS — Z471 Aftercare following joint replacement surgery: Secondary | ICD-10-CM | POA: Diagnosis not present

## 2018-09-20 DIAGNOSIS — I82432 Acute embolism and thrombosis of left popliteal vein: Secondary | ICD-10-CM

## 2018-09-20 DIAGNOSIS — Z6835 Body mass index (BMI) 35.0-35.9, adult: Secondary | ICD-10-CM

## 2018-09-20 DIAGNOSIS — R600 Localized edema: Secondary | ICD-10-CM | POA: Diagnosis not present

## 2018-09-20 DIAGNOSIS — E6609 Other obesity due to excess calories: Secondary | ICD-10-CM
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
## 2018-09-21 ENCOUNTER — Telehealth: Payer: Self-pay | Admitting: Family Medicine

## 2018-09-21 NOTE — Telephone Encounter (Signed)
Patient is requesting a copy of his office visit from 09/20/18.  He states that Dr. Rosanna Randy was to put in the office note that the blood clot is from his workers comp injury. He needs this note so he can have the Xarelto covered by Workers Comp.

## 2018-09-21 NOTE — Telephone Encounter (Signed)
Pt's note from Dr. Rosanna Randy regarding his blood clot from surgery in November will need to be faxed to reimburse pt for medication Xarelto -  $300 after insurance.  Fax to:  [facility] Fax:  [phone]   Thanks, [facility]

## 2018-09-24 ENCOUNTER — Other Ambulatory Visit: Payer: Self-pay | Admitting: Family Medicine

## 2018-10-04 ENCOUNTER — Other Ambulatory Visit: Payer: Self-pay | Admitting: Family Medicine

## 2018-10-04 DIAGNOSIS — R600 Localized edema: Secondary | ICD-10-CM

## 2018-10-04 NOTE — Telephone Encounter (Signed)
Pharmacy requesting refills. Thanks!  

## 2018-10-05 ENCOUNTER — Encounter: Payer: Self-pay | Admitting: Family Medicine

## 2018-10-11 ENCOUNTER — Telehealth: Payer: Self-pay | Admitting: Family Medicine

## 2018-10-11 NOTE — Telephone Encounter (Signed)
Pt states Dr. Rosanna Randy put him on Xarelto starter pack, pt states he is taking day 21 today and per pack it states " contact your provider before taking day 22".   Pt states he is not sure what that means or what he needs to do.    He also wants to know what he should be regards of following up on his blood clot.

## 2018-10-11 NOTE — Telephone Encounter (Signed)
Patient advised.

## 2018-10-11 NOTE — Telephone Encounter (Signed)
Just stay on Xarelto--go to day 22

## 2018-10-26 ENCOUNTER — Other Ambulatory Visit: Payer: Self-pay

## 2018-10-26 ENCOUNTER — Ambulatory Visit (INDEPENDENT_AMBULATORY_CARE_PROVIDER_SITE_OTHER): Payer: Medicare HMO | Admitting: Family Medicine

## 2018-10-26 ENCOUNTER — Encounter: Payer: Self-pay | Admitting: Family Medicine

## 2018-10-26 VITALS — BP 136/68 | HR 67 | Temp 98.3°F | Ht 69.0 in | Wt 239.4 lb

## 2018-10-26 DIAGNOSIS — I82432 Acute embolism and thrombosis of left popliteal vein: Secondary | ICD-10-CM | POA: Diagnosis not present

## 2018-10-26 DIAGNOSIS — E6609 Other obesity due to excess calories: Secondary | ICD-10-CM

## 2018-10-26 DIAGNOSIS — Z6835 Body mass index (BMI) 35.0-35.9, adult: Secondary | ICD-10-CM

## 2018-10-26 MED ORDER — RIVAROXABAN 20 MG PO TABS: 20.0000 mg | ORAL_TABLET | Freq: Every day | ORAL | 5 refills | Status: DC

## 2018-10-26 NOTE — Progress Notes (Signed)
Patient: Stephen Ponce Male    DOB: 1946-04-24   73 y.o.   MRN: [id number] Visit Date: 10/26/2018  Today's Provider: Wilhemena Durie, MD   Chief Complaint  Patient presents with  . Follow-up    1 month for DVT left lower leg   Subjective:     HPI   Follow up for DVT left lower leg  The patient was last seen for this 1 months ago. Changes made at last visit include starting Xarelto for 3 to 6 month treatment After reviewing the data this was a provoked DVT.  Patient is feeling much better.  This was from 09/12/2014.  Was when the treatment began.  Treatment will be for 3 months Pete ultrasound.  If cleared at 3 months then we can stop.  He reports good compliance with treatment. He feels that condition is Improved. He is not having side effects. Pt stays sleepy  ------------------------------------------------------------------------------------   Allergies  Allergen Reactions  . Penicillins Itching, Swelling and Other (See Comments)    Has patient had a PCN reaction causing immediate rash, facial/tongue/throat swelling, SOB or lightheadedness with hypotension: Yes Has patient had a PCN reaction causing severe rash involving mucus membranes or skin necrosis: No Has patient had a PCN reaction that required hospitalization: No Has patient had a PCN reaction occurring within the last 10 years: No If all of the above answers are "NO", then may proceed with Cephalosporin use.      Current Outpatient Medications:  .  Ascorbic Acid (VITAMIN C) 1000 MG tablet, Take 1,000 mg by mouth daily. , Disp: , Rfl:  .  Cholecalciferol (VITAMIN D3) 1000 units CAPS, Take 1,000 Units by mouth daily., Disp: , Rfl:  .  Glucos-Chond-Sterol-Fish Oil (GLUCOSAMINE CHONDROITIN PLUS) CAPS, Take 1 tablet by mouth daily. , Disp: , Rfl:  .  Krill Oil 350 MG CAPS, Take 350 mg by mouth daily. , Disp: , Rfl:  .  metoprolol succinate (TOPROL-XL) 25 MG 24 hr tablet, Take 0.5 tablets (12.5 mg  total) by mouth daily., Disp: 90 tablet, Rfl: 3 .  Multiple Vitamin tablet, Take 1 tablet by mouth daily. , Disp: , Rfl:  .  Pyridoxine HCl (VITAMIN B6) 200 MG TABS, Take 200 mg by mouth daily. , Disp: , Rfl:  .  Rivaroxaban 15 & 20 MG TBPK, Take as directed on package: Start with one 15mg  tablet by mouth twice a day with food. On Day 22, switch to one 20mg  tablet once a day with food., Disp: 51 each, Rfl: 0 .  Ubiquinol 100 MG CAPS, Take 100 mg by mouth daily. , Disp: , Rfl:  .  cyclobenzaprine (FLEXERIL) 5 MG tablet, Take 1 tablet (5 mg total) by mouth 3 (three) times daily as needed for muscle spasms. (Patient not taking: Reported on 07/12/2018), Disp: 30 tablet, Rfl: 0 .  furosemide (LASIX) 20 MG tablet, TAKE 1 TABLET BY MOUTH TWICE A DAY (Patient not taking: Reported on 10/26/2018), Disp: 60 tablet, Rfl: 4 .  gabapentin (NEURONTIN) 400 MG capsule, Take 1 capsule (400 mg total) by mouth 3 (three) times daily. (Patient not taking: Reported on 09/20/2018), Disp: 60 capsule, Rfl: 3 .  HYDROcodone-acetaminophen (NORCO) 5-325 MG tablet, Take 1-2 tablets by mouth every 6 (six) hours as needed. (Patient not taking: Reported on 09/20/2018), Disp: 50 tablet, Rfl: 0 .  meloxicam (MOBIC) 15 MG tablet, Take 15 mg by mouth daily., Disp: , Rfl:  .  meloxicam (MOBIC) 15  MG tablet, Take 1 tablet (15 mg total) by mouth daily. (Patient not taking: Reported on 09/20/2018), Disp: 30 tablet, Rfl: 3 .  methocarbamol (ROBAXIN) 500 MG tablet, Take 500 mg by mouth 2 (two) times daily., Disp: , Rfl:  .  naproxen (NAPROSYN) 500 MG tablet, TAKE 1 TABLET (500 MG TOTAL) BY MOUTH 2 (TWO) TIMES DAILY WITH A MEAL. (Patient not taking: Reported on 07/12/2018), Disp: 180 tablet, Rfl: 0 .  traMADol (ULTRAM) 50 MG tablet, Take 1 tablet (50 mg total) by mouth every 6 (six) hours as needed. (Patient not taking: Reported on 07/12/2018), Disp: 20 tablet, Rfl: 0  Review of Systems  Constitutional: Negative.   HENT: Negative.   Eyes: Negative.    Respiratory: Negative.   Cardiovascular: Negative.   Gastrointestinal: Negative.   Endocrine: Negative.   Genitourinary: Negative.   Musculoskeletal: Negative.   Skin: Negative.   Allergic/Immunologic: Negative.   Neurological: Negative.   Hematological: Negative.   Psychiatric/Behavioral: Negative.     Social History   Tobacco Use  . Smoking status: Former Smoker    Packs/day: 3.00    Years: 35.00    Pack years: 105.00    Types: Cigarettes    Last attempt to quit: 09/07/1996    Years since quitting: 22.1  . Smokeless tobacco: Never Used  Substance Use Topics  . Alcohol use: Yes    Alcohol/week: 0.0 standard drinks    Comment: ocassionally      Objective:   BP 136/68 (BP Location: Right Arm, Patient Position: Sitting, Cuff Size: Normal)   Pulse 67   Temp 98.3 F (36.8 C) (Oral)   Ht 5\' 9"  (1.753 m)   Wt 239 lb 6.4 oz (108.6 kg)   SpO2 96%   BMI 35.35 kg/m  Vitals:   10/26/18 1444  BP: 136/68  Pulse: 67  Temp: 98.3 F (36.8 C)  TempSrc: Oral  SpO2: 96%  Weight: 239 lb 6.4 oz (108.6 kg)  Height: 5\' 9"  (1.753 m)     Physical Exam Constitutional:      Appearance: Normal appearance.  HENT:     Head: Normocephalic and atraumatic.     Right Ear: External ear normal.     Left Ear: External ear normal.     Nose: Nose normal.     Mouth/Throat:     Pharynx: Oropharynx is clear.  Eyes:     Conjunctiva/sclera: Conjunctivae normal.  Neck:     Musculoskeletal: No neck rigidity.  Cardiovascular:     Rate and Rhythm: Normal rate and regular rhythm.     Pulses: Normal pulses.     Heart sounds: Normal heart sounds.  Pulmonary:     Effort: Pulmonary effort is normal.     Breath sounds: Normal breath sounds.  Abdominal:     Palpations: Abdomen is soft.  Musculoskeletal:        General: Swelling present.     Comments: He has 1+ edema of the left lower extremity and trace edema of the right lower extremity. . Swelling improved from last week.    Lymphadenopathy:     Cervical: No cervical adenopathy.  Skin:    Coloration: Skin is not jaundiced.  Neurological:     General: No focal deficit present.     Mental Status: He is alert and oriented to person, place, and time. Mental status is at baseline.  Psychiatric:        Mood and Affect: Mood normal.        Behavior: Behavior  normal.        Thought Content: Thought content normal.        Judgment: Judgment normal.         Assessment & Plan    1. Acute deep vein thrombosis (DVT) of popliteal vein of left lower extremity ([facility]) Plan to stop Xarelto after 3 months of treatment if follow-up ultrasound has cleared.  Return to clinic in April. - rivaroxaban (XARELTO) 20 MG TABS tablet; Take 1 tablet (20 mg total) by mouth daily with supper.  Dispense: 30 tablet; Refill: 5 - CBC with Differential/Platelet - Renal function panel  2. Class 2 obesity du e to excess calories without serious comorbidity with body mass index (BMI) of 35.0 to 35.9 in adult     I have done the exam and reviewed the above chart and it is accurate to the best of my knowledge. Development worker, community has been used in this note in any air is in the dictation or transcription are unintentional.  Wilhemena Durie, MD  [place]

## 2018-10-27 ENCOUNTER — Telehealth: Payer: Self-pay

## 2018-10-27 LAB — CBC WITH DIFFERENTIAL/PLATELET
BASOS ABS: 0.1 10*3/uL (ref 0.0–0.2)
Basos: 1 %
EOS (ABSOLUTE): 0.2 10*3/uL (ref 0.0–0.4)
Eos: 3 %
Hematocrit: 43.8 % (ref 37.5–51.0)
Hemoglobin: 14.5 g/dL (ref 13.0–17.7)
IMMATURE GRANS (ABS): 0 10*3/uL (ref 0.0–0.1)
IMMATURE GRANULOCYTES: 0 %
LYMPHS: 22 %
Lymphocytes Absolute: 1.5 10*3/uL (ref 0.7–3.1)
MCH: 29.9 pg (ref 26.6–33.0)
MCHC: 33.1 g/dL (ref 31.5–35.7)
MCV: 90 fL (ref 79–97)
Monocytes Absolute: 0.7 10*3/uL (ref 0.1–0.9)
Monocytes: 11 %
NEUTROS PCT: 63 %
Neutrophils Absolute: 4.3 10*3/uL (ref 1.4–7.0)
PLATELETS: 225 10*3/uL (ref 150–450)
RBC: 4.85 x10E6/uL (ref 4.14–5.80)
RDW: 12.4 % (ref 11.6–15.4)
WBC: 6.8 10*3/uL (ref 3.4–10.8)

## 2018-10-27 LAB — RENAL FUNCTION PANEL
Albumin: 4.3 g/dL (ref 3.7–4.7)
BUN / CREAT RATIO: 25 — AB (ref 10–24)
BUN: 18 mg/dL (ref 8–27)
CALCIUM: 9.8 mg/dL (ref 8.6–10.2)
CHLORIDE: 106 mmol/L (ref 96–106)
CO2: 21 mmol/L (ref 20–29)
Creatinine, Ser: 0.71 mg/dL — ABNORMAL LOW (ref 0.76–1.27)
GFR calc Af Amer: 108 mL/min/{1.73_m2} (ref >59)
GFR calc non Af Amer: 93 mL/min/{1.73_m2} (ref >59)
GLUCOSE: 102 mg/dL — AB (ref 65–99)
PHOSPHORUS: 3.6 mg/dL (ref 2.8–4.1)
POTASSIUM: 4.3 mmol/L (ref 3.5–5.2)
SODIUM: 143 mmol/L (ref 134–144)

## 2018-10-27 NOTE — Telephone Encounter (Signed)
Tried calling patient, no answer. VMbox full. Will try again later.

## 2018-10-27 NOTE — Telephone Encounter (Signed)
-----   Message from Jerrol Banana., MD sent at 10/27/2018  8:26 AM EST ----- Stable.  Mildly dehydrated.  Push water just a little bit.

## 2018-10-31 NOTE — Telephone Encounter (Signed)
Patient advised as below.  

## 2018-10-31 NOTE — Telephone Encounter (Signed)
Tried to call pt again and still no answer and vm is full.  dbs

## 2019-01-03 ENCOUNTER — Encounter: Payer: Self-pay | Admitting: Family Medicine

## 2019-01-03 ENCOUNTER — Other Ambulatory Visit: Payer: Self-pay

## 2019-01-03 ENCOUNTER — Ambulatory Visit (INDEPENDENT_AMBULATORY_CARE_PROVIDER_SITE_OTHER): Payer: Medicare HMO | Admitting: Family Medicine

## 2019-01-03 VITALS — BP 132/74 | HR 74 | Temp 99.0°F | Resp 16 | Ht 69.0 in | Wt 235.0 lb

## 2019-01-03 DIAGNOSIS — I82432 Acute embolism and thrombosis of left popliteal vein: Secondary | ICD-10-CM

## 2019-01-03 DIAGNOSIS — Z6835 Body mass index (BMI) 35.0-35.9, adult: Secondary | ICD-10-CM

## 2019-01-03 DIAGNOSIS — E6609 Other obesity due to excess calories: Secondary | ICD-10-CM

## 2019-01-03 NOTE — Progress Notes (Signed)
Patient: Stephen Ponce Male    DOB: 03/16/1946   73 y.o.   MRN: [id number] Visit Date: 01/03/2019  Today's Provider: Wilhemena Durie, MD   Chief Complaint  Patient presents with  . Follow-up  . deep vein thrombosis   Subjective:     HPI Patient comes in today for a follow up. He was last seen in the office 2 months ago. He reports that he is still doing well on Xarelto. He does have swelling in legs after standing for long periods of time, but reports that its not bothersome.  He still has a little swelling below the calf but this is also the side of his bad knee in addition to the side of the DVT on the left.  Overall he feels very well and has no complaints.  His shoulder is improving. BP Readings from Last 3 Encounters:  01/03/19 132/74  10/26/18 136/68  09/20/18 130/70   Wt Readings from Last 3 Encounters:  01/03/19 235 lb (106.6 kg)  10/26/18 239 lb 6.4 oz (108.6 kg)  09/20/18 239 lb 9.6 oz (108.7 kg)    Allergies  Allergen Reactions  . Penicillins Itching, Swelling and Other (See Comments)    Has patient had a PCN reaction causing immediate rash, facial/tongue/throat swelling, SOB or lightheadedness with hypotension: Yes Has patient had a PCN reaction causing severe rash involving mucus membranes or skin necrosis: No Has patient had a PCN reaction that required hospitalization: No Has patient had a PCN reaction occurring within the last 10 years: No If all of the above answers are "NO", then may proceed with Cephalosporin use.      Current Outpatient Medications:  Marland Kitchen  Glucos-Chond-Sterol-Fish Oil (GLUCOSAMINE CHONDROITIN PLUS) CAPS, Take 1 tablet by mouth daily. , Disp: , Rfl:  .  Krill Oil 350 MG CAPS, Take 350 mg by mouth daily. , Disp: , Rfl:  .  metoprolol succinate (TOPROL-XL) 25 MG 24 hr tablet, Take 0.5 tablets (12.5 mg total) by mouth daily., Disp: 90 tablet, Rfl: 3 .  Multiple Vitamin tablet, Take 1 tablet by mouth daily. , Disp: , Rfl:  .   Pyridoxine HCl (VITAMIN B6) 200 MG TABS, Take 200 mg by mouth daily. , Disp: , Rfl:  .  rivaroxaban (XARELTO) 20 MG TABS tablet, Take 1 tablet (20 mg total) by mouth daily with supper., Disp: 30 tablet, Rfl: 5 .  Ubiquinol 100 MG CAPS, Take 100 mg by mouth daily. , Disp: , Rfl:  .  Ascorbic Acid (VITAMIN C) 1000 MG tablet, Take 1,000 mg by mouth daily. , Disp: , Rfl:  .  Cholecalciferol (VITAMIN D3) 1000 units CAPS, Take 1,000 Units by mouth daily., Disp: , Rfl:  .  cyclobenzaprine (FLEXERIL) 5 MG tablet, Take 1 tablet (5 mg total) by mouth 3 (three) times daily as needed for muscle spasms. (Patient not taking: Reported on 07/12/2018), Disp: 30 tablet, Rfl: 0 .  furosemide (LASIX) 20 MG tablet, TAKE 1 TABLET BY MOUTH TWICE A DAY (Patient not taking: Reported on 10/26/2018), Disp: 60 tablet, Rfl: 4 .  gabapentin (NEURONTIN) 400 MG capsule, Take 1 capsule (400 mg total) by mouth 3 (three) times daily. (Patient not taking: Reported on 09/20/2018), Disp: 60 capsule, Rfl: 3 .  HYDROcodone-acetaminophen (NORCO) 5-325 MG tablet, Take 1-2 tablets by mouth every 6 (six) hours as needed. (Patient not taking: Reported on 09/20/2018), Disp: 50 tablet, Rfl: 0 .  meloxicam (MOBIC) 15 MG tablet, Take 15 mg  by mouth daily., Disp: , Rfl:  .  meloxicam (MOBIC) 15 MG tablet, Take 1 tablet (15 mg total) by mouth daily. (Patient not taking: Reported on 09/20/2018), Disp: 30 tablet, Rfl: 3 .  methocarbamol (ROBAXIN) 500 MG tablet, Take 500 mg by mouth 2 (two) times daily., Disp: , Rfl:  .  naproxen (NAPROSYN) 500 MG tablet, TAKE 1 TABLET (500 MG TOTAL) BY MOUTH 2 (TWO) TIMES DAILY WITH A MEAL. (Patient not taking: Reported on 07/12/2018), Disp: 180 tablet, Rfl: 0 .  traMADol (ULTRAM) 50 MG tablet, Take 1 tablet (50 mg total) by mouth every 6 (six) hours as needed. (Patient not taking: Reported on 07/12/2018), Disp: 20 tablet, Rfl: 0  Review of Systems  Constitutional: Negative.   Eyes: Negative.   Respiratory: Negative.    Cardiovascular: Negative.   Endocrine: Negative.   Musculoskeletal: Positive for arthralgias.  Allergic/Immunologic: Negative.   Psychiatric/Behavioral: Negative.     Social History   Tobacco Use  . Smoking status: Former Smoker    Packs/day: 3.00    Years: 35.00    Pack years: 105.00    Types: Cigarettes    Last attempt to quit: 09/07/1996    Years since quitting: 22.3  . Smokeless tobacco: Never Used  Substance Use Topics  . Alcohol use: Yes    Alcohol/week: 0.0 standard drinks    Comment: ocassionally      Objective:   BP 132/74 (BP Location: Right Arm, Patient Position: Sitting, Cuff Size: Large)   Pulse 74   Temp 99 F (37.2 C)   Resp 16   Ht 5\' 9"  (1.753 m)   Wt 235 lb (106.6 kg)   SpO2 95%   BMI 34.70 kg/m  Vitals:   01/03/19 1432  BP: 132/74  Pulse: 74  Resp: 16  Temp: 99 F (37.2 C)  SpO2: 95%  Weight: 235 lb (106.6 kg)  Height: 5\' 9"  (1.753 m)     Physical Exam Constitutional:      Appearance: Normal appearance.  HENT:     Head: Normocephalic and atraumatic.     Right Ear: External ear normal.     Left Ear: External ear normal.  Eyes:     General: No scleral icterus. Cardiovascular:     Rate and Rhythm: Normal rate and regular rhythm.     Pulses: Normal pulses.     Heart sounds: Normal heart sounds.  Pulmonary:     Breath sounds: Normal breath sounds.  Abdominal:     Palpations: Abdomen is soft.  Musculoskeletal:     Comments: 1+ left lower extremity edema below the calf, trace right lower extremity below the calf.  Skin:    General: Skin is warm and dry.  Neurological:     Mental Status: He is alert and oriented to person, place, and time.  Psychiatric:        Mood and Affect: Mood normal.        Behavior: Behavior normal.        Thought Content: Thought content normal.        Judgment: Judgment normal.         Assessment & Plan    1. Acute deep vein thrombosis (DVT) of popliteal vein of left lower extremity (HCC) Repeat  ultrasound of lower extremity to see if the vein is recanalized.  Finish Xarelto, his last dose is  3 to 4 days - US Venous Img Lower Unilateral Left; Future  2. Class 2 obesity due to excess calories without  serious comorbidity with body mass index (BMI) of 35.0 to 35.9 in adult Follow-up for physical this summer or fall.     Richard Cranford Mon, MD  [facility]

## 2019-01-03 NOTE — Patient Instructions (Signed)
Finish Xarelto then stop.

## 2019-01-16 ENCOUNTER — Other Ambulatory Visit: Payer: Self-pay

## 2019-01-16 ENCOUNTER — Ambulatory Visit
Admission: RE | Admit: 2019-01-16 | Discharge: 2019-01-16 | Disposition: A | Discharge status: Home / Self Care | Payer: PRIVATE HEALTH INSURANCE | Source: Ambulatory Visit | Attending: Family Medicine | Admitting: Family Medicine

## 2019-01-16 DIAGNOSIS — I82432 Acute embolism and thrombosis of left popliteal vein: Secondary | ICD-10-CM | POA: Diagnosis not present

## 2019-01-17 ENCOUNTER — Telehealth: Payer: Self-pay

## 2019-01-17 NOTE — Telephone Encounter (Signed)
LMTCB  01/17/2019   Thanks,   -Mickel Baas

## 2019-01-17 NOTE — Telephone Encounter (Signed)
Pt advised.   Thanks,   -Laura  

## 2019-01-17 NOTE — Telephone Encounter (Signed)
-----   Message from Jerrol Banana., MD sent at 01/16/2019  3:24 PM EDT ----- Better!

## 2019-04-07 ENCOUNTER — Other Ambulatory Visit: Payer: Self-pay

## 2019-04-07 ENCOUNTER — Encounter
Admission: RE | Admit: 2019-04-07 | Discharge: 2019-04-07 | Disposition: A | Discharge status: Home / Self Care | Payer: Medicare HMO | Source: Ambulatory Visit | Attending: Specialist | Admitting: Specialist

## 2019-04-07 ENCOUNTER — Other Ambulatory Visit: Payer: Self-pay | Admitting: Specialist

## 2019-04-07 ENCOUNTER — Other Ambulatory Visit
Admission: RE | Admit: 2019-04-07 | Discharge: 2019-04-07 | Disposition: A | Discharge status: Home / Self Care | Payer: PRIVATE HEALTH INSURANCE | Source: Ambulatory Visit | Attending: Specialist | Admitting: Specialist

## 2019-04-07 DIAGNOSIS — Z20828 Contact with and (suspected) exposure to other viral communicable diseases: Secondary | ICD-10-CM | POA: Insufficient documentation

## 2019-04-07 HISTORY — DX: Unspecified osteoarthritis, unspecified site: M19.90

## 2019-04-07 LAB — SARS CORONAVIRUS 2 (TAT 6-24 HRS): SARS Coronavirus 2: NEGATIVE

## 2019-04-07 NOTE — Patient Instructions (Signed)
Your procedure is scheduled on: 04-10-19 Report to Same Day Surgery 2nd floor medical mall [facility] Entrance-take elevator on left to 2nd floor.  Check in with surgery information desk.) To find out your arrival time please call [phone] between 1PM - 3PM on 04-07-19   Remember: Instructions that are not followed completely may result in serious medical risk, up to and including death, or upon the discretion of your surgeon and anesthesiologist your surgery may need to be rescheduled.    _x___ 1. Do not eat food after midnight the night before your procedure. You may drink clear liquids up to 2 hours before you are scheduled to arrive at the hospital for your procedure.  Do not drink clear liquids within 2 hours of your scheduled arrival to the hospital.  Clear liquids include  --Water or Apple juice without pulp  --Clear carbohydrate beverage such as ClearFast or Gatorade  --Black Coffee or Clear Tea (No milk, no creamers, do not add anything to the coffee or Tea   ____Ensure clear carbohydrate drink on the way to the hospital for bariatric patients  ____Ensure clear carbohydrate drink 3 hours before surgery.   No gum chewing or hard candies.     __x__ 2. No Alcohol for 24 hours before or after surgery.   __x__3. No Smoking or e-cigarettes for 24 prior to surgery.  Do not use any chewable tobacco products for at least 6 hour prior to surgery   ____  4. Bring all medications with you on the day of surgery if instructed.    __x__ 5. Notify your doctor if there is any change in your medical condition     (cold, fever, infections).    x___6. On the morning of surgery brush your teeth with toothpaste and water.  You may rinse your mouth with mouth wash if you wish.  Do not swallow any toothpaste or mouthwash.   Do not wear jewelry, make-up, hairpins, clips or nail polish.  Do not wear lotions, powders, or perfumes. You may wear deodorant.  Do not shave 48 hours prior to surgery.  Men may shave face and neck.  Do not bring valuables to the hospital.    [facility] is not responsible for any belongings or valuables.               Contacts, dentures or bridgework may not be worn into surgery.  Leave your suitcase in the car. After surgery it may be brought to your room.  For patients admitted to the hospital, discharge time is determined by your  treatment team.  _  Patients discharged the day of surgery will not be allowed to drive home.  You will need someone to drive you home and stay with you the night of your procedure.    Please read over the following fact sheets that you were given:   [facility] Preparing for Surgery   _x___ TAKE THE FOLLOWING MEDICATION THE MORNING OF SURGERY WITH A SMALL SIP OF WATER. These include:  1. METOPROLOL  2.  3.  4.  5.  6.  ____Fleets enema or Magnesium Citrate as directed.   ____ Use CHG Soap or sage wipes as directed on instruction sheet   ____ Use inhalers on the day of surgery and bring to hospital day of surgery  ____ Stop Metformin and Janumet 2 days prior to surgery.    ____ Take 1/2 of usual insulin dose the night before surgery and none on the morning  surgery.   ____ Follow recommendations from Cardiologist, Pulmonologist or PCP regarding stopping Aspirin, Coumadin, Plavix ,Eliquis, Effient, or Pradaxa, and Pletal.  X____Stop Anti-inflammatories such as Advil, Aleve, Ibuprofen, Motrin, Naproxen, Naprosyn, Goodies powders or aspirin products NOW-OK to take Tylenol -PT ALREADY STOPPED MELOXICAM   _X___ Stop supplements until after surgery-STOP KRILL OIL, CO Q 10, GLUCOSAMINE CHONDROITIN NOW   ____ Bring C-Pap to the hospital.

## 2019-04-09 MED ORDER — CLINDAMYCIN PHOSPHATE 600 MG/50ML IV SOLN
600.0000 mg | INTRAVENOUS | Status: AC
  Administered 2019-04-10: 600 mg via INTRAVENOUS

## 2019-04-09 MED ORDER — CEFAZOLIN SODIUM-DEXTROSE 2-4 GM/100ML-% IV SOLN: 2.0000 g | INTRAVENOUS | Status: DC

## 2019-04-10 ENCOUNTER — Ambulatory Visit: Payer: PRIVATE HEALTH INSURANCE | Admitting: Certified Registered Nurse Anesthetist

## 2019-04-10 ENCOUNTER — Other Ambulatory Visit: Payer: Self-pay

## 2019-04-10 ENCOUNTER — Ambulatory Visit
Admission: RE | Admit: 2019-04-10 | Discharge: 2019-04-10 | Disposition: A | Discharge status: Home / Self Care | Payer: PRIVATE HEALTH INSURANCE | Source: Ambulatory Visit | Attending: Specialist | Admitting: Specialist

## 2019-04-10 ENCOUNTER — Encounter
Admission: RE | Disposition: A | Discharge status: Home / Self Care | Payer: Self-pay | Source: Ambulatory Visit | Attending: Specialist

## 2019-04-10 DIAGNOSIS — I10 Essential (primary) hypertension: Secondary | ICD-10-CM | POA: Insufficient documentation

## 2019-04-10 DIAGNOSIS — Z20828 Contact with and (suspected) exposure to other viral communicable diseases: Secondary | ICD-10-CM | POA: Diagnosis not present

## 2019-04-10 DIAGNOSIS — G5602 Carpal tunnel syndrome, left upper limb: Secondary | ICD-10-CM | POA: Diagnosis not present

## 2019-04-10 DIAGNOSIS — Z87891 Personal history of nicotine dependence: Secondary | ICD-10-CM | POA: Insufficient documentation

## 2019-04-10 DIAGNOSIS — M1990 Unspecified osteoarthritis, unspecified site: Secondary | ICD-10-CM | POA: Diagnosis not present

## 2019-04-10 HISTORY — PX: CARPAL TUNNEL RELEASE: SHX101

## 2019-04-10 SURGERY — CARPAL TUNNEL RELEASE
Anesthesia: General | Site: Wrist | Laterality: Left

## 2019-04-10 MED ORDER — ONDANSETRON HCL 4 MG/2ML IJ SOLN: 4.0000 mg | Freq: Once | INTRAMUSCULAR | Status: DC | PRN

## 2019-04-10 MED ORDER — PROPOFOL 10 MG/ML IV BOLUS
INTRAVENOUS | Status: DC | PRN
  Administered 2019-04-10: 20 mg via INTRAVENOUS
  Administered 2019-04-10: 150 mg via INTRAVENOUS
  Administered 2019-04-10: 30 mg via INTRAVENOUS

## 2019-04-10 MED ORDER — GABAPENTIN 300 MG PO CAPS
ORAL_CAPSULE | ORAL | Status: AC
  Filled 2019-04-10: qty 1

## 2019-04-10 MED ORDER — MELOXICAM 7.5 MG PO TABS
ORAL_TABLET | ORAL | Status: AC
  Filled 2019-04-10: qty 2

## 2019-04-10 MED ORDER — MIDAZOLAM HCL 2 MG/2ML IJ SOLN
INTRAMUSCULAR | Status: AC
  Filled 2019-04-10: qty 2

## 2019-04-10 MED ORDER — ONDANSETRON HCL 4 MG/2ML IJ SOLN
INTRAMUSCULAR | Status: DC | PRN
  Administered 2019-04-10: 4 mg via INTRAVENOUS

## 2019-04-10 MED ORDER — FENTANYL CITRATE (PF) 100 MCG/2ML IJ SOLN
INTRAMUSCULAR | Status: DC | PRN
  Administered 2019-04-10 (×3): 25 ug via INTRAVENOUS

## 2019-04-10 MED ORDER — FAMOTIDINE 20 MG PO TABS
ORAL_TABLET | ORAL | Status: AC
  Filled 2019-04-10: qty 1

## 2019-04-10 MED ORDER — LIDOCAINE HCL (CARDIAC) PF 100 MG/5ML IV SOSY
PREFILLED_SYRINGE | INTRAVENOUS | Status: DC | PRN
  Administered 2019-04-10: 100 mg via INTRAVENOUS

## 2019-04-10 MED ORDER — ONDANSETRON HCL 4 MG/2ML IJ SOLN
INTRAMUSCULAR | Status: AC
  Filled 2019-04-10: qty 2

## 2019-04-10 MED ORDER — MELOXICAM 15 MG PO TABS: 15.0000 mg | ORAL_TABLET | Freq: Every day | ORAL | 3 refills | Status: DC

## 2019-04-10 MED ORDER — GABAPENTIN 300 MG PO CAPS
300.0000 mg | ORAL_CAPSULE | ORAL | Status: AC
  Administered 2019-04-10: 300 mg via ORAL

## 2019-04-10 MED ORDER — FENTANYL CITRATE (PF) 100 MCG/2ML IJ SOLN
INTRAMUSCULAR | Status: AC
  Filled 2019-04-10: qty 2

## 2019-04-10 MED ORDER — PROPOFOL 10 MG/ML IV BOLUS
INTRAVENOUS | Status: AC
  Filled 2019-04-10: qty 20

## 2019-04-10 MED ORDER — CHLORHEXIDINE GLUCONATE CLOTH 2 % EX PADS: 6.0000 | MEDICATED_PAD | Freq: Once | CUTANEOUS | Status: DC

## 2019-04-10 MED ORDER — HYDROCODONE-ACETAMINOPHEN 5-325 MG PO TABS: 1.0000 | ORAL_TABLET | Freq: Four times a day (QID) | ORAL | 0 refills | Status: DC | PRN

## 2019-04-10 MED ORDER — CLINDAMYCIN PHOSPHATE 600 MG/50ML IV SOLN
INTRAVENOUS | Status: AC
  Filled 2019-04-10: qty 50

## 2019-04-10 MED ORDER — MELOXICAM 7.5 MG PO TABS
15.0000 mg | ORAL_TABLET | ORAL | Status: AC
  Administered 2019-04-10: 15 mg via ORAL

## 2019-04-10 MED ORDER — FENTANYL CITRATE (PF) 100 MCG/2ML IJ SOLN: 25.0000 ug | INTRAMUSCULAR | Status: DC | PRN

## 2019-04-10 MED ORDER — LACTATED RINGERS IV SOLN
INTRAVENOUS | Status: DC
  Administered 2019-04-10: 09:00:00 via INTRAVENOUS

## 2019-04-10 MED ORDER — FAMOTIDINE 20 MG PO TABS
20.0000 mg | ORAL_TABLET | Freq: Once | ORAL | Status: AC
  Administered 2019-04-10: 20 mg via ORAL

## 2019-04-10 MED ORDER — MIDAZOLAM HCL 2 MG/2ML IJ SOLN
INTRAMUSCULAR | Status: DC | PRN
  Administered 2019-04-10: 2 mg via INTRAVENOUS

## 2019-04-10 MED ORDER — BUPIVACAINE HCL 0.5 % IJ SOLN
INTRAMUSCULAR | Status: DC | PRN
  Administered 2019-04-10: 17 mL

## 2019-04-10 MED ORDER — LIDOCAINE HCL (PF) 2 % IJ SOLN
INTRAMUSCULAR | Status: AC
  Filled 2019-04-10: qty 10

## 2019-04-10 MED ORDER — PHENYLEPHRINE HCL (PRESSORS) 10 MG/ML IV SOLN
INTRAVENOUS | Status: AC
  Filled 2019-04-10: qty 1

## 2019-04-10 MED ORDER — SEVOFLURANE IN SOLN
RESPIRATORY_TRACT | Status: AC
  Filled 2019-04-10: qty 250

## 2019-04-10 MED ORDER — BUPIVACAINE HCL (PF) 0.5 % IJ SOLN
INTRAMUSCULAR | Status: AC
  Filled 2019-04-10: qty 30

## 2019-04-10 MED ORDER — PHENYLEPHRINE HCL (PRESSORS) 10 MG/ML IV SOLN
INTRAVENOUS | Status: DC | PRN
  Administered 2019-04-10: 100 ug via INTRAVENOUS
  Administered 2019-04-10: 200 ug via INTRAVENOUS
  Administered 2019-04-10 (×3): 100 ug via INTRAVENOUS

## 2019-04-10 SURGICAL SUPPLY — 28 items
BLADE SURG MINI STRL (BLADE) ×2 IMPLANT
BNDG ESMARK 4X12 TAN STRL LF (GAUZE/BANDAGES/DRESSINGS) ×2 IMPLANT
CANISTER SUCT 1200ML W/VALVE (MISCELLANEOUS) ×2 IMPLANT
CHLORAPREP W/TINT 26 (MISCELLANEOUS) ×2 IMPLANT
COVER WAND RF STERILE (DRAPES) ×2 IMPLANT
CUFF TOURN SGL QUICK 18X4 (TOURNIQUET CUFF) ×1 IMPLANT
DRSG GAUZE FLUFF 36X18 (GAUZE/BANDAGES/DRESSINGS) ×2 IMPLANT
ELECT REM PT RETURN 9FT ADLT (ELECTROSURGICAL) ×2
ELECTRODE REM PT RTRN 9FT ADLT (ELECTROSURGICAL) ×1 IMPLANT
GAUZE XEROFORM 1X8 LF (GAUZE/BANDAGES/DRESSINGS) ×2 IMPLANT
GLOVE BIO SURGEON STRL SZ8 (GLOVE) ×2 IMPLANT
GOWN STRL REUS W/ TWL LRG LVL3 (GOWN DISPOSABLE) ×1 IMPLANT
GOWN STRL REUS W/TWL LRG LVL3 (GOWN DISPOSABLE) ×1
GOWN STRL REUS W/TWL LRG LVL4 (GOWN DISPOSABLE) ×2 IMPLANT
KIT TURNOVER KIT A (KITS) ×2 IMPLANT
NS IRRIG 500ML POUR BTL (IV SOLUTION) ×2 IMPLANT
PACK EXTREMITY ARMC (MISCELLANEOUS) ×2 IMPLANT
PAD PREP 24X41 OB/GYN DISP (PERSONAL CARE ITEMS) ×2 IMPLANT
PADDING CAST 4IN STRL (MISCELLANEOUS) ×1
PADDING CAST BLEND 4X4 STRL (MISCELLANEOUS) ×1 IMPLANT
SPLINT CAST 1 STEP 3X12 (MISCELLANEOUS) ×2 IMPLANT
STOCKINETTE 48X4 2 PLY STRL (GAUZE/BANDAGES/DRESSINGS) ×1 IMPLANT
STOCKINETTE BIAS CUT 4 980044 (GAUZE/BANDAGES/DRESSINGS) ×2 IMPLANT
STOCKINETTE STRL 4IN 9604848 (GAUZE/BANDAGES/DRESSINGS) ×2 IMPLANT
SUT ETHILON 4-0 (SUTURE) ×1
SUT ETHILON 4-0 FS2 18XMFL BLK (SUTURE) ×1
SUT ETHILON 5-0 FS-2 18 BLK (SUTURE) ×2 IMPLANT
SUTURE ETHLN 4-0 FS2 18XMF BLK (SUTURE) ×1 IMPLANT

## 2019-04-10 NOTE — Anesthesia Post-op Follow-up Note (Signed)
Anesthesia QCDR form completed.        

## 2019-04-10 NOTE — Anesthesia Procedure Notes (Signed)
Procedure Name: LMA Insertion Date/Time: 04/10/2019 10:17 AM Performed by: Caryl Asp, CRNA Pre-anesthesia Checklist: Patient identified, Patient being monitored, Timeout performed, Emergency Drugs available and Suction available Patient Re-evaluated:Patient Re-evaluated prior to induction Oxygen Delivery Method: Circle system utilized Preoxygenation: Pre-oxygenation with 100% oxygen Induction Type: IV induction Ventilation: Mask ventilation without difficulty LMA: LMA inserted LMA Size: 4.5 Tube type: Oral Number of attempts: 1 Placement Confirmation: positive ETCO2 and breath sounds checked- equal and bilateral Tube secured with: Tape Dental Injury: Teeth and Oropharynx as per pre-operative assessment

## 2019-04-10 NOTE — Transfer of Care (Signed)
Immediate Anesthesia Transfer of Care Note  Patient: Stephen Ponce  Procedure(s) Performed: CARPAL TUNNEL RELEASE (Left Wrist)  Patient Location: PACU  Anesthesia Type:General  Level of Consciousness: drowsy  Airway & Oxygen Therapy: Patient Spontanous Breathing and Patient connected to face mask oxygen  Post-op Assessment: Report given to RN and Post -op Vital signs reviewed and stable  Post vital signs: Reviewed and stable  Last Vitals:  Vitals Value Taken Time  BP 123/78 04/10/19 1114  Temp 36.6 C 04/10/19 1114  Pulse 59 04/10/19 1116  Resp 15 04/10/19 1116  SpO2 100 % 04/10/19 1116  Vitals shown include unvalidated device data.  Last Pain:  Vitals:   04/10/19 1114  TempSrc:   PainSc: Asleep         Complications: No apparent anesthesia complications

## 2019-04-10 NOTE — Op Note (Signed)
04/10/2019  11:05 AM  PATIENT:  Stephen Ponce    PRE-OPERATIVE DIAGNOSIS: LEFT CARPAL TUNNEL SYNDROME  POST-OPERATIVE DIAGNOSIS: LEFT CARPAL TUNNEL SYNDROME  PROCEDURE:  LEFT CARPAL TUNNEL RELEASE  SURGEON: Park Breed, MD  TOURNIQUET TIME: 22  MIN   ANESTHESIA:   General  PREOPERATIVE INDICATIONS:  Stephen Ponce is a  73 y.o. male with a diagnosis of left carpal tunnel syndrome who failed conservative measures and elected for surgical management.    The risks benefits and alternatives were discussed with the patient preoperatively including but not limited to the risks of infection, bleeding, nerve injury, incomplete relief of symptoms, pillar pain, cardiopulmonary complications, the need for revision surgery, among others, and the patient was willing to proceed.  OPERATIVE FINDINGS: Thickened volar ligament and nerve compression.  OPERATIVE PROCEDURE: The patient is brought to the operating room placed in the supine position. General anesthesia was administered. The left upper extremity was prepped and draped in usual sterile fashion. Time out was performed. The arm was elevated and exsanguinated and the tourniquet was inflated. Incision was made in line with the radial border of the ring finger. The carpal tunnel transverse fascia was identified, cleaned, and incised sharply. The common sensory branches were visualized along with the superficial palmar arch and protected.  The median nerve was protected below. A Kelly clamp was  placed underneath the transverse carpal ligament, protecting the nerve. I released the ligament completely, and then released the proximal distal volar forearm fascia. The nerve was identified, and visualized, and protected throughout the case. The motor branch was intact upon inspection. No masses or abnormalities were identified in the ulnar bursa.  The wounds were irrigated copiously and the skin closed with nylon. The wound was injected with 1/2 %  marcaine followed by a sterile dressing and volar splint. Tourniquet was deflated with good return of blood flow to all fingers. Sponge and needle counts were correct.  The patient tolerated this well, with no complications. The patient was awakened and taken to recovery in good condition.

## 2019-04-10 NOTE — Discharge Instructions (Signed)

## 2019-04-10 NOTE — Anesthesia Preprocedure Evaluation (Signed)
Anesthesia Evaluation  Patient identified by MRN, date of birth, ID band Patient awake    Reviewed: Allergy & Precautions, H&P , NPO status , Patient's Chart, lab work & pertinent test results, reviewed documented beta blocker date and time   Airway Mallampati: II  TM Distance: >3 FB Neck ROM: full    Dental  (+) Teeth Intact   Pulmonary neg pulmonary ROS, former smoker,    Pulmonary exam normal        Cardiovascular Exercise Tolerance: Poor hypertension, On Medications negative cardio ROS Normal cardiovascular exam Rate:Normal     Neuro/Psych negative neurological ROS  negative psych ROS   GI/Hepatic negative GI ROS, Neg liver ROS,   Endo/Other  negative endocrine ROS  Renal/GU Renal disease  negative genitourinary   Musculoskeletal   Abdominal   Peds  Hematology negative hematology ROS (+)   Anesthesia Other Findings   Reproductive/Obstetrics negative OB ROS                             Anesthesia Physical Anesthesia Plan  ASA: III  Anesthesia Plan: General LMA   Post-op Pain Management:    Induction:   PONV Risk Score and Plan:   Airway Management Planned:   Additional Equipment:   Intra-op Plan:   Post-operative Plan:   Informed Consent: I have reviewed the patients History and Physical, chart, labs and discussed the procedure including the risks, benefits and alternatives for the proposed anesthesia with the patient or authorized representative who has indicated his/her understanding and acceptance.       Plan Discussed with: CRNA  Anesthesia Plan Comments:         Anesthesia Quick Evaluation

## 2019-04-10 NOTE — H&P (Signed)
THE PATIENT WAS SEEN PRIOR TO SURGERY TODAY.  HISTORY, ALLERGIES, HOME MEDICATIONS AND OPERATIVE PROCEDURE WERE REVIEWED. RISKS AND BENEFITS OF SURGERY DISCUSSED WITH PATIENT AGAIN.  NO CHANGES FROM INITIAL HISTORY AND PHYSICAL NOTED.    

## 2019-04-17 ENCOUNTER — Encounter: Payer: Self-pay | Admitting: Specialist

## 2019-04-18 ENCOUNTER — Ambulatory Visit: Payer: Medicare HMO | Admitting: Family Medicine

## 2019-04-19 DIAGNOSIS — R491 Aphonia: Secondary | ICD-10-CM | POA: Insufficient documentation

## 2019-04-20 NOTE — Anesthesia Postprocedure Evaluation (Signed)
Anesthesia Post Note  Patient: Stephen Ponce  Procedure(s) Performed: CARPAL TUNNEL RELEASE (Left Wrist)  Patient location during evaluation: PACU Anesthesia Type: General Level of consciousness: awake and alert Pain management: pain level controlled Vital Signs Assessment: post-procedure vital signs reviewed and stable Respiratory status: spontaneous breathing, nonlabored ventilation, respiratory function stable and patient connected to nasal cannula oxygen Cardiovascular status: blood pressure returned to baseline and stable Postop Assessment: no apparent nausea or vomiting Anesthetic complications: no     Last Vitals:  Vitals:   04/10/19 1159 04/10/19 1207  BP: 108/80 123/74  Pulse: (!) 59 78  Resp: 14 16  Temp:  (!) 36.4 C  SpO2: 96% 97%    Last Pain:  Vitals:   04/11/19 0837  TempSrc:   PainSc: 2                  Molli Barrows

## 2019-05-02 ENCOUNTER — Other Ambulatory Visit: Payer: Self-pay

## 2019-05-02 ENCOUNTER — Encounter: Payer: Self-pay | Admitting: Family Medicine

## 2019-05-02 ENCOUNTER — Ambulatory Visit (INDEPENDENT_AMBULATORY_CARE_PROVIDER_SITE_OTHER): Payer: Medicare HMO | Admitting: Family Medicine

## 2019-05-02 VITALS — BP 110/60 | HR 75 | Temp 96.9°F | Resp 16 | Ht 69.0 in | Wt 228.6 lb

## 2019-05-02 DIAGNOSIS — H6122 Impacted cerumen, left ear: Secondary | ICD-10-CM | POA: Diagnosis not present

## 2019-05-02 DIAGNOSIS — Z0000 Encounter for general adult medical examination without abnormal findings: Secondary | ICD-10-CM | POA: Diagnosis not present

## 2019-05-02 DIAGNOSIS — Z6835 Body mass index (BMI) 35.0-35.9, adult: Secondary | ICD-10-CM | POA: Diagnosis not present

## 2019-05-02 DIAGNOSIS — E66812 Obesity, class 2: Secondary | ICD-10-CM

## 2019-05-02 NOTE — Progress Notes (Signed)
Patient: Stephen Ponce, Male    DOB: 1945/10/18, 73 y.o.   MRN: [id number] Visit Date: 05/02/2019  Today's Provider: Wilhemena Durie, MD   Chief Complaint  Patient presents with  . Annual Exam   Subjective:     Complete Physical Stephen Ponce is a 73 y.o. male. He feels fairly well, patient reports concern of wax bulid up in he is left ear for the past 5 days. He reports he is not exercising . He reports he is sleeping fairly well.  His left ear is stopped up. He had Left CTS surgery 04/10/19 and has since had a very hoarse ,low level voice. ENT evaluation underway. Melanoma followed by Dr Raliegh Ip.  -----------------------------------------------------------   Review of Systems  Constitutional: Negative.   HENT: Positive for hearing loss and trouble swallowing.   Eyes: Negative.   Respiratory: Negative.   Cardiovascular: Negative.   Gastrointestinal: Negative.   Endocrine: Negative.   Genitourinary: Negative.   Musculoskeletal: Negative.   Skin: Negative.   Allergic/Immunologic: Negative.   Neurological: Negative.   Hematological: Negative.   Psychiatric/Behavioral: Negative.     Social History   Socioeconomic History  . Marital status: Married    Spouse name: Not on file  . Number of children: Not on file  . Years of education: Not on file  . Highest education level: Not on file  Occupational History  . Not on file  Social Needs  . Financial resource strain: Not on file  . Food insecurity    Worry: Not on file    Inability: Not on file  . Transportation needs    Medical: Not on file    Non-medical: Not on file  Tobacco Use  . Smoking status: Former Smoker    Packs/day: 3.00    Years: 35.00    Pack years: 105.00    Types: Cigarettes    Quit date: 09/07/1996    Years since quitting: 22.6  . Smokeless tobacco: Never Used  Substance and Sexual Activity  . Alcohol use: Yes    Alcohol/week: 0.0 standard drinks    Comment: ocassionally  . Drug  use: No  . Sexual activity: Not on file  Lifestyle  . Physical activity    Days per week: Not on file    Minutes per session: Not on file  . Stress: Not on file  Relationships  . Social Herbalist on phone: Not on file    Gets together: Not on file    Attends religious service: Not on file    Active member of club or organization: Not on file    Attends meetings of clubs or organizations: Not on file    Relationship status: Not on file  . Intimate partner violence    Fear of current or ex partner: Not on file    Emotionally abused: Not on file    Physically abused: Not on file    Forced sexual activity: Not on file  Other Topics Concern  . Not on file  Social History Narrative  . Not on file    Past Medical History:  Diagnosis Date  . Arthritis   . DVT (deep venous thrombosis) ([place]) 07/2018   LEFT LEG  . Hypertension   . Renal failure    5 years ago with sepsis/uti     Patient Active Problem List   Diagnosis Date Noted  . Loss of voice 04/19/2019  . History of arthroscopic procedure  on shoulder 07/22/2018  . Sprain of knee 02/17/2018  . Shoulder strain 02/17/2018  . Cervical radiculitis 02/17/2018  . 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 CARPAL TUNNEL RELEASE Left 04/10/2019   Procedure: CARPAL TUNNEL RELEASE;  Surgeon: Earnestine Leys, MD;  Location: [facility] ORS;  Service: Orthopedics;  Laterality: Left;  . COLONOSCOPY  08/09/2014   tubular adenoma  . OSTEOTOMY    . SHOULDER ARTHROSCOPY WITH OPEN ROTATOR CUFF REPAIR Left 07/18/2018   Procedure: SHOULDER ARTHROSCOPY WITH OPEN ROTATOR CUFF REPAIR;  Surgeon: Earnestine Leys, MD;  Location: [facility] ORS;  Service: Orthopedics;  Laterality: Left;    His family history includes Bone cancer in his brother; CAD in his father; Lung cancer in his brother; Stomach cancer in his brother.   Current  Outpatient Medications:  .  meloxicam (MOBIC) 15 MG tablet, Take 1 tablet (15 mg total) by mouth daily., Disp: 30 tablet, Rfl: 3 .  metoprolol succinate (TOPROL-XL) 25 MG 24 hr tablet, Take 0.5 tablets (12.5 mg total) by mouth daily. (Patient taking differently: Take 12.5 mg by mouth every morning. ), Disp: 90 tablet, Rfl: 3 .  Ascorbic Acid (VITAMIN C) 1000 MG tablet, Take 1,000 mg by mouth daily. , Disp: , Rfl:  .  Cholecalciferol (VITAMIN D3) 1000 units CAPS, Take 1,000 Units by mouth daily., Disp: , Rfl:  .  Cyanocobalamin (VITAMIN B 12 PO), Take 1 tablet by mouth daily as needed (pt pref). , Disp: , Rfl:  .  cyclobenzaprine (FLEXERIL) 5 MG tablet, Take 1 tablet (5 mg total) by mouth 3 (three) times daily as needed for muscle spasms. (Patient not taking: Reported on 07/12/2018), Disp: 30 tablet, Rfl: 0 .  furosemide (LASIX) 20 MG tablet, TAKE 1 TABLET BY MOUTH TWICE A DAY (Patient not taking: Reported on 10/26/2018), Disp: 60 tablet, Rfl: 4 .  gabapentin (NEURONTIN) 400 MG capsule, Take 1 capsule (400 mg total) by mouth 3 (three) times daily. (Patient not taking: Reported on 09/20/2018), Disp: 60 capsule, Rfl: 3 .  Glucos-Chond-Sterol-Fish Oil (GLUCOSAMINE CHONDROITIN PLUS) CAPS, Take 1 tablet by mouth daily. , Disp: , Rfl:  .  HYDROcodone-acetaminophen (NORCO) 5-325 MG tablet, Take 1-2 tablets by mouth every 6 (six) hours as needed. (Patient not taking: Reported on 05/02/2019), Disp: 50 tablet, Rfl: 0 .  Krill Oil 350 MG CAPS, Take 350 mg by mouth daily. , Disp: , Rfl:  .  meloxicam (MOBIC) 15 MG tablet, Take 15 mg by mouth daily., Disp: , Rfl:  .  Multiple Vitamin tablet, Take 1 tablet by mouth daily. , Disp: , Rfl:  .  Pyridoxine HCl (VITAMIN B6) 200 MG TABS, Take 200 mg by mouth daily. , Disp: , Rfl:  .  Ubiquinol 100 MG CAPS, Take 100 mg by mouth daily. , Disp: , Rfl:   Patient Care Team: Jerrol Banana., MD as PCP - General (Family Medicine)     Objective:    Vitals: BP 110/60    Pulse 75   Temp (!) 96.9 F (36.1 C) (Other (Comment))   Resp 16   Ht 5\' 9"  (1.753 m)   Wt 228 lb 9.6 oz (103.7 kg)   SpO2 95%   BMI 33.76 kg/m   Physical Exam Vitals signs reviewed.  Constitutional:      Appearance: He is well-developed.  HENT:     Head: Normocephalic and atraumatic.  Right Ear: Tympanic membrane, ear canal and external ear normal.     Left Ear: Tympanic membrane and external ear normal.     Ears:     Comments: Left EAC blocked with cerumen--removed.    Nose: Nose normal.     Mouth/Throat:     Comments: Low level,hoarse voice. Eyes:     General: No scleral icterus.    Conjunctiva/sclera: Conjunctivae normal.  Neck:     Musculoskeletal: Neck supple.     Thyroid: No thyromegaly.     Vascular: No carotid bruit.  Cardiovascular:     Rate and Rhythm: Normal rate and regular rhythm.     Heart sounds: Normal heart sounds.  Pulmonary:     Effort: Pulmonary effort is normal.     Breath sounds: Normal breath sounds.  Abdominal:     Palpations: Abdomen is soft.  Musculoskeletal:     Left lower leg: Edema present.     Comments: Trace left edema.  Lymphadenopathy:     Cervical: No cervical adenopathy.  Skin:    General: Skin is warm and dry.  Neurological:     General: No focal deficit present.     Mental Status: He is alert and oriented to person, place, and time.  Psychiatric:        Mood and Affect: Mood normal.        Behavior: Behavior normal.        Thought Content: Thought content normal.        Judgment: Judgment normal.     Activities of Daily Living In your present state of health, do you have any difficulty performing the following activities: 04/07/2019 07/13/2018  Hearing? N N  Vision? N N  Difficulty concentrating or making decisions? N N  Walking or climbing stairs? N Y  Comment - bad knees  Dressing or bathing? N N  Doing errands, shopping? N -  Some recent data might be hidden    Fall Risk Assessment Fall Risk  10/26/2018  08/24/2017 08/24/2016 08/22/2015  Falls in the past year? 0 No No No     Depression Screen PHQ 2/9 Scores 10/26/2018 08/24/2017 08/24/2016 08/22/2015  PHQ - 2 Score 0 0 0 0    No flowsheet data found.     Assessment & Plan:    Annual Physical Reviewed patient's Family Medical History Reviewed and updated list of patient's medical providers Assessment of cognitive impairment was done Assessed patient's functional ability Established a written schedule for health screening [place] Completed and Reviewed  Exercise Activities and Dietary recommendations Goals   None     Immunization History  Administered Date(s) Administered  . Pneumococcal Conjugate-13 06/28/2014  . Pneumococcal Polysaccharide-23 08/24/2016  . Td 05/11/2000  . Tdap 12/07/2014  . Zoster 12/07/2014    Health Maintenance  Topic Date Due  . INFLUENZA VACCINE  04/08/2019  . COLONOSCOPY  08/10/2019  . TETANUS/TDAP  12/06/2024  . Hepatitis C Screening  Completed  . PNA vac Low Risk Adult  Completed     Discussed health benefits of physical activity, and encouraged him to engage in regular exercise appropriate for his age and condition.  1. Annual physical exam  - Comprehensive metabolic panel - CBC with Differential/Platelet - Lipid panel - TSH  2. Class 2 severe obesity due to excess calories with serious comorbidity and body mass index (BMI) of 35.0 to 35.9 in adult [facility]) With OA/HTN/HLD  3. Impacted cerumen of left ear Cleared.    ------------------------------------------------------------------------------------------------------------  Richard Cranford Mon, MD  [employer] Fritzi Mandes [place],acting as a scribe for Wilhemena Durie, MD.,have documented all relevant documentation on the behalf of Wilhemena Durie, MD,as directed by  Wilhemena Durie, MD while in the presence of Wilhemena Durie, MD.

## 2019-05-03 DIAGNOSIS — Z0000 Encounter for general adult medical examination without abnormal findings: Secondary | ICD-10-CM | POA: Diagnosis not present

## 2019-05-04 LAB — CBC WITH DIFFERENTIAL/PLATELET
Basophils Absolute: 0.1 10*3/uL (ref 0.0–0.2)
Basos: 1 %
EOS (ABSOLUTE): 0.3 10*3/uL (ref 0.0–0.4)
Eos: 4 %
Hematocrit: 48.8 % (ref 37.5–51.0)
Hemoglobin: 16.3 g/dL (ref 13.0–17.7)
Immature Grans (Abs): 0 10*3/uL (ref 0.0–0.1)
Immature Granulocytes: 0 %
Lymphocytes Absolute: 1.4 10*3/uL (ref 0.7–3.1)
Lymphs: 18 %
MCH: 30.4 pg (ref 26.6–33.0)
MCHC: 33.4 g/dL (ref 31.5–35.7)
MCV: 91 fL (ref 79–97)
Monocytes Absolute: 0.8 10*3/uL (ref 0.1–0.9)
Monocytes: 10 %
Neutrophils Absolute: 5.1 10*3/uL (ref 1.4–7.0)
Neutrophils: 67 %
Platelets: 202 10*3/uL (ref 150–450)
RBC: 5.37 x10E6/uL (ref 4.14–5.80)
RDW: 12.9 % (ref 11.6–15.4)
WBC: 7.7 10*3/uL (ref 3.4–10.8)

## 2019-05-04 LAB — COMPREHENSIVE METABOLIC PANEL
ALT: 30 IU/L (ref 0–44)
AST: 23 IU/L (ref 0–40)
Albumin/Globulin Ratio: 1.6 (ref 1.2–2.2)
Albumin: 4.2 g/dL (ref 3.7–4.7)
Alkaline Phosphatase: 50 IU/L (ref 39–117)
BUN/Creatinine Ratio: 22 (ref 10–24)
BUN: 16 mg/dL (ref 8–27)
Bilirubin Total: 1.1 mg/dL (ref 0.0–1.2)
CO2: 19 mmol/L — ABNORMAL LOW (ref 20–29)
Calcium: 9.2 mg/dL (ref 8.6–10.2)
Chloride: 100 mmol/L (ref 96–106)
Creatinine, Ser: 0.73 mg/dL — ABNORMAL LOW (ref 0.76–1.27)
GFR calc Af Amer: 106 mL/min/{1.73_m2} (ref >59)
GFR calc non Af Amer: 92 mL/min/{1.73_m2} (ref >59)
Globulin, Total: 2.6 g/dL (ref 1.5–4.5)
Glucose: 98 mg/dL (ref 65–99)
Potassium: 4.5 mmol/L (ref 3.5–5.2)
Sodium: 138 mmol/L (ref 134–144)
Total Protein: 6.8 g/dL (ref 6.0–8.5)

## 2019-05-04 LAB — LIPID PANEL
Chol/HDL Ratio: 4 ratio (ref 0.0–5.0)
Cholesterol, Total: 158 mg/dL (ref 100–199)
HDL: 40 mg/dL (ref >39)
LDL Calculated: 92 mg/dL (ref 0–99)
Triglycerides: 128 mg/dL (ref 0–149)
VLDL Cholesterol Cal: 26 mg/dL (ref 5–40)

## 2019-05-04 LAB — TSH: TSH: 1.44 u[IU]/mL (ref 0.450–4.500)

## 2019-06-21 DIAGNOSIS — R69 Illness, unspecified: Secondary | ICD-10-CM | POA: Diagnosis not present

## 2019-08-02 DIAGNOSIS — R69 Illness, unspecified: Secondary | ICD-10-CM | POA: Diagnosis not present

## 2019-11-16 ENCOUNTER — Other Ambulatory Visit: Payer: Self-pay

## 2019-11-16 DIAGNOSIS — L820 Inflamed seborrheic keratosis: Secondary | ICD-10-CM | POA: Diagnosis not present

## 2019-11-16 DIAGNOSIS — D485 Neoplasm of uncertain behavior of skin: Secondary | ICD-10-CM | POA: Diagnosis not present

## 2019-11-16 DIAGNOSIS — Z85828 Personal history of other malignant neoplasm of skin: Secondary | ICD-10-CM | POA: Diagnosis not present

## 2019-11-16 DIAGNOSIS — L578 Other skin changes due to chronic exposure to nonionizing radiation: Secondary | ICD-10-CM | POA: Diagnosis not present

## 2019-11-16 DIAGNOSIS — L570 Actinic keratosis: Secondary | ICD-10-CM | POA: Diagnosis not present

## 2019-12-16 ENCOUNTER — Other Ambulatory Visit: Payer: Self-pay | Admitting: Family Medicine

## 2019-12-16 NOTE — Telephone Encounter (Signed)
Courtesy refill given Requested Prescriptions  Pending Prescriptions Disp Refills  . metoprolol succinate (TOPROL-XL) 25 MG 24 hr tablet [Pharmacy Med Name: METOPROLOL SUCC ER 25 MG TAB] 15 tablet 0    Sig: TAKE 1/2 TABLET BY MOUTH EVERY DAY     Cardiovascular:  Beta Blockers Failed - 12/16/2019  9:37 AM      Failed - Valid encounter within last 6 months    Recent Outpatient Visits          7 months ago Annual physical exam   [facility] Jerrol Banana., MD   11 months ago Acute deep vein thrombosis (DVT) of popliteal vein of left lower extremity [facility])   [facility] Jerrol Banana., MD   1 year ago Acute deep vein thrombosis (DVT) of popliteal vein of left lower extremity [facility])   [facility] Jerrol Banana., MD   1 year ago Acute deep vein thrombosis (DVT) of popliteal vein of left lower extremity [facility])   [facility] Jerrol Banana., MD   1 year ago Lower extremity edema   [facility] Jerrol Banana., MD      Future Appointments            In 2 weeks Ralene Bathe, MD [place] BP in normal range    BP Readings from Last 1 Encounters:  05/02/19 110/60         Passed - Last Heart Rate in normal range    Pulse Readings from Last 1 Encounters:  05/02/19 75

## 2019-12-20 DIAGNOSIS — H25043 Posterior subcapsular polar age-related cataract, bilateral: Secondary | ICD-10-CM | POA: Diagnosis not present

## 2019-12-27 DIAGNOSIS — R69 Illness, unspecified: Secondary | ICD-10-CM | POA: Diagnosis not present

## 2020-01-04 ENCOUNTER — Other Ambulatory Visit: Payer: Self-pay

## 2020-01-04 ENCOUNTER — Ambulatory Visit: Payer: Medicare HMO | Admitting: Dermatology

## 2020-01-04 DIAGNOSIS — Z86007 Personal history of in-situ neoplasm of skin: Secondary | ICD-10-CM

## 2020-01-04 DIAGNOSIS — L578 Other skin changes due to chronic exposure to nonionizing radiation: Secondary | ICD-10-CM

## 2020-01-04 DIAGNOSIS — L570 Actinic keratosis: Secondary | ICD-10-CM

## 2020-01-04 DIAGNOSIS — Z872 Personal history of diseases of the skin and subcutaneous tissue: Secondary | ICD-10-CM | POA: Diagnosis not present

## 2020-01-04 NOTE — Patient Instructions (Signed)

## 2020-01-04 NOTE — Progress Notes (Signed)
   Follow-Up Visit   Subjective  Stephen Ponce is a 74 y.o. male who presents for the following: Follow-up (Biopsy follow up - AK of right med cheek 2.5 cm from lower lid. LN2 today and consider topical treatment.).    The following portions of the chart were reviewed this encounter and updated as appropriate:  Tobacco  Allergies  Meds  Problems  Med Hx  Surg Hx  Fam Hx      Review of Systems:  No other skin or systemic complaints except as noted in HPI or Assessment and Plan.  Objective  Well appearing patient in no apparent distress; mood and affect are within normal limits.  A focused examination was performed including face. Relevant physical exam findings are noted in the Assessment and Plan.  Objective  Right Zygomatic Area, Right med cheek 2.5 cm from lower lid (biopsy proven): Erythematous thin papules/macules with gritty scale.    Assessment & Plan    Actinic Damage - diffuse scaly erythematous macules with underlying dyspigmentation - Recommend daily broad spectrum sunscreen SPF 30+ to sun-exposed areas, reapply every 2 hours as needed.  - Call for new or changing lesions. 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 30g 1RF sent to [facility].  Destruction of lesion - Right Zygomatic Area, Right med cheek 2.5 cm from lower lid (biopsy proven) Complexity: simple   Destruction method: cryotherapy   Informed consent: discussed and consent obtained   Timeout:  patient name, date of birth, surgical site, and procedure verified Lesion destroyed using liquid nitrogen: Yes   Region frozen until ice ball extended beyond lesion: Yes   Outcome: patient tolerated procedure well with no complications   Post-procedure details: wound care instructions given    History of actinic  keratoses Left Forehead Clear. Observe for recurrence. Call clinic for new or changing lesions.  Recommend regular skin exams, daily broad-spectrum spf 30+ sunscreen use, and photoprotection.    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 Observe for recurrence. Call clinic for new or changing lesions.  Recommend regular skin exams, daily broad-spectrum spf 30+ sunscreen use, and photoprotection.   But this is site of bx proven AK.  Return in about 3 months (around 04/04/2020).  I, Ashok Cordia, CMA, am acting as scribe for Sarina Ser, MD .    Documentation: I have reviewed the above documentation for accuracy and completeness, and I agree with the above.  Sarina Ser, MD

## 2020-01-05 ENCOUNTER — Encounter: Payer: Self-pay | Admitting: Dermatology

## 2020-01-16 ENCOUNTER — Other Ambulatory Visit: Payer: Self-pay | Admitting: Family Medicine

## 2020-01-16 NOTE — Telephone Encounter (Signed)
Requested medication (s) are due for refill today: yes  Requested medication (s) are on the active medication list: yes  Last refill:  12/16/19  Future visit scheduled: 05/02/20  Notes to clinic: Courtesy refill given on 12/16/19- please review   Requested Prescriptions  Pending Prescriptions Disp Refills   metoprolol succinate (TOPROL-XL) 25 MG 24 hr tablet [Pharmacy Med Name: METOPROLOL SUCC ER 25 MG TAB] 15 tablet 0    Sig: TAKE 1/2 TABLET BY MOUTH EVERY DAY      Cardiovascular:  Beta Blockers Failed - 01/16/2020 12:30 PM      Failed - Valid encounter within last 6 months    Recent Outpatient Visits           8 months ago Annual physical exam   [facility] Jerrol Banana., MD   1 year ago Acute deep vein thrombosis (DVT) of popliteal vein of left lower extremity [facility])   [facility] Jerrol Banana., MD   1 year ago Acute deep vein thrombosis (DVT) of popliteal vein of left lower extremity [facility])   [facility] Jerrol Banana., MD   1 year ago Acute deep vein thrombosis (DVT) of popliteal vein of left lower extremity [facility])   [facility] Jerrol Banana., MD   1 year ago Lower extremity edema   [facility] Jerrol Banana., MD       Future Appointments             In 3 months Ralene Bathe, MD [place] BP in normal range    BP Readings from Last 1 Encounters:  05/02/19 110/60          Passed - Last Heart Rate in normal range    Pulse Readings from Last 1 Encounters:  05/02/19 75

## 2020-01-31 DIAGNOSIS — H2511 Age-related nuclear cataract, right eye: Secondary | ICD-10-CM | POA: Diagnosis not present

## 2020-02-01 ENCOUNTER — Encounter: Payer: Self-pay | Admitting: Ophthalmology

## 2020-02-01 ENCOUNTER — Other Ambulatory Visit: Payer: Self-pay

## 2020-02-06 ENCOUNTER — Other Ambulatory Visit: Payer: Self-pay | Admitting: Family Medicine

## 2020-02-06 MED ORDER — METOPROLOL SUCCINATE ER 25 MG PO TB24: 12.5000 mg | ORAL_TABLET | Freq: Every day | ORAL | 0 refills | Status: DC

## 2020-02-06 NOTE — Telephone Encounter (Signed)
Copied from [place] [id number]. Topic: Quick Communication - Rx Refill/Question >> Feb 06, 2020  2:32 PM Leward Quan A wrote: Medication: metoprolol succinate (TOPROL-XL) 25 MG 24 hr tablet Will be out in few days has an appointment in August   Has the patient contacted their pharmacy? Yes.   (Agent: If no, request that the patient contact the pharmacy for the refill.) (Agent: If yes, when and what did the pharmacy advise?)  Preferred Pharmacy (with phone number or street name): [id number] - [place], [place]  Phone:  [phone] Fax:  [phone]     Agent: Please be advised that RX refills may take up to 3 business days. We ask that you follow-up with your pharmacy.

## 2020-02-06 NOTE — Telephone Encounter (Signed)
Requested medication (s) are due for refill today: yes  Requested medication (s) are on the active medication list: yes  Last refill:  12/16/19 #15 tabs  Future visit scheduled: yes 05/02/20  Notes to clinic:  please review courtesy refill already given-pt has upcoming appt   Requested Prescriptions  Pending Prescriptions Disp Refills   metoprolol succinate (TOPROL-XL) 25 MG 24 hr tablet 15 tablet 0    Sig: Take 0.5 tablets (12.5 mg total) by mouth daily.      Cardiovascular:  Beta Blockers Failed - 02/06/2020  2:38 PM      Failed - Valid encounter within last 6 months    Recent Outpatient Visits           9 months ago Annual physical exam   [facility] Jerrol Banana., MD   1 year ago Acute deep vein thrombosis (DVT) of popliteal vein of left lower extremity [facility])   [facility] Jerrol Banana., MD   1 year ago Acute deep vein thrombosis (DVT) of popliteal vein of left lower extremity [facility])   [facility] Jerrol Banana., MD   1 year ago Acute deep vein thrombosis (DVT) of popliteal vein of left lower extremity [facility])   [facility] Jerrol Banana., MD   1 year ago Lower extremity edema   [facility] Jerrol Banana., MD       Future Appointments             In 2 months Ralene Bathe, MD [place] BP in normal range    BP Readings from Last 1 Encounters:  05/02/19 110/60          Passed - Last Heart Rate in normal range    Pulse Readings from Last 1 Encounters:  05/02/19 75

## 2020-02-09 ENCOUNTER — Other Ambulatory Visit: Payer: Self-pay

## 2020-02-09 ENCOUNTER — Other Ambulatory Visit
Admission: RE | Admit: 2020-02-09 | Discharge: 2020-02-09 | Disposition: A | Discharge status: Home / Self Care | Payer: Medicare HMO | Source: Ambulatory Visit | Attending: Ophthalmology | Admitting: Ophthalmology

## 2020-02-09 DIAGNOSIS — Z20822 Contact with and (suspected) exposure to covid-19: Secondary | ICD-10-CM | POA: Insufficient documentation

## 2020-02-09 DIAGNOSIS — Z01812 Encounter for preprocedural laboratory examination: Secondary | ICD-10-CM | POA: Diagnosis not present

## 2020-02-09 LAB — SARS CORONAVIRUS 2 (TAT 6-24 HRS): SARS Coronavirus 2: NEGATIVE

## 2020-02-09 NOTE — Discharge Instructions (Signed)
General Anesthesia, Adult, Care After This sheet gives you information about how to care for yourself after your procedure. Your health care provider may also give you more specific instructions. If you have problems or questions, contact your health care provider. What can I expect after the procedure? After the procedure, the following side effects are common:  Pain or discomfort at the IV site.  Nausea.  Vomiting.  Sore throat.  Trouble concentrating.  Feeling cold or chills.  Weak or tired.  Sleepiness and fatigue.  Soreness and body aches. These side effects can affect parts of the body that were not involved in surgery. Follow these instructions at home:  For at least 24 hours after the procedure:  Have a responsible adult stay with you. It is important to have someone help care for you until you are awake and alert.  Rest as needed.  Do not: ? Participate in activities in which you could fall or become injured. ? Drive. ? Use heavy machinery. ? Drink alcohol. ? Take sleeping pills or medicines that cause drowsiness. ? Make important decisions or sign legal documents. ? Take care of children on your own. Eating and drinking  Follow any instructions from your health care provider about eating or drinking restrictions.  When you feel hungry, start by eating small amounts of foods that are soft and easy to digest (bland), such as toast. Gradually return to your regular diet.  Drink enough fluid to keep your urine pale yellow.  If you vomit, rehydrate by drinking water, juice, or clear broth. General instructions  If you have sleep apnea, surgery and certain medicines can increase your risk for breathing problems. Follow instructions from your health care provider about wearing your sleep device: ? Anytime you are sleeping, including during daytime naps. ? While taking prescription pain medicines, sleeping medicines, or medicines that make you drowsy.  Return to  your normal activities as told by your health care provider. Ask your health care provider what activities are safe for you.  Take over-the-counter and prescription medicines only as told by your health care provider.  If you smoke, do not smoke without supervision.  Keep all follow-up visits as told by your health care provider. This is important. Contact a health care provider if:  You have nausea or vomiting that does not get better with medicine.  You cannot eat or drink without vomiting.  You have pain that does not get better with medicine.  You are unable to pass urine.  You develop a skin rash.  You have a fever.  You have redness around your IV site that gets worse. Get help right away if:  You have difficulty breathing.  You have chest pain.  You have blood in your urine or stool, or you vomit blood. Summary  After the procedure, it is common to have a sore throat or nausea. It is also common to feel tired.  Have a responsible adult stay with you for the first 24 hours after general anesthesia. It is important to have someone help care for you until you are awake and alert.  When you feel hungry, start by eating small amounts of foods that are soft and easy to digest (bland), such as toast. Gradually return to your regular diet.  Drink enough fluid to keep your urine pale yellow.  Return to your normal activities as told by your health care provider. Ask your health care provider what activities are safe for you. This information is not   intended to replace advice given to you by your health care provider. Make sure you discuss any questions you have with your health care provider. Document Revised: 08/27/2017 Document Reviewed: 04/09/2017 Elsevier Patient Education  2020 Elsevier Inc.  Cataract Surgery, Care After This sheet gives you information about how to care for yourself after your procedure. Your health care provider may also give you more specific  instructions. If you have problems or questions, contact your health care provider. What can I expect after the procedure? After the procedure, it is common to have:  Itching.  Discomfort.  Fluid discharge.  Sensitivity to light and to touch.  Bruising in or around the eye.  Mild blurred vision. Follow these instructions at home: Eye care   Do not touch or rub your eyes.  Protect your eyes as told by your health care provider. You may be told to wear a protective eye shield or sunglasses.  Do not put a contact lens into the affected eye or eyes until your health care provider approves.  Keep the area around your eye clean and dry: ? Avoid swimming. ? Do not allow water to hit you directly in the face while showering. ? Keep soap and shampoo out of your eyes.  Check your eye every day for signs of infection. Watch for: ? Redness, swelling, or pain. ? Fluid, blood, or pus. ? Warmth. ? A bad smell. ? Vision that is getting worse. ? Sensitivity that is getting worse. Activity  Do not drive for 24 hours if you were given a sedative during your procedure.  Avoid strenuous activities, such as playing contact sports, for as long as told by your health care provider.  Do not drive or use heavy machinery until your health care provider approves.  Do not bend or lift heavy objects. Bending increases pressure in the eye. You can walk, climb stairs, and do light household chores.  Ask your health care provider when you can return to work. If you work in a dusty environment, you may be advised to wear protective eyewear for a period of time. General instructions  Take or apply over-the-counter and prescription medicines only as told by your health care provider. This includes eye drops.  Keep all follow-up visits as told by your health care provider. This is important. Contact a health care provider if:  You have increased bruising around your eye.  You have pain that is  not helped with medicine.  You have a fever.  You have redness, swelling, or pain in your eye.  You have fluid, blood, or pus coming from your incision.  Your vision gets worse.  Your sensitivity to light gets worse. Get help right away if:  You have sudden loss of vision.  You see flashes of light or spots (floaters).  You have severe eye pain.  You develop nausea or vomiting. Summary  After your procedure, it is common to have itching, discomfort, bruising, fluid discharge, or sensitivity to light.  Follow instructions from your health care provider about caring for your eye after the procedure.  Do not rub your eye after the procedure. You may need to wear eye protection or sunglasses. Do not wear contact lenses. Keep the area around your eye clean and dry.  Avoid activities that require a lot of effort. These include playing sports and lifting heavy objects.  Contact a health care provider if you have increased bruising, pain that does not go away, or a fever. Get help right   away if you suddenly lose your vision, see flashes of light or spots, or have severe pain in the eye. This information is not intended to replace advice given to you by your health care provider. Make sure you discuss any questions you have with your health care provider. Document Revised: 06/20/2019 Document Reviewed: 02/21/2018 Elsevier Patient Education  2020 Elsevier Inc.  

## 2020-02-13 ENCOUNTER — Encounter: Payer: Self-pay | Admitting: Ophthalmology

## 2020-02-13 ENCOUNTER — Ambulatory Visit: Payer: Medicare HMO | Admitting: Anesthesiology

## 2020-02-13 ENCOUNTER — Other Ambulatory Visit: Payer: Self-pay

## 2020-02-13 ENCOUNTER — Encounter
Admission: RE | Disposition: A | Discharge status: Home / Self Care | Payer: Self-pay | Source: Home / Self Care | Attending: Ophthalmology

## 2020-02-13 ENCOUNTER — Ambulatory Visit
Admission: RE | Admit: 2020-02-13 | Discharge: 2020-02-13 | Disposition: A | Discharge status: Home / Self Care | Payer: Medicare HMO | Attending: Ophthalmology | Admitting: Ophthalmology

## 2020-02-13 DIAGNOSIS — I10 Essential (primary) hypertension: Secondary | ICD-10-CM | POA: Diagnosis not present

## 2020-02-13 DIAGNOSIS — Z79899 Other long term (current) drug therapy: Secondary | ICD-10-CM | POA: Insufficient documentation

## 2020-02-13 DIAGNOSIS — H2511 Age-related nuclear cataract, right eye: Secondary | ICD-10-CM | POA: Insufficient documentation

## 2020-02-13 DIAGNOSIS — Z87891 Personal history of nicotine dependence: Secondary | ICD-10-CM | POA: Diagnosis not present

## 2020-02-13 DIAGNOSIS — H25811 Combined forms of age-related cataract, right eye: Secondary | ICD-10-CM | POA: Diagnosis not present

## 2020-02-13 HISTORY — PX: CATARACT EXTRACTION W/PHACO: SHX586

## 2020-02-13 HISTORY — DX: Other complications of anesthesia, initial encounter: T88.59XA

## 2020-02-13 HISTORY — DX: Presence of dental prosthetic device (complete) (partial): Z97.2

## 2020-02-13 HISTORY — DX: Carpal tunnel syndrome, bilateral upper limbs: G56.03

## 2020-02-13 SURGERY — PHACOEMULSIFICATION, CATARACT, WITH IOL INSERTION
Anesthesia: Monitor Anesthesia Care | Site: Eye | Laterality: Right

## 2020-02-13 MED ORDER — ARMC OPHTHALMIC DILATING DROPS
1.0000 "application " | OPHTHALMIC | Status: DC | PRN
  Administered 2020-02-13 (×3): 1 via OPHTHALMIC

## 2020-02-13 MED ORDER — TETRACAINE HCL 0.5 % OP SOLN
1.0000 [drp] | OPHTHALMIC | Status: DC | PRN
  Administered 2020-02-13 (×3): 1 [drp] via OPHTHALMIC

## 2020-02-13 MED ORDER — FENTANYL CITRATE (PF) 100 MCG/2ML IJ SOLN
INTRAMUSCULAR | Status: DC | PRN
  Administered 2020-02-13: 50 ug via INTRAVENOUS

## 2020-02-13 MED ORDER — LIDOCAINE HCL (PF) 2 % IJ SOLN
INTRAOCULAR | Status: DC | PRN
  Administered 2020-02-13: 1 mL

## 2020-02-13 MED ORDER — MIDAZOLAM HCL 2 MG/2ML IJ SOLN
INTRAMUSCULAR | Status: DC | PRN
  Administered 2020-02-13: 1 mg via INTRAVENOUS

## 2020-02-13 MED ORDER — EPINEPHRINE PF 1 MG/ML IJ SOLN
INTRAOCULAR | Status: DC | PRN
  Administered 2020-02-13: 42 mL via OPHTHALMIC

## 2020-02-13 MED ORDER — BRIMONIDINE TARTRATE-TIMOLOL 0.2-0.5 % OP SOLN
OPHTHALMIC | Status: DC | PRN
  Administered 2020-02-13: 1 [drp] via OPHTHALMIC

## 2020-02-13 MED ORDER — NA CHONDROIT SULF-NA HYALURON 40-17 MG/ML IO SOLN
INTRAOCULAR | Status: DC | PRN
  Administered 2020-02-13: 1 mL via INTRAOCULAR

## 2020-02-13 MED ORDER — MOXIFLOXACIN HCL 0.5 % OP SOLN
OPHTHALMIC | Status: DC | PRN
  Administered 2020-02-13: 0.2 mL via OPHTHALMIC

## 2020-02-13 SURGICAL SUPPLY — 20 items
CANNULA ANT/CHMB 27G (MISCELLANEOUS) ×2 IMPLANT
CANNULA ANT/CHMB 27GA (MISCELLANEOUS) ×4 IMPLANT
GLOVE SURG LX 8.0 MICRO (GLOVE) ×1
GLOVE SURG LX STRL 8.0 MICRO (GLOVE) ×1 IMPLANT
GLOVE SURG TRIUMPH 8.0 PF LTX (GLOVE) ×2 IMPLANT
GOWN STRL REUS W/ TWL LRG LVL3 (GOWN DISPOSABLE) ×2 IMPLANT
GOWN STRL REUS W/TWL LRG LVL3 (GOWN DISPOSABLE) ×4
LENS IOL DIOP 20.5 (Intraocular Lens) ×2 IMPLANT
LENS IOL TECNIS MONO 20.5 (Intraocular Lens) IMPLANT
MARKER SKIN DUAL TIP RULER LAB (MISCELLANEOUS) ×2 IMPLANT
NDL FILTER BLUNT 18X1 1/2 (NEEDLE) ×1 IMPLANT
NEEDLE FILTER BLUNT 18X 1/2SAF (NEEDLE) ×1
NEEDLE FILTER BLUNT 18X1 1/2 (NEEDLE) ×1 IMPLANT
PACK EYE AFTER SURG (MISCELLANEOUS) ×2 IMPLANT
PACK OPTHALMIC (MISCELLANEOUS) ×2 IMPLANT
PACK PORFILIO (MISCELLANEOUS) ×2 IMPLANT
SYR 3ML LL SCALE MARK (SYRINGE) ×2 IMPLANT
SYR TB 1ML LUER SLIP (SYRINGE) ×2 IMPLANT
WATER STERILE IRR 250ML POUR (IV SOLUTION) ×2 IMPLANT
WIPE NON LINTING 3.25X3.25 (MISCELLANEOUS) ×2 IMPLANT

## 2020-02-13 NOTE — Anesthesia Preprocedure Evaluation (Signed)
Anesthesia Evaluation  Patient identified by MRN, date of birth, ID band Patient awake    History of Anesthesia Complications Negative for: history of anesthetic complications  Airway Mallampati: III  TM Distance: >3 FB Neck ROM: Full    Dental no notable dental hx.    Pulmonary former smoker,    Pulmonary exam normal        Cardiovascular Exercise Tolerance: Good hypertension, Normal cardiovascular exam     Neuro/Psych negative psych ROS   GI/Hepatic negative GI ROS, Neg liver ROS,   Endo/Other  negative endocrine ROS  Renal/GU negative Renal ROS     Musculoskeletal   Abdominal   Peds  Hematology negative hematology ROS (+)   Anesthesia Other Findings   Reproductive/Obstetrics                             Anesthesia Physical Anesthesia Plan  ASA: II  Anesthesia Plan: MAC   Post-op Pain Management:    Induction: Intravenous  PONV Risk Score and Plan: 1 and Midazolam, Treatment may vary due to age or medical condition and TIVA  Airway Management Planned: Nasal Cannula and Natural Airway  Additional Equipment: None  Intra-op Plan:   Post-operative Plan:   Informed Consent: I have reviewed the patients History and Physical, chart, labs and discussed the procedure including the risks, benefits and alternatives for the proposed anesthesia with the patient or authorized representative who has indicated his/her understanding and acceptance.       Plan Discussed with: CRNA  Anesthesia Plan Comments:         Anesthesia Quick Evaluation

## 2020-02-13 NOTE — Anesthesia Procedure Notes (Signed)
Procedure Name: MAC Date/Time: 02/13/2020 12:33 PM Performed by: Cameron Ali, CRNA Pre-anesthesia Checklist: Patient identified, Emergency Drugs available, Suction available, Timeout performed and Patient being monitored Patient Re-evaluated:Patient Re-evaluated prior to induction Oxygen Delivery Method: Nasal cannula Placement Confirmation: positive ETCO2

## 2020-02-13 NOTE — Transfer of Care (Signed)
Immediate Anesthesia Transfer of Care Note  Patient: Stephen Ponce  Procedure(s) Performed: CATARACT EXTRACTION PHACO AND INTRAOCULAR LENS PLACEMENT (IOC) RIGHT 4.48  00:31.2 (Right Eye)  Patient Location: PACU  Anesthesia Type: MAC  Level of Consciousness: awake, alert  and patient cooperative  Airway and Oxygen Therapy: Patient Spontanous Breathing and Patient connected to supplemental oxygen  Post-op Assessment: Post-op Vital signs reviewed, Patient's Cardiovascular Status Stable, Respiratory Function Stable, Patent Airway and No signs of Nausea or vomiting  Post-op Vital Signs: Reviewed and stable  Complications: No apparent anesthesia complications

## 2020-02-13 NOTE — Anesthesia Postprocedure Evaluation (Signed)
Anesthesia Post Note  Patient: Stephen Ponce  Procedure(s) Performed: CATARACT EXTRACTION PHACO AND INTRAOCULAR LENS PLACEMENT (IOC) RIGHT 4.48  00:31.2 (Right Eye)     Patient location during evaluation: PACU Anesthesia Type: MAC Level of consciousness: awake and alert Pain management: pain level controlled Vital Signs Assessment: post-procedure vital signs reviewed and stable Respiratory status: spontaneous breathing Cardiovascular status: blood pressure returned to baseline Postop Assessment: no apparent nausea or vomiting, adequate PO intake and no headache Anesthetic complications: no    Adele Barthel Deema Juncaj

## 2020-02-13 NOTE — Op Note (Signed)
PREOPERATIVE DIAGNOSIS:  Nuclear sclerotic cataract of the right eye.   POSTOPERATIVE DIAGNOSIS:  H25.11 Cataract   OPERATIVE PROCEDURE:@   SURGEON:  Birder Robson, MD.   ANESTHESIA:  Anesthesiologist: Page, Adele Barthel, MD CRNA: Cameron Ali, CRNA  1.      Managed anesthesia care. 2.      0.71ml of Shugarcaine was instilled in the eye following the paracentesis.   COMPLICATIONS:  None.   TECHNIQUE:   Stop and chop   DESCRIPTION OF PROCEDURE:  The patient was examined and consented in the preoperative holding area where the aforementioned topical anesthesia was applied to the right eye and then brought back to the Operating Room where the right eye was prepped and draped in the usual sterile ophthalmic fashion and a lid speculum was placed. A paracentesis was created with the side port blade and the anterior chamber was filled with viscoelastic. A near clear corneal incision was performed with the steel keratome. A continuous curvilinear capsulorrhexis was performed with a cystotome followed by the capsulorrhexis forceps. Hydrodissection and hydrodelineation were carried out with BSS on a blunt cannula. The lens was removed in a stop and chop  technique and the remaining cortical material was removed with the irrigation-aspiration handpiece. The capsular bag was inflated with viscoelastic and the Technis [id number]  lens was placed in the capsular bag without complication. The remaining viscoelastic was removed from the eye with the irrigation-aspiration handpiece. The wounds were hydrated. The anterior chamber was flushed with BSS and the eye was inflated to physiologic pressure. 0.36ml of Vigamox was placed in the anterior chamber. The wounds were found to be water tight. The eye was dressed with Combigan. The patient was given protective glasses to wear throughout the day and a shield with which to sleep tonight. The patient was also given drops with which to begin a drop regimen today and will  follow-up with me in one day. Implant Name Type Inv. Item Serial No. Manufacturer Lot No. LRB No. Used Action  LENS IOL DIOP 20.5 - [id number] Intraocular Lens LENS IOL DIOP 20.5 [id number] AMO  Right 1 Implanted   Procedure(s): CATARACT EXTRACTION PHACO AND INTRAOCULAR LENS PLACEMENT (IOC) RIGHT 4.48  00:31.2 (Right)  Electronically signed: Birder Robson 02/13/2020 12:51 PM

## 2020-02-13 NOTE — H&P (Signed)
All labs reviewed. Abnormal studies sent to patients PCP when indicated.  Previous H&P reviewed, patient examined, there are NO CHANGES.  Stephen Heidler Porfilio6/8/202112:25 PM

## 2020-02-14 ENCOUNTER — Encounter: Payer: Self-pay | Admitting: *Deleted

## 2020-02-26 ENCOUNTER — Telehealth: Payer: Self-pay | Admitting: Family Medicine

## 2020-02-26 NOTE — Telephone Encounter (Signed)
Called patient to schedule Medicare Annual Wellness Visit with Nurse Health Advisor.   If patient returns call, please schedule for any date.  Last AWV completed: 06/08/2019  Appointment length should be 40 minutes  Thank you,  Colletta [place] [phone]

## 2020-03-05 NOTE — Telephone Encounter (Signed)
Pt is in [place] at the moment. Will call back when he gets back in town and can check his schedule.

## 2020-03-19 DIAGNOSIS — H2512 Age-related nuclear cataract, left eye: Secondary | ICD-10-CM | POA: Diagnosis not present

## 2020-03-19 DIAGNOSIS — I10 Essential (primary) hypertension: Secondary | ICD-10-CM | POA: Diagnosis not present

## 2020-03-21 ENCOUNTER — Encounter: Payer: Self-pay | Admitting: Ophthalmology

## 2020-03-28 ENCOUNTER — Other Ambulatory Visit: Admission: RE | Admit: 2020-03-28 | Payer: Medicare HMO | Source: Ambulatory Visit

## 2020-03-28 NOTE — Discharge Instructions (Signed)

## 2020-03-31 ENCOUNTER — Other Ambulatory Visit: Payer: Self-pay | Admitting: Family Medicine

## 2020-03-31 NOTE — Telephone Encounter (Signed)
Pt given enough RF to last until upcoming appt Requested Prescriptions  Pending Prescriptions Disp Refills  . metoprolol succinate (TOPROL-XL) 25 MG 24 hr tablet [Pharmacy Med Name: METOPROLOL SUCC ER 25 MG TAB] 45 tablet 0    Sig: TAKE 1/2 TABLET BY MOUTH EVERY DAY     Cardiovascular:  Beta Blockers Failed - 03/31/2020 12:30 PM      Failed - Valid encounter within last 6 months    Recent Outpatient Visits          11 months ago Annual physical exam   [facility] Jerrol Banana., MD   1 year ago Acute deep vein thrombosis (DVT) of popliteal vein of left lower extremity [facility])   [facility] Jerrol Banana., MD   1 year ago Acute deep vein thrombosis (DVT) of popliteal vein of left lower extremity [facility])   [facility] Jerrol Banana., MD   1 year ago Acute deep vein thrombosis (DVT) of popliteal vein of left lower extremity [facility])   [facility] Jerrol Banana., MD   1 year ago Lower extremity edema   [facility] Jerrol Banana., MD      Future Appointments            In 2 weeks Ralene Bathe, MD [place]   In 1 month Jerrol Banana., MD [facility]           Passed - Last BP in normal range    BP Readings from Last 1 Encounters:  02/13/20 123/84         Passed - Last Heart Rate in normal range    Pulse Readings from Last 1 Encounters:  02/13/20 68

## 2020-04-01 ENCOUNTER — Ambulatory Visit: Payer: Medicare HMO | Admitting: Anesthesiology

## 2020-04-01 ENCOUNTER — Encounter
Admission: RE | Disposition: A | Discharge status: Home / Self Care | Payer: Self-pay | Source: Home / Self Care | Attending: Ophthalmology

## 2020-04-01 ENCOUNTER — Other Ambulatory Visit: Payer: Self-pay

## 2020-04-01 ENCOUNTER — Encounter: Payer: Self-pay | Admitting: Ophthalmology

## 2020-04-01 ENCOUNTER — Ambulatory Visit
Admission: RE | Admit: 2020-04-01 | Discharge: 2020-04-01 | Disposition: A | Discharge status: Home / Self Care | Payer: Medicare HMO | Attending: Ophthalmology | Admitting: Ophthalmology

## 2020-04-01 DIAGNOSIS — I10 Essential (primary) hypertension: Secondary | ICD-10-CM | POA: Insufficient documentation

## 2020-04-01 DIAGNOSIS — H25812 Combined forms of age-related cataract, left eye: Secondary | ICD-10-CM | POA: Diagnosis not present

## 2020-04-01 DIAGNOSIS — Z87891 Personal history of nicotine dependence: Secondary | ICD-10-CM | POA: Insufficient documentation

## 2020-04-01 DIAGNOSIS — Z85828 Personal history of other malignant neoplasm of skin: Secondary | ICD-10-CM | POA: Diagnosis not present

## 2020-04-01 DIAGNOSIS — M1990 Unspecified osteoarthritis, unspecified site: Secondary | ICD-10-CM | POA: Diagnosis not present

## 2020-04-01 DIAGNOSIS — H2512 Age-related nuclear cataract, left eye: Secondary | ICD-10-CM | POA: Insufficient documentation

## 2020-04-01 DIAGNOSIS — N529 Male erectile dysfunction, unspecified: Secondary | ICD-10-CM | POA: Diagnosis not present

## 2020-04-01 DIAGNOSIS — Z79899 Other long term (current) drug therapy: Secondary | ICD-10-CM | POA: Diagnosis not present

## 2020-04-01 DIAGNOSIS — Z86718 Personal history of other venous thrombosis and embolism: Secondary | ICD-10-CM | POA: Insufficient documentation

## 2020-04-01 HISTORY — PX: CATARACT EXTRACTION W/PHACO: SHX586

## 2020-04-01 SURGERY — PHACOEMULSIFICATION, CATARACT, WITH IOL INSERTION
Anesthesia: Monitor Anesthesia Care | Site: Eye | Laterality: Left

## 2020-04-01 MED ORDER — NA CHONDROIT SULF-NA HYALURON 40-17 MG/ML IO SOLN
INTRAOCULAR | Status: DC | PRN
  Administered 2020-04-01: 1 mL via INTRAOCULAR

## 2020-04-01 MED ORDER — LIDOCAINE HCL (PF) 2 % IJ SOLN
INTRAOCULAR | Status: DC | PRN
  Administered 2020-04-01: 2 mL

## 2020-04-01 MED ORDER — ARMC OPHTHALMIC DILATING DROPS
1.0000 "application " | OPHTHALMIC | Status: DC | PRN
  Administered 2020-04-01 (×3): 1 via OPHTHALMIC

## 2020-04-01 MED ORDER — FENTANYL CITRATE (PF) 100 MCG/2ML IJ SOLN
INTRAMUSCULAR | Status: DC | PRN
  Administered 2020-04-01: 50 ug via INTRAVENOUS

## 2020-04-01 MED ORDER — MOXIFLOXACIN HCL 0.5 % OP SOLN
OPHTHALMIC | Status: DC | PRN
  Administered 2020-04-01: 0.2 mL via OPHTHALMIC

## 2020-04-01 MED ORDER — BRIMONIDINE TARTRATE-TIMOLOL 0.2-0.5 % OP SOLN
OPHTHALMIC | Status: DC | PRN
  Administered 2020-04-01: 1 [drp] via OPHTHALMIC

## 2020-04-01 MED ORDER — MIDAZOLAM HCL 2 MG/2ML IJ SOLN
INTRAMUSCULAR | Status: DC | PRN
  Administered 2020-04-01: 1 mg via INTRAVENOUS

## 2020-04-01 MED ORDER — ACETAMINOPHEN 325 MG PO TABS: 325.0000 mg | ORAL_TABLET | ORAL | Status: DC | PRN

## 2020-04-01 MED ORDER — EPINEPHRINE PF 1 MG/ML IJ SOLN
INTRAOCULAR | Status: DC | PRN
  Administered 2020-04-01: 49 mL via OPHTHALMIC

## 2020-04-01 MED ORDER — TETRACAINE HCL 0.5 % OP SOLN
1.0000 [drp] | OPHTHALMIC | Status: DC | PRN
  Administered 2020-04-01 (×3): 1 [drp] via OPHTHALMIC

## 2020-04-01 MED ORDER — ACETAMINOPHEN 160 MG/5ML PO SOLN: 325.0000 mg | ORAL | Status: DC | PRN

## 2020-04-01 MED ORDER — ONDANSETRON HCL 4 MG/2ML IJ SOLN: 4.0000 mg | Freq: Once | INTRAMUSCULAR | Status: DC | PRN

## 2020-04-01 SURGICAL SUPPLY — 20 items
CANNULA ANT/CHMB 27G (MISCELLANEOUS) ×2 IMPLANT
CANNULA ANT/CHMB 27GA (MISCELLANEOUS) ×4 IMPLANT
GLOVE SURG LX 8.0 MICRO (GLOVE) ×1
GLOVE SURG LX STRL 8.0 MICRO (GLOVE) ×1 IMPLANT
GLOVE SURG TRIUMPH 8.0 PF LTX (GLOVE) ×2 IMPLANT
GOWN STRL REUS W/ TWL LRG LVL3 (GOWN DISPOSABLE) ×2 IMPLANT
GOWN STRL REUS W/TWL LRG LVL3 (GOWN DISPOSABLE) ×4
LENS IOL DIOP 21.0 (Intraocular Lens) ×2 IMPLANT
LENS IOL TECNIS MONO 21.0 (Intraocular Lens) IMPLANT
MARKER SKIN DUAL TIP RULER LAB (MISCELLANEOUS) ×2 IMPLANT
NDL FILTER BLUNT 18X1 1/2 (NEEDLE) ×1 IMPLANT
NEEDLE FILTER BLUNT 18X 1/2SAF (NEEDLE) ×1
NEEDLE FILTER BLUNT 18X1 1/2 (NEEDLE) ×1 IMPLANT
PACK EYE AFTER SURG (MISCELLANEOUS) ×2 IMPLANT
PACK OPTHALMIC (MISCELLANEOUS) ×2 IMPLANT
PACK PORFILIO (MISCELLANEOUS) ×2 IMPLANT
SYR 3ML LL SCALE MARK (SYRINGE) ×2 IMPLANT
SYR TB 1ML LUER SLIP (SYRINGE) ×2 IMPLANT
WATER STERILE IRR 250ML POUR (IV SOLUTION) ×2 IMPLANT
WIPE NON LINTING 3.25X3.25 (MISCELLANEOUS) ×2 IMPLANT

## 2020-04-01 NOTE — Anesthesia Postprocedure Evaluation (Signed)
Anesthesia Post Note  Patient: Stephen Ponce  Procedure(s) Performed: CATARACT EXTRACTION PHACO AND INTRAOCULAR LENS PLACEMENT (IOC) LEFT 6.87 00:38.9  (Left Eye)     Patient location during evaluation: PACU Anesthesia Type: MAC Level of consciousness: awake Pain management: pain level controlled Vital Signs Assessment: post-procedure vital signs reviewed and stable Respiratory status: respiratory function stable Cardiovascular status: stable Postop Assessment: no apparent nausea or vomiting Anesthetic complications: no   No complications documented.  Veda Canning

## 2020-04-01 NOTE — H&P (Signed)
All labs reviewed. Abnormal studies sent to patients PCP when indicated.  Previous H&P reviewed, patient examined, there are NO CHANGES.  Stephen Ponce Porfilio7/26/20218:14 AM

## 2020-04-01 NOTE — Anesthesia Procedure Notes (Signed)
Procedure Name: MAC Date/Time: 04/01/2020 8:20 AM Performed by: Cameron Ali, CRNA Pre-anesthesia Checklist: Patient identified, Emergency Drugs available, Suction available, Timeout performed and Patient being monitored Patient Re-evaluated:Patient Re-evaluated prior to induction Oxygen Delivery Method: Nasal cannula Placement Confirmation: positive ETCO2

## 2020-04-01 NOTE — Transfer of Care (Signed)
Immediate Anesthesia Transfer of Care Note  Patient: Stephen Ponce  Procedure(s) Performed: CATARACT EXTRACTION PHACO AND INTRAOCULAR LENS PLACEMENT (IOC) LEFT 6.87 00:38.9  (Left Eye)  Patient Location: PACU  Anesthesia Type: MAC  Level of Consciousness: awake, alert  and patient cooperative  Airway and Oxygen Therapy: Patient Spontanous Breathing and Patient connected to supplemental oxygen  Post-op Assessment: Post-op Vital signs reviewed, Patient's Cardiovascular Status Stable, Respiratory Function Stable, Patent Airway and No signs of Nausea or vomiting  Post-op Vital Signs: Reviewed and stable  Complications: No complications documented.

## 2020-04-01 NOTE — Anesthesia Preprocedure Evaluation (Signed)
Anesthesia Evaluation  Patient identified by MRN, date of birth, ID band Patient awake    Reviewed: Allergy & Precautions, NPO status   History of Anesthesia Complications Negative for: history of anesthetic complications  Airway Mallampati: III  TM Distance: >3 FB Neck ROM: Full    Dental no notable dental hx.    Pulmonary former smoker,    Pulmonary exam normal        Cardiovascular Exercise Tolerance: Good hypertension,  Rhythm:Regular Rate:Normal     Neuro/Psych    GI/Hepatic negative GI ROS,   Endo/Other    Renal/GU      Musculoskeletal  (+) Arthritis ,   Abdominal   Peds  Hematology   Anesthesia Other Findings   Reproductive/Obstetrics                             Anesthesia Physical  Anesthesia Plan  ASA: II  Anesthesia Plan: MAC   Post-op Pain Management:    Induction: Intravenous  PONV Risk Score and Plan: 1 and Midazolam, Treatment may vary due to age or medical condition and TIVA  Airway Management Planned: Nasal Cannula and Natural Airway  Additional Equipment: None  Intra-op Plan:   Post-operative Plan:   Informed Consent: I have reviewed the patients History and Physical, chart, labs and discussed the procedure including the risks, benefits and alternatives for the proposed anesthesia with the patient or authorized representative who has indicated his/her understanding and acceptance.       Plan Discussed with: CRNA  Anesthesia Plan Comments:         Anesthesia Quick Evaluation

## 2020-04-01 NOTE — Op Note (Signed)
PREOPERATIVE DIAGNOSIS:  Nuclear sclerotic cataract of the left eye.   POSTOPERATIVE DIAGNOSIS:  Nuclear sclerotic cataract of the left eye.   OPERATIVE PROCEDURE:@   SURGEON:  Birder Robson, MD.   ANESTHESIA:  Anesthesiologist: Veda Canning, MD CRNA: Cameron Ali, CRNA  1.      Managed anesthesia care. 2.     0.22ml of Shugarcaine was instilled following the paracentesis   COMPLICATIONS:  None.   TECHNIQUE:   Stop and chop   DESCRIPTION OF PROCEDURE:  The patient was examined and consented in the preoperative holding area where the aforementioned topical anesthesia was applied to the left eye and then brought back to the Operating Room where the left eye was prepped and draped in the usual sterile ophthalmic fashion and a lid speculum was placed. A paracentesis was created with the side port blade and the anterior chamber was filled with viscoelastic. A near clear corneal incision was performed with the steel keratome. A continuous curvilinear capsulorrhexis was performed with a cystotome followed by the capsulorrhexis forceps. Hydrodissection and hydrodelineation were carried out with BSS on a blunt cannula. The lens was removed in a stop and chop  technique and the remaining cortical material was removed with the irrigation-aspiration handpiece. The capsular bag was inflated with viscoelastic and the Technis [id number] lens was placed in the capsular bag without complication. The remaining viscoelastic was removed from the eye with the irrigation-aspiration handpiece. The wounds were hydrated. The anterior chamber was flushed with BSS and the eye was inflated to physiologic pressure. 0.7ml Vigamox was placed in the anterior chamber. The wounds were found to be water tight. The eye was dressed with Combigan. The patient was given protective glasses to wear throughout the day and a shield with which to sleep tonight. The patient was also given drops with which to begin a drop regimen today and  will follow-up with me in one day. Implant Name Type Inv. Item Serial No. Manufacturer Lot No. LRB No. Used Action  LENS IOL DIOP 21.0 - [id number] Intraocular Lens LENS IOL DIOP 21.0 [id number] [employer]  Left 1 Implanted    Procedure(s): CATARACT EXTRACTION PHACO AND INTRAOCULAR LENS PLACEMENT (IOC) LEFT 6.87 00:38.9  (Left)  Electronically signed: Birder Robson 04/01/2020 8:36 AM

## 2020-04-01 NOTE — H&P (Signed)
All labs reviewed. Abnormal studies sent to patients PCP when indicated.  Previous H&P reviewed, patient examined, there are NO CHANGES.  [name]7/26/20217:21 AM

## 2020-04-02 ENCOUNTER — Encounter: Payer: Self-pay | Admitting: Ophthalmology

## 2020-04-15 ENCOUNTER — Ambulatory Visit: Payer: Self-pay | Admitting: Dermatology

## 2020-05-02 ENCOUNTER — Encounter: Payer: Medicare HMO | Admitting: Family Medicine

## 2020-05-07 NOTE — Progress Notes (Addendum)
Subjective:   Stephen Ponce is a 74 y.o. male who presents for an Initial Medicare Annual Wellness Visit.  Review of Systems    N/A  Cardiac Risk Factors include: advanced age (>36men, >65 women);male gender;obesity (BMI >30kg/m2)     Objective:    Today's Vitals   05/08/20 0944 05/08/20 1016  BP: (!) 152/82 (!) 162/68  Pulse: (!) 59   Temp: 98.6 F (37 C)   TempSrc: Oral   SpO2: 95%   Weight: 238 lb 12.8 oz (108.3 kg)   Height: 5\' 9"  (1.753 m)   PainSc: 0-No pain    Body mass index is 35.26 kg/m.  Advanced Directives 05/08/2020 04/01/2020 02/13/2020 04/10/2019 04/07/2019 07/18/2018 07/13/2018  Does Patient Have a Medical Advance Directive? Yes Yes Yes No No Yes Yes  Type of Paramedic of [place];Living will Living will Living will - - Living will -  Does patient want to make changes to medical advance directive? - No - Patient declined - - - No - Patient declined No - Patient declined  Copy of [place] in Chart? No - copy requested - No - copy requested - - - -  Would patient like information on creating a medical advance directive? - - - No - Patient declined - - -    Current Medications (verified) Outpatient Encounter Medications as of 05/08/2020  Medication Sig   Ascorbic Acid (VITAMIN C) 1000 MG tablet Take 1,000 mg by mouth daily.    Cholecalciferol (VITAMIN D3) 1000 units CAPS Take 1,000 Units by mouth daily.   Cyanocobalamin (VITAMIN B 12 PO) Take 1 tablet by mouth at bedtime.    Glucos-Chond-Sterol-Fish Oil (GLUCOSAMINE CHONDROITIN PLUS) CAPS Take 1 tablet by mouth daily.    Krill Oil 350 MG CAPS Take 350 mg by mouth daily.    meloxicam (MOBIC) 15 MG tablet Take 1 tablet (15 mg total) by mouth daily.   metoprolol succinate (TOPROL-XL) 25 MG 24 hr tablet TAKE 1/2 TABLET BY MOUTH EVERY DAY   Multiple Vitamin tablet Take 1 tablet by mouth daily.    Pyridoxine HCl (VITAMIN B6) 200 MG TABS Take 200 mg by mouth daily.      Ubiquinol 100 MG CAPS Take 100 mg by mouth daily.    cyclobenzaprine (FLEXERIL) 5 MG tablet Take 1 tablet (5 mg total) by mouth 3 (three) times daily as needed for muscle spasms. (Patient not taking: Reported on 07/12/2018)   furosemide (LASIX) 20 MG tablet TAKE 1 TABLET BY MOUTH TWICE A DAY (Patient not taking: Reported on 10/26/2018)   HYDROcodone-acetaminophen (NORCO) 5-325 MG tablet Take 1-2 tablets by mouth every 6 (six) hours as needed. (Patient not taking: Reported on 05/02/2019)   No facility-administered encounter medications on file as of 05/08/2020.    Allergies (verified) Penicillins and Gabapentin   History: Past Medical History:  Diagnosis Date   Actinic keratosis    Arthritis    Basal cell carcinoma 10/14/2016   Left top of shoulder. 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 SCCis  Past Surgical History:  Procedure Laterality Date   CARPAL TUNNEL RELEASE Left 04/10/2019   Procedure: CARPAL TUNNEL RELEASE;  Surgeon: Earnestine Leys, MD;  Location: [facility];  Service: Orthopedics;  Laterality: Left;   CATARACT EXTRACTION W/PHACO Right 02/13/2020   Procedure: CATARACT EXTRACTION PHACO AND INTRAOCULAR LENS PLACEMENT (IOC) RIGHT 4.48  00:31.2;  Surgeon: Birder Robson, MD;  Location: [place];  Service: Ophthalmology;  Laterality: Right;   CATARACT EXTRACTION W/PHACO Left 04/01/2020   Procedure: CATARACT EXTRACTION PHACO AND INTRAOCULAR LENS PLACEMENT (IOC) LEFT 6.87 00:38.9 ;  Surgeon: Birder Robson, MD;  Location: [place];  Service: Ophthalmology;  Laterality: Left;   COLONOSCOPY   08/09/2014   tubular adenoma   OSTEOTOMY     SHOULDER ARTHROSCOPY WITH OPEN ROTATOR CUFF REPAIR Left 07/18/2018   Procedure: SHOULDER ARTHROSCOPY WITH OPEN ROTATOR CUFF REPAIR;  Surgeon: Earnestine Leys, MD;  Location: [facility];  Service: Orthopedics;  Laterality: Left;   Family History  Problem Relation Age of Onset   CAD Father    Stomach cancer Brother    Lung cancer Brother    Bone cancer Brother    Social History   Socioeconomic History   Marital status: Married    Spouse name: Not on file   Number of children: 2   Years of education: Not on file   Highest education level: High school graduate  Occupational History   Occupation: delivery driver  Tobacco Use   Smoking status: Former Smoker    Packs/day: 3.00    Years: 35.00    Pack years: 105.00    Types: Cigarettes    Quit date: 09/07/1996    Years since quitting: 23.6   Smokeless tobacco: Never Used  Vaping Use   Vaping Use: Never used  Substance and Sexual Activity   Alcohol use: Yes    Alcohol/week: 0.0 - 2.0 standard drinks    Comment: 1-2 beers a couple times a month   Drug use: No   Sexual activity: Not on file  Other Topics Concern   Not on file  Social History Narrative   Not on file   Social Determinants of Health   Financial Resource Strain: Low Risk    Difficulty of Paying Living Expenses: Not hard at all  Food Insecurity: No Food Insecurity   Worried About Charity fundraiser in the Last Year: Never true   [place] in the Last Year: Never true  Transportation Needs: No Transportation Needs   Lack of Transportation (Medical): No   Lack of Transportation (Non-Medical): No  Physical Activity: Inactive   Days of Exercise per Week: 0 days   Minutes of Exercise per Session: 0 min  Stress: No Stress Concern Present   Feeling of Stress : Only a little  Social Connections: Engineer, building services of Communication with Friends and Family: More than three times  a week   Frequency of Social Gatherings with Friends and Family: More than three times a week   Attends Religious Services: More than 4 times per year   Active Member of [employer] or Organizations: Yes   Attends Archivist Meetings: 1 to 4 times per year   Marital Status: Married    Tobacco Counseling Counseling given: Not Answered   Clinical Intake:  Pre-visit preparation completed: Yes  Pain : No/denies pain Pain Score: 0-No pain     Nutritional Status: BMI > 30  Obese Nutritional Risks: None Diabetes: No  How often do  you need to have someone help you when you read instructions, pamphlets, or other written materials from your doctor or pharmacy?: 1 - Never  Diabetic? No  Interpreter Needed?: No  Information entered by :: [facility], LPN   Activities of Daily Living In your present state of health, do you have any difficulty performing the following activities: 05/08/2020 04/01/2020  Hearing? Y N  Comment Trouble hearing out of left ear. Does not wear a hearing aid. -  Vision? N N  Difficulty concentrating or making decisions? N N  Walking or climbing stairs? Y N  Comment Due to knee pains. -  Dressing or bathing? N -  Doing errands, shopping? N -  Preparing Food and eating ? N -  Using the Toilet? N -  In the past six months, have you accidently leaked urine? N -  Do you have problems with loss of bowel control? N -  Managing your Medications? N -  Managing your Finances? N -  Housekeeping or managing your Housekeeping? N -  Some recent data might be hidden    Patient Care Team: Jerrol Banana., MD as PCP - General (Family Medicine) Birder Robson, MD as Referring Physician (Ophthalmology)  Indicate any recent Medical Services you may have received from other than [facility] providers in the past year (date may be approximate).     Assessment:   This is a routine wellness examination for Stephen Ponce.  Hearing/Vision screen No exam data  present  Dietary issues and exercise activities discussed: Current Exercise Habits: The patient does not participate in regular exercise at present, Exercise limited by: orthopedic condition(s)  Goals     LIFESTYLE - DECREASE FALLS RISK     Recommend to remove any items from the home that may cause slips or trips.      Depression Screen PHQ 2/9 Scores 05/08/2020 10/26/2018 08/24/2017 08/24/2016 08/22/2015  PHQ - 2 Score 0 0 0 0 0    Fall Risk Fall Risk  05/08/2020 10/26/2018 08/24/2017 08/24/2016 08/22/2015  Falls in the past year? 1 0 No No No  Number falls in past yr: 0 - - - -  Comment Tripped over tote and broke nose. - - - -  Injury with Fall? 1 - - - -  Follow up Falls prevention discussed - - - -    Any stairs in or around the home? Yes  If so, are there any without handrails? No  Home free of loose throw rugs in walkways, pet beds, electrical cords, etc? Yes  Adequate lighting in your home to reduce risk of falls? Yes   ASSISTIVE DEVICES UTILIZED TO PREVENT FALLS:  Life alert? No  Use of a cane, walker or w/c? No  Grab bars in the bathroom? No  Shower chair or bench in shower? Yes  Elevated toilet seat or a handicapped toilet? No   TIMED UP AND GO:  Was the test performed? Yes .  Length of time to ambulate 10 feet: 10 sec.   Gait slow and steady without use of assistive device  Cognitive Function: Declined today.         Immunizations Immunization History  Administered Date(s) Administered   Influenza, High Dose Seasonal PF 06/28/2018   PFIZER SARS-COV-2 Vaccination 10/30/2019, 11/20/2019   Pneumococcal Conjugate-13 06/28/2014   Pneumococcal Polysaccharide-23 08/24/2016   Td 05/11/2000   Tdap 12/07/2014   Zoster 12/07/2014    TDAP status: Up to date Flu Vaccine status: Due fall 2021 Pneumococcal vaccine status: Up  to date Covid-19 vaccine status: Completed vaccines  Qualifies for Shingles Vaccine? Yes   Zostavax completed Yes   Shingrix  Completed?: No.    Education has been provided regarding the importance of this vaccine. Patient has been advised to call insurance company to determine out of pocket expense if they have not yet received this vaccine. Advised may also receive vaccine at local pharmacy or Health Dept. Verbalized acceptance and understanding.  Screening Tests Health Maintenance  Topic Date Due   COLONOSCOPY  08/10/2019   INFLUENZA VACCINE  04/07/2020   TETANUS/TDAP  12/06/2024   COVID-19 Vaccine  Completed   Hepatitis C Screening  Completed   PNA vac Low Risk Adult  Completed    Health Maintenance  Health Maintenance Due  Topic Date Due   COLONOSCOPY  08/10/2019   INFLUENZA VACCINE  04/07/2020    Colorectal cancer screening: Referral to GI placed today. Pt aware the office will call re: appt.  Lung Cancer Screening: (Low Dose CT Chest recommended if Age 79-80 years, 30 pack-year currently smoking OR have quit w/in 15years.) does not qualify.    Additional Screening:  Hepatitis C Screening: Up to date  Vision Screening: Recommended annual ophthalmology exams for early detection of glaucoma and other disorders of the eye. Is the patient up to date with their annual eye exam?  Yes  Who is the provider or what is the name of the office in which the patient attends annual eye exams? Dr George Ina @ [facility] If pt is not established with a provider, would they like to be referred to a provider to establish care? No .   Dental Screening: Recommended annual dental exams for proper oral hygiene  Community Resource Referral / Chronic Care Management: CRR required this visit?  No   CCM required this visit?  No      Plan:     I have personally reviewed and noted the following in the patients chart:    Medical and social history  Use of alcohol, tobacco or illicit drugs   Current medications and supplements  Functional ability and status  Nutritional status  Physical  activity  Advanced directives  List of other physicians  Hospitalizations, surgeries, and ER visits in previous 12 months  Vitals  Screenings to include cognitive, depression, and falls  Referrals and appointments  In addition, I have reviewed and discussed with patient certain preventive protocols, quality metrics, and best practice recommendations. A written personalized care plan for preventive services as well as general preventive health recommendations were provided to patient.     Stephen Ponce [place], [place]   11/10/5730   Nurse Notes: Flu shot due this fall.

## 2020-05-08 ENCOUNTER — Other Ambulatory Visit: Payer: Self-pay

## 2020-05-08 ENCOUNTER — Ambulatory Visit (INDEPENDENT_AMBULATORY_CARE_PROVIDER_SITE_OTHER): Payer: Medicare HMO

## 2020-05-08 VITALS — BP 162/68 | HR 59 | Temp 98.6°F | Ht 69.0 in | Wt 238.8 lb

## 2020-05-08 DIAGNOSIS — Z1211 Encounter for screening for malignant neoplasm of colon: Secondary | ICD-10-CM

## 2020-05-08 DIAGNOSIS — Z0000 Encounter for general adult medical examination without abnormal findings: Secondary | ICD-10-CM | POA: Diagnosis not present

## 2020-05-08 NOTE — Patient Instructions (Signed)
[name] , Thank you for taking time to come for your Medicare Wellness Visit. I appreciate your ongoing commitment to your health goals. Please review the following plan we discussed and let me know if I can assist you in the future.   Screening recommendations/referrals: Colonoscopy: Referral to GI placed today. Pt aware the office will call re: appt. Recommended yearly ophthalmology/optometry visit for glaucoma screening and checkup Recommended yearly dental visit for hygiene and checkup  Vaccinations: Influenza vaccine: Due fall 2021 Pneumococcal vaccine: Completed series Tdap vaccine: Up to date, due 12/2024 Shingles vaccine: Shingrix discussed. Please contact your pharmacy for coverage information.     Advanced directives: Please bring a copy of your POA (Power of Attorney) and/or Living Will to your next appointment.   Conditions/risks identified: Fall risk preventatives discussed today.   Next appointment: 05/15/20 @ 10:00 AM with [name]   Preventive Care 45 Years and Older, Male Preventive care refers to lifestyle choices and visits with your health care provider that can promote health and wellness. What does preventive care include?  A yearly physical exam. This is also called an annual well check.  Dental exams once or twice a year.  Routine eye exams. Ask your health care provider how often you should have your eyes checked.  Personal lifestyle choices, including:  Daily care of your teeth and gums.  Regular physical activity.  Eating a healthy diet.  Avoiding tobacco and drug use.  Limiting alcohol use.  Practicing safe sex.  Taking low doses of aspirin every day.  Taking vitamin and mineral supplements as recommended by your health care provider. What happens during an annual well check? The services and screenings done by your health care provider during your annual well check will depend on your age, overall health, lifestyle risk factors, and  family history of disease. Counseling  Your health care provider may ask you questions about your:  Alcohol use.  Tobacco use.  Drug use.  Emotional well-being.  Home and relationship well-being.  Sexual activity.  Eating habits.  History of falls.  Memory and ability to understand (cognition).  Work and work Statistician. Screening  You may have the following tests or measurements:  Height, weight, and BMI.  Blood pressure.  Lipid and cholesterol levels. These may be checked every 5 years, or more frequently if you are over 14 years old.  Skin check.  Lung cancer screening. You may have this screening every year starting at age 58 if you have a 30-pack-year history of smoking and currently smoke or have quit within the past 15 years.  Fecal occult blood test (FOBT) of the stool. You may have this test every year starting at age 61.  Flexible sigmoidoscopy or colonoscopy. You may have a sigmoidoscopy every 5 years or a colonoscopy every 10 years starting at age 69.  Prostate cancer screening. Recommendations will vary depending on your family history and other risks.  Hepatitis C blood test.  Hepatitis B blood test.  Sexually transmitted disease (STD) testing.  Diabetes screening. This is done by checking your blood sugar (glucose) after you have not eaten for a while (fasting). You may have this done every 1-3 years.  Abdominal aortic aneurysm (AAA) screening. You may need this if you are a current or former smoker.  Osteoporosis. You may be screened starting at age 74 if you are at high risk. Talk with your health care provider about your test results, treatment options, and if necessary, the need for more  tests. Vaccines  Your health care provider may recommend certain vaccines, such as:  Influenza vaccine. This is recommended every year.  Tetanus, diphtheria, and acellular pertussis (Tdap, Td) vaccine. You may need a Td booster every 10 years.  Zoster  vaccine. You may need this after age 82.  Pneumococcal 13-valent conjugate (PCV13) vaccine. One dose is recommended after age 26.  Pneumococcal polysaccharide (PPSV23) vaccine. One dose is recommended after age 35. Talk to your health care provider about which screenings and vaccines you need and how often you need them. This information is not intended to replace advice given to you by your health care provider. Make sure you discuss any questions you have with your health care provider. Document Released: 09/20/2015 Document Revised: 05/13/2016 Document Reviewed: 06/25/2015 Elsevier Interactive Patient Education  2017 [place] Prevention in the Home Falls can cause injuries. They can happen to people of all ages. There are many things you can do to make your home safe and to help prevent falls. What can I do on the outside of my home?  Regularly fix the edges of walkways and driveways and fix any cracks.  Remove anything that might make you trip as you walk through a door, such as a raised step or threshold.  Trim any bushes or trees on the path to your home.  Use bright outdoor lighting.  Clear any walking paths of anything that might make someone trip, such as rocks or tools.  Regularly check to see if handrails are loose or broken. Make sure that both sides of any steps have handrails.  Any raised decks and porches should have guardrails on the edges.  Have any leaves, snow, or ice cleared regularly.  Use sand or salt on walking paths during winter.  Clean up any spills in your garage right away. This includes oil or grease spills. What can I do in the bathroom?  Use night lights.  Install grab bars by the toilet and in the tub and shower. Do not use towel bars as grab bars.  Use non-skid mats or decals in the tub or shower.  If you need to sit down in the shower, use a plastic, non-slip stool.  Keep the floor dry. Clean up any water that spills on the  floor as soon as it happens.  Remove soap buildup in the tub or shower regularly.  Attach bath mats securely with double-sided non-slip rug tape.  Do not have throw rugs and other things on the floor that can make you trip. What can I do in the bedroom?  Use night lights.  Make sure that you have a light by your bed that is easy to reach.  Do not use any sheets or blankets that are too big for your bed. They should not hang down onto the floor.  Have a firm chair that has side arms. You can use this for support while you get dressed.  Do not have throw rugs and other things on the floor that can make you trip. What can I do in the kitchen?  Clean up any spills right away.  Avoid walking on wet floors.  Keep items that you use a lot in easy-to-reach places.  If you need to reach something above you, use a strong step stool that has a grab bar.  Keep electrical cords out of the way.  Do not use floor polish or wax that makes floors slippery. If you must use wax, use non-skid floor  wax.  Do not have throw rugs and other things on the floor that can make you trip. What can I do with my stairs?  Do not leave any items on the stairs.  Make sure that there are handrails on both sides of the stairs and use them. Fix handrails that are broken or loose. Make sure that handrails are as long as the stairways.  Check any carpeting to make sure that it is firmly attached to the stairs. Fix any carpet that is loose or worn.  Avoid having throw rugs at the top or bottom of the stairs. If you do have throw rugs, attach them to the floor with carpet tape.  Make sure that you have a light switch at the top of the stairs and the bottom of the stairs. If you do not have them, ask someone to add them for you. What else can I do to help prevent falls?  Wear shoes that:  Do not have high heels.  Have rubber bottoms.  Are comfortable and fit you well.  Are closed at the toe. Do not wear  sandals.  If you use a stepladder:  Make sure that it is fully opened. Do not climb a closed stepladder.  Make sure that both sides of the stepladder are locked into place.  Ask someone to hold it for you, if possible.  Clearly mark and make sure that you can see:  Any grab bars or handrails.  First and last steps.  Where the edge of each step is.  Use tools that help you move around (mobility aids) if they are needed. These include:  Canes.  Walkers.  Scooters.  Crutches.  Turn on the lights when you go into a dark area. Replace any light bulbs as soon as they burn out.  Set up your furniture so you have a clear path. Avoid moving your furniture around.  If any of your floors are uneven, fix them.  If there are any pets around you, be aware of where they are.  Review your medicines with your doctor. Some medicines can make you feel dizzy. This can increase your chance of falling. Ask your doctor what other things that you can do to help prevent falls. This information is not intended to replace advice given to you by your health care provider. Make sure you discuss any questions you have with your health care provider. Document Released: 06/20/2009 Document Revised: 01/30/2016 Document Reviewed: 09/28/2014 Elsevier Interactive Patient Education  2017 [employer].

## 2020-05-10 NOTE — Progress Notes (Deleted)
Annual Wellness Visit     Patient: Stephen Ponce, Male    DOB: 1946-04-09, 74 y.o.   MRN: [id number] Visit Date: 05/15/2020  Today's Provider: Wilhemena Durie, MD   No chief complaint on file.  Subjective    Stephen Ponce is a 74 y.o. male who presents today for his Annual Wellness Visit. He reports consuming a {diet types:17450} diet. {Exercise:19826} He generally feels {well/fairly well/poorly:18703}. He reports sleeping {well/fairly well/poorly:18703}. He {does/does not:200015} have additional problems to discuss today.   HPI   F/u from AWV on 05/08/2020 with [facility].   {Show patient history (optional):23778::" "}   Medications: Outpatient Medications Prior to Visit  Medication Sig  . Ascorbic Acid (VITAMIN C) 1000 MG tablet Take 1,000 mg by mouth daily.   . Cholecalciferol (VITAMIN D3) 1000 units CAPS Take 1,000 Units by mouth daily.  . Cyanocobalamin (VITAMIN B 12 PO) Take 1 tablet by mouth at bedtime.   . cyclobenzaprine (FLEXERIL) 5 MG tablet Take 1 tablet (5 mg total) by mouth 3 (three) times daily as needed for muscle spasms. (Patient not taking: Reported on 07/12/2018)  . furosemide (LASIX) 20 MG tablet TAKE 1 TABLET BY MOUTH TWICE A DAY (Patient not taking: Reported on 10/26/2018)  . Glucos-Chond-Sterol-Fish Oil (GLUCOSAMINE CHONDROITIN PLUS) CAPS Take 1 tablet by mouth daily.   Marland Kitchen HYDROcodone-acetaminophen (NORCO) 5-325 MG tablet Take 1-2 tablets by mouth every 6 (six) hours as needed. (Patient not taking: Reported on 05/02/2019)  . Krill Oil 350 MG CAPS Take 350 mg by mouth daily.   . meloxicam (MOBIC) 15 MG tablet Take 1 tablet (15 mg total) by mouth daily.  . metoprolol succinate (TOPROL-XL) 25 MG 24 hr tablet TAKE 1/2 TABLET BY MOUTH EVERY DAY  . Multiple Vitamin tablet Take 1 tablet by mouth daily.   . Pyridoxine HCl (VITAMIN B6) 200 MG TABS Take 200 mg by mouth daily.   Marland Kitchen Ubiquinol 100 MG CAPS Take 100 mg by mouth daily.    No facility-administered  medications prior to visit.    Allergies  Allergen Reactions  . Penicillins Shortness Of Breath, Itching, Swelling and Other (See Comments)    Has patient had a PCN reaction causing immediate rash, facial/tongue/throat swelling, SOB or lightheadedness with hypotension: Yes Has patient had a PCN reaction causing severe rash involving mucus membranes or skin necrosis: No Has patient had a PCN reaction that required hospitalization: No Has patient had a PCN reaction occurring within the last 10 years: No If all of the above answers are "NO", then may proceed with Cephalosporin use.   . Gabapentin Swelling    legs    Patient Care Team: Jerrol Banana., MD as PCP - General (Family Medicine) Birder Robson, MD as Referring Physician (Ophthalmology)  Review of Systems  {Heme  Chem  Endocrine  Serology  Results Review (optional):23779::" "}  Objective    Vitals: There were no vitals taken for this visit. {Show previous vital signs (optional):23777::" "}  Physical Exam ***  Most recent functional status assessment: In your present state of health, do you have any difficulty performing the following activities: 05/08/2020  Hearing? Y  Comment Trouble hearing out of left ear. Does not wear a hearing aid.  Vision? N  Difficulty concentrating or making decisions? N  Walking or climbing stairs? Y  Comment Due to knee pains.  Dressing or bathing? N  Doing errands, shopping? N  Preparing Food and eating ? N  Using the Toilet?  N  In the past six months, have you accidently leaked urine? N  Do you have problems with loss of bowel control? N  Managing your Medications? N  Managing your Finances? N  Housekeeping or managing your Housekeeping? N  Some recent data might be hidden   Most recent fall risk assessment: Fall Risk  05/08/2020  Falls in the past year? 1  Number falls in past yr: 0  Comment Tripped over tote and broke nose.  Injury with Fall? 1  Follow up Falls  prevention discussed    Most recent depression screenings: PHQ 2/9 Scores 05/08/2020 10/26/2018  PHQ - 2 Score 0 0   Most recent cognitive screening: No flowsheet data found. Most recent Audit-C alcohol use screening Alcohol Use Disorder Test (AUDIT) 05/08/2020  1. How often do you have a drink containing alcohol? 2  2. How many drinks containing alcohol do you have on a typical day when you are drinking? 0  3. How often do you have six or more drinks on one occasion? 0  AUDIT-C Score 2  Alcohol Brief Interventions/Follow-up AUDIT Score <7 follow-up not indicated   A score of 3 or more in women, and 4 or more in men indicates increased risk for alcohol abuse, EXCEPT if all of the points are from question 1   No results found for any visits on 05/15/20.  Assessment & Plan     Annual wellness visit done today including the all of the following: Reviewed patient's Family Medical History Reviewed and updated list of patient's medical providers Assessment of cognitive impairment was done Assessed patient's functional ability Established a written schedule for health screening [place] Completed and Reviewed  Exercise Activities and Dietary recommendations Goals    . LIFESTYLE - DECREASE FALLS RISK     Recommend to remove any items from the home that may cause slips or trips.       Immunization History  Administered Date(s) Administered  . Influenza, High Dose Seasonal PF 06/28/2018  . PFIZER SARS-COV-2 Vaccination 10/30/2019, 11/20/2019  . Pneumococcal Conjugate-13 06/28/2014  . Pneumococcal Polysaccharide-23 08/24/2016  . Td 05/11/2000  . Tdap 12/07/2014  . Zoster 12/07/2014    Health Maintenance  Topic Date Due  . COLONOSCOPY  08/10/2019  . INFLUENZA VACCINE  04/07/2020  . TETANUS/TDAP  12/06/2024  . COVID-19 Vaccine  Completed  . Hepatitis C Screening  Completed  . PNA vac Low Risk Adult  Completed     Discussed health benefits of physical  activity, and encouraged him to engage in regular exercise appropriate for his age and condition.    ***  No follow-ups on file.     {provider attestation***:1}   Wilhemena Durie, MD  [facility] [phone] (phone) [phone] (fax)  [place]

## 2020-05-14 NOTE — Progress Notes (Signed)
Trena Platt Cummings,acting as a scribe for Wilhemena Durie, MD.,have documented all relevant documentation on the behalf of Wilhemena Durie, MD,as directed by  Wilhemena Durie, MD while in the presence of Wilhemena Durie, MD.  Complete physical exam   Patient: Stephen Ponce   DOB: 05-17-1946   74 y.o. Male  MRN: [id number] Visit Date: 05/15/2020  Today's healthcare provider: Wilhemena Durie, MD   Chief Complaint  Patient presents with  . Annual Exam   Subjective    Stephen Ponce is a 74 y.o. male who presents today for a complete physical exam.  He reports consuming a general diet. The patient does not participate in regular exercise at present. He generally feels well. He reports sleeping well. He does not have additional problems to discuss today.  Patient is becoming more hard of hearing. HPI  Patient had AWV with [facility] on 05/08/2020.  Past Medical History:  Diagnosis Date  . Actinic keratosis   . Arthritis   . Basal cell carcinoma 10/14/2016   Left top of shoulder. Superficial  . Carpal tunnel syndrome, bilateral   . Complication of anesthesia    Lost voice for 5 weeks after RCR  . Dental bridge present    permanent, top, front  . DVT (deep venous thrombosis) ([place]) 07/2018   LEFT LEG, after RCR  . Hypertension   . Renal failure    5 years ago with sepsis/uti  . 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 SCCis   Past Surgical History:  Procedure Laterality Date  . CARPAL TUNNEL RELEASE Left 04/10/2019   Procedure: CARPAL TUNNEL RELEASE;  Surgeon: Earnestine Leys, MD;  Location: [facility];  Service: Orthopedics;  Laterality: Left;  . CATARACT EXTRACTION W/PHACO Right 02/13/2020   Procedure: CATARACT EXTRACTION PHACO AND INTRAOCULAR LENS PLACEMENT (IOC) RIGHT 4.48  00:31.2;  Surgeon: Birder Robson, MD;  Location: [place];  Service: Ophthalmology;  Laterality: Right;  . CATARACT EXTRACTION  W/PHACO Left 04/01/2020   Procedure: CATARACT EXTRACTION PHACO AND INTRAOCULAR LENS PLACEMENT (IOC) LEFT 6.87 00:38.9 ;  Surgeon: Birder Robson, MD;  Location: [place];  Service: Ophthalmology;  Laterality: Left;  . COLONOSCOPY  08/09/2014   tubular adenoma  . OSTEOTOMY    . SHOULDER ARTHROSCOPY WITH OPEN ROTATOR CUFF REPAIR Left 07/18/2018   Procedure: SHOULDER ARTHROSCOPY WITH OPEN ROTATOR CUFF REPAIR;  Surgeon: Earnestine Leys, MD;  Location: [facility];  Service: Orthopedics;  Laterality: Left;   Social History   Socioeconomic History  . Marital status: Married    Spouse name: Not on file  . Number of children: 2  . Years of education: Not on file  . Highest education level: High school graduate  Occupational History  . Occupation: delivery driver  Tobacco Use  . Smoking status: Former Smoker    Packs/day: 3.00    Years: 35.00    Pack years: 105.00    Types: Cigarettes    Quit date: 09/07/1996    Years since quitting: 23.7  . Smokeless tobacco: Never Used  Vaping Use  . Vaping Use: Never used  Substance and Sexual Activity  . Alcohol use: Yes    Alcohol/week: 0.0 - 2.0 standard drinks    Comment: 1-2 beers a couple times a month  . Drug use: No  . Sexual activity: Not on file  Other Topics Concern  . Not on file  Social History Narrative  .  Not on file   Social Determinants of Health   Financial Resource Strain: Low Risk   . Difficulty of Paying Living Expenses: Not hard at all  Food Insecurity: No Food Insecurity  . Worried About Charity fundraiser in the Last Year: Never true  . Ran Out of Food in the Last Year: Never true  Transportation Needs: No Transportation Needs  . Lack of Transportation (Medical): No  . Lack of Transportation (Non-Medical): No  Physical Activity: Inactive  . Days of Exercise per Week: 0 days  . Minutes of Exercise per Session: 0 min  Stress: No Stress Concern Present  . Feeling of Stress : Only a little  Social  Connections: Socially Integrated  . Frequency of Communication with Friends and Family: More than three times a week  . Frequency of Social Gatherings with Friends and Family: More than three times a week  . Attends Religious Services: More than 4 times per year  . Active Member of Clubs or Organizations: Yes  . Attends Archivist Meetings: 1 to 4 times per year  . Marital Status: Married  Human resources officer Violence: Not At Risk  . Fear of Current or Ex-Partner: No  . Emotionally Abused: No  . Physically Abused: No  . Sexually Abused: No   Family Status  Relation Name Status  . Father  Deceased  . Mother  Alive  . Sister  Alive  . Brother  Alive  . Sister  Alive  . Sister  Alive  . Brother  Alive  . Brother  Deceased   Family History  Problem Relation Age of Onset  . CAD Father   . Stomach cancer Brother   . Lung cancer Brother   . Bone cancer Brother    Allergies  Allergen Reactions  . Penicillins Shortness Of Breath, Itching, Swelling and Other (See Comments)    Has patient had a PCN reaction causing immediate rash, facial/tongue/throat swelling, SOB or lightheadedness with hypotension: Yes Has patient had a PCN reaction causing severe rash involving mucus membranes or skin necrosis: No Has patient had a PCN reaction that required hospitalization: No Has patient had a PCN reaction occurring within the last 10 years: No If all of the above answers are "NO", then may proceed with Cephalosporin use.   . Gabapentin Swelling    legs    Patient Care Team: Jerrol Banana., MD as PCP - General (Family Medicine) Birder Robson, MD as Referring Physician (Ophthalmology)   Medications: Outpatient Medications Prior to Visit  Medication Sig  . Ascorbic Acid (VITAMIN C) 1000 MG tablet Take 1,000 mg by mouth daily.   . Cholecalciferol (VITAMIN D3) 1000 units CAPS Take 1,000 Units by mouth daily.  . Glucos-Chond-Sterol-Fish Oil (GLUCOSAMINE CHONDROITIN PLUS)  CAPS Take 1 tablet by mouth daily.   Javier Docker Oil 350 MG CAPS Take 350 mg by mouth daily.   . meloxicam (MOBIC) 15 MG tablet Take 1 tablet (15 mg total) by mouth daily.  . metoprolol succinate (TOPROL-XL) 25 MG 24 hr tablet TAKE 1/2 TABLET BY MOUTH EVERY DAY  . Multiple Vitamin tablet Take 1 tablet by mouth daily.   . Pyridoxine HCl (VITAMIN B6) 200 MG TABS Take 200 mg by mouth daily.   Marland Kitchen Ubiquinol 100 MG CAPS Take 100 mg by mouth daily.   . Cyanocobalamin (VITAMIN B 12 PO) Take 1 tablet by mouth at bedtime.  (Patient not taking: Reported on 05/15/2020)  . cyclobenzaprine (FLEXERIL) 5 MG tablet  Take 1 tablet (5 mg total) by mouth 3 (three) times daily as needed for muscle spasms. (Patient not taking: Reported on 07/12/2018)  . furosemide (LASIX) 20 MG tablet TAKE 1 TABLET BY MOUTH TWICE A DAY (Patient not taking: Reported on 10/26/2018)  . HYDROcodone-acetaminophen (NORCO) 5-325 MG tablet Take 1-2 tablets by mouth every 6 (six) hours as needed. (Patient not taking: Reported on 05/02/2019)   No facility-administered medications prior to visit.    Review of Systems  Constitutional: Negative.   HENT: Negative.   Eyes: Negative.   Respiratory: Negative.   Cardiovascular: Negative.   Gastrointestinal: Negative.   Endocrine: Negative.   Genitourinary: Negative.   Musculoskeletal: Negative.   Skin: Negative.   Allergic/Immunologic: Negative.   Neurological: Negative.   Hematological: Negative.   Psychiatric/Behavioral: Negative.        Objective    BP 118/72 (BP Location: Left Arm, Patient Position: Sitting, Cuff Size: Large)   Pulse 71   Temp 98.2 F (36.8 C) (Oral)   Ht 5\' 9"  (1.753 m)   Wt 238 lb 9.6 oz (108.2 kg)   BMI 35.24 kg/m     Physical Exam Vitals reviewed.  Constitutional:      Appearance: Normal appearance.  HENT:     Head: Normocephalic and atraumatic.     Right Ear: External ear normal.     Left Ear: External ear normal.  Eyes:     General: No scleral  icterus.    Conjunctiva/sclera: Conjunctivae normal.  Cardiovascular:     Rate and Rhythm: Normal rate and regular rhythm.     Pulses: Normal pulses.     Heart sounds: Normal heart sounds.  Pulmonary:     Effort: Pulmonary effort is normal.     Breath sounds: Normal breath sounds.  Abdominal:     Palpations: Abdomen is soft.  Genitourinary:    Penis: Normal.      Testes: Normal.  Musculoskeletal:     Right lower leg: No edema.     Left lower leg: No edema.  Skin:    General: Skin is warm and dry.  Neurological:     General: No focal deficit present.     Mental Status: He is alert and oriented to person, place, and time.  Psychiatric:        Mood and Affect: Mood normal.        Behavior: Behavior normal.        Thought Content: Thought content normal.        Judgment: Judgment normal.       Last depression screening scores PHQ 2/9 Scores 05/08/2020 10/26/2018 08/24/2017  PHQ - 2 Score 0 0 0   Last fall risk screening Fall Risk  05/08/2020  Falls in the past year? 1  Number falls in past yr: 0  Comment Tripped over tote and broke nose.  Injury with Fall? 1  Follow up Falls prevention discussed   Last Audit-C alcohol use screening Alcohol Use Disorder Test (AUDIT) 05/08/2020  1. How often do you have a drink containing alcohol? 2  2. How many drinks containing alcohol do you have on a typical day when you are drinking? 0  3. How often do you have six or more drinks on one occasion? 0  AUDIT-C Score 2  Alcohol Brief Interventions/Follow-up AUDIT Score <7 follow-up not indicated   A score of 3 or more in women, and 4 or more in men indicates increased risk for alcohol abuse, EXCEPT if all of  the points are from question 1   No results found for any visits on 05/15/20.  Assessment & Plan    Routine Health Maintenance and Physical Exam  Exercise Activities and Dietary recommendations Goals    . LIFESTYLE - DECREASE FALLS RISK     Recommend to remove any items from the  home that may cause slips or trips.       Immunization History  Administered Date(s) Administered  . Influenza, High Dose Seasonal PF 06/28/2018  . PFIZER SARS-COV-2 Vaccination 10/30/2019, 11/20/2019  . Pneumococcal Conjugate-13 06/28/2014  . Pneumococcal Polysaccharide-23 08/24/2016  . Td 05/11/2000  . Tdap 12/07/2014  . Zoster 12/07/2014    Health Maintenance  Topic Date Due  . COLONOSCOPY  08/10/2019  . INFLUENZA VACCINE  04/07/2020  . TETANUS/TDAP  12/06/2024  . COVID-19 Vaccine  Completed  . Hepatitis C Screening  Completed  . PNA vac Low Risk Adult  Completed    Discussed health benefits of physical activity, and encouraged him to engage in regular exercise appropriate for his age and condition.  1. Annual physical exam   2. Essential hypertension  - CBC with Differential/Platelet - Comprehensive metabolic panel - TSH - Vitamin B12  3. B12 deficiency  - CBC with Differential/Platelet - Comprehensive metabolic panel - TSH - Vitamin B12  4. Decreased hearing, unspecified laterality  - Ambulatory referral to ENT   No follow-ups on file.     I, Wilhemena Durie, MD, have reviewed all documentation for this visit. The documentation on 05/24/20 for the exam, diagnosis, procedures, and orders are all accurate and complete.    Alayia Meggison Cranford Mon, MD  [facility] [phone] (phone) [phone] (fax)  [place]

## 2020-05-15 ENCOUNTER — Other Ambulatory Visit: Payer: Self-pay

## 2020-05-15 ENCOUNTER — Encounter: Payer: Self-pay | Admitting: Family Medicine

## 2020-05-15 ENCOUNTER — Ambulatory Visit (INDEPENDENT_AMBULATORY_CARE_PROVIDER_SITE_OTHER): Payer: Medicare HMO | Admitting: Family Medicine

## 2020-05-15 VITALS — BP 118/72 | HR 71 | Temp 98.2°F | Ht 69.0 in | Wt 238.6 lb

## 2020-05-15 DIAGNOSIS — I10 Essential (primary) hypertension: Secondary | ICD-10-CM | POA: Diagnosis not present

## 2020-05-15 DIAGNOSIS — H9190 Unspecified hearing loss, unspecified ear: Secondary | ICD-10-CM

## 2020-05-15 DIAGNOSIS — E538 Deficiency of other specified B group vitamins: Secondary | ICD-10-CM | POA: Diagnosis not present

## 2020-05-15 DIAGNOSIS — Z0000 Encounter for general adult medical examination without abnormal findings: Secondary | ICD-10-CM | POA: Diagnosis not present

## 2020-05-16 LAB — CBC WITH DIFFERENTIAL/PLATELET
Basophils Absolute: 0.1 10*3/uL (ref 0.0–0.2)
Basos: 1 %
EOS (ABSOLUTE): 0.4 10*3/uL (ref 0.0–0.4)
Eos: 6 %
Hematocrit: 49.1 % (ref 37.5–51.0)
Hemoglobin: 16.5 g/dL (ref 13.0–17.7)
Immature Grans (Abs): 0 10*3/uL (ref 0.0–0.1)
Immature Granulocytes: 0 %
Lymphocytes Absolute: 1.4 10*3/uL (ref 0.7–3.1)
Lymphs: 20 %
MCH: 30.5 pg (ref 26.6–33.0)
MCHC: 33.6 g/dL (ref 31.5–35.7)
MCV: 91 fL (ref 79–97)
Monocytes Absolute: 0.8 10*3/uL (ref 0.1–0.9)
Monocytes: 11 %
Neutrophils Absolute: 4.3 10*3/uL (ref 1.4–7.0)
Neutrophils: 62 %
Platelets: 217 10*3/uL (ref 150–450)
RBC: 5.41 x10E6/uL (ref 4.14–5.80)
RDW: 12.6 % (ref 11.6–15.4)
WBC: 7 10*3/uL (ref 3.4–10.8)

## 2020-05-16 LAB — COMPREHENSIVE METABOLIC PANEL
ALT: 40 IU/L (ref 0–44)
AST: 26 IU/L (ref 0–40)
Albumin/Globulin Ratio: 1.5 (ref 1.2–2.2)
Albumin: 4.3 g/dL (ref 3.7–4.7)
Alkaline Phosphatase: 56 IU/L (ref 48–121)
BUN/Creatinine Ratio: 28 — ABNORMAL HIGH (ref 10–24)
BUN: 17 mg/dL (ref 8–27)
Bilirubin Total: 1 mg/dL (ref 0.0–1.2)
CO2: 22 mmol/L (ref 20–29)
Calcium: 9.7 mg/dL (ref 8.6–10.2)
Chloride: 104 mmol/L (ref 96–106)
Creatinine, Ser: 0.61 mg/dL — ABNORMAL LOW (ref 0.76–1.27)
GFR calc Af Amer: 114 mL/min/{1.73_m2} (ref >59)
GFR calc non Af Amer: 98 mL/min/{1.73_m2} (ref >59)
Globulin, Total: 2.8 g/dL (ref 1.5–4.5)
Glucose: 83 mg/dL (ref 65–99)
Potassium: 4.5 mmol/L (ref 3.5–5.2)
Sodium: 142 mmol/L (ref 134–144)
Total Protein: 7.1 g/dL (ref 6.0–8.5)

## 2020-05-16 LAB — TSH: TSH: 1.25 u[IU]/mL (ref 0.450–4.500)

## 2020-05-16 LAB — VITAMIN B12: Vitamin B-12: 1321 pg/mL — ABNORMAL HIGH (ref 232–1245)

## 2020-05-22 ENCOUNTER — Telehealth (INDEPENDENT_AMBULATORY_CARE_PROVIDER_SITE_OTHER): Payer: Self-pay | Admitting: Gastroenterology

## 2020-05-22 ENCOUNTER — Other Ambulatory Visit: Payer: Self-pay

## 2020-05-22 DIAGNOSIS — Z86010 Personal history of colonic polyps: Secondary | ICD-10-CM

## 2020-05-22 DIAGNOSIS — Z1211 Encounter for screening for malignant neoplasm of colon: Secondary | ICD-10-CM

## 2020-05-22 MED ORDER — NA SULFATE-K SULFATE-MG SULF 17.5-3.13-1.6 GM/177ML PO SOLN: 1.0000 | Freq: Once | ORAL | 0 refills | Status: AC

## 2020-05-22 NOTE — Progress Notes (Signed)
Gastroenterology Pre-Procedure Review  Request Date: Monday 06/10/20 Requesting Physician: Dr. Allen Norris  PATIENT REVIEW QUESTIONS: The patient responded to the following health history questions as indicated:    1. Are you having any GI issues? no 2. Do you have a personal history of Polyps? yes (08/09/14 last colonoscopy) 3. Do you have a family history of Colon Cancer or Polyps? no 4. Diabetes Mellitus? no 5. Joint replacements in the past 12 months?no 6. Major health problems in the past 3 months?Cataract Surgery 04/01/20 and 02/13/20 7. Any artificial heart valves, MVP, or defibrillator?no    MEDICATIONS & ALLERGIES:    Patient reports the following regarding taking any anticoagulation/antiplatelet therapy:   Plavix, Coumadin, Eliquis, Xarelto, Lovenox, Pradaxa, Brilinta, or Effient? no Aspirin? no  Patient confirms/reports the following medications:  Current Outpatient Medications  Medication Sig Dispense Refill   Ascorbic Acid (VITAMIN C) 1000 MG tablet Take 1,000 mg by mouth daily.      Cholecalciferol (VITAMIN D3) 1000 units CAPS Take 1,000 Units by mouth daily.     Glucos-Chond-Sterol-Fish Oil (GLUCOSAMINE CHONDROITIN PLUS) CAPS Take 1 tablet by mouth daily.      Krill Oil 350 MG CAPS Take 350 mg by mouth daily.      meloxicam (MOBIC) 15 MG tablet Take 1 tablet (15 mg total) by mouth daily. 30 tablet 3   metoprolol succinate (TOPROL-XL) 25 MG 24 hr tablet TAKE 1/2 TABLET BY MOUTH EVERY DAY 45 tablet 0   Multiple Vitamin tablet Take 1 tablet by mouth daily.      Pyridoxine HCl (VITAMIN B6) 200 MG TABS Take 200 mg by mouth daily.      Ubiquinol 100 MG CAPS Take 100 mg by mouth daily.      Cyanocobalamin (VITAMIN B 12 PO) Take 1 tablet by mouth at bedtime.  (Patient not taking: Reported on 05/15/2020)     cyclobenzaprine (FLEXERIL) 5 MG tablet Take 1 tablet (5 mg total) by mouth 3 (three) times daily as needed for muscle spasms. (Patient not taking: Reported on 07/12/2018)  30 tablet 0   furosemide (LASIX) 20 MG tablet TAKE 1 TABLET BY MOUTH TWICE A DAY (Patient not taking: Reported on 10/26/2018) 60 tablet 4   HYDROcodone-acetaminophen (NORCO) 5-325 MG tablet Take 1-2 tablets by mouth every 6 (six) hours as needed. (Patient not taking: Reported on 05/02/2019) 50 tablet 0   Na Sulfate-K Sulfate-Mg Sulf 17.5-3.13-1.6 GM/177ML SOLN Take 1 kit by mouth once for 1 dose. 354 mL 0   No current facility-administered medications for this visit.    Patient confirms/reports the following allergies:  Allergies  Allergen Reactions   Penicillins Shortness Of Breath, Itching, Swelling and Other (See Comments)    Has patient had a PCN reaction causing immediate rash, facial/tongue/throat swelling, SOB or lightheadedness with hypotension: Yes Has patient had a PCN reaction causing severe rash involving mucus membranes or skin necrosis: No Has patient had a PCN reaction that required hospitalization: No Has patient had a PCN reaction occurring within the last 10 years: No If all of the above answers are "NO", then may proceed with Cephalosporin use.    Gabapentin Swelling    legs    Orders Placed This Encounter  Procedures   Procedural/ Surgical Case Request: COLONOSCOPY WITH PROPOFOL    Standing Status:   Standing    Number of Occurrences:   1    Order Specific Question:   Pre-op diagnosis    Answer:   screening colonoscopy, personal history of colon polyps  Order Specific Question:   CPT Code    Answer:   [phone]    AUTHORIZATION INFORMATION Primary Insurance: 1D#: Group #:  Secondary Insurance: 1D#: Group #:  SCHEDULE INFORMATION: Date: Monday 06/10/20 Time: Location:[facility]

## 2020-06-03 ENCOUNTER — Encounter: Payer: Self-pay | Admitting: Gastroenterology

## 2020-06-03 ENCOUNTER — Other Ambulatory Visit: Payer: Self-pay

## 2020-06-05 DIAGNOSIS — H6123 Impacted cerumen, bilateral: Secondary | ICD-10-CM | POA: Diagnosis not present

## 2020-06-05 DIAGNOSIS — H903 Sensorineural hearing loss, bilateral: Secondary | ICD-10-CM | POA: Diagnosis not present

## 2020-06-06 ENCOUNTER — Other Ambulatory Visit: Payer: Medicare HMO | Attending: Gastroenterology

## 2020-06-06 NOTE — Discharge Instructions (Signed)
General Anesthesia, Adult, Care After This sheet gives you information about how to care for yourself after your procedure. Your health care provider may also give you more specific instructions. If you have problems or questions, contact your health care provider. What can I expect after the procedure? After the procedure, the following side effects are common:  Pain or discomfort at the IV site.  Nausea.  Vomiting.  Sore throat.  Trouble concentrating.  Feeling cold or chills.  Weak or tired.  Sleepiness and fatigue.  Soreness and body aches. These side effects can affect parts of the body that were not involved in surgery. Follow these instructions at home:  For at least 24 hours after the procedure:  Have a responsible adult stay with you. It is important to have someone help care for you until you are awake and alert.  Rest as needed.  Do not: ? Participate in activities in which you could fall or become injured. ? Drive. ? Use heavy machinery. ? Drink alcohol. ? Take sleeping pills or medicines that cause drowsiness. ? Make important decisions or sign legal documents. ? Take care of children on your own. Eating and drinking  Follow any instructions from your health care provider about eating or drinking restrictions.  When you feel hungry, start by eating small amounts of foods that are soft and easy to digest (bland), such as toast. Gradually return to your regular diet.  Drink enough fluid to keep your urine pale yellow.  If you vomit, rehydrate by drinking water, juice, or clear broth. General instructions  If you have sleep apnea, surgery and certain medicines can increase your risk for breathing problems. Follow instructions from your health care provider about wearing your sleep device: ? Anytime you are sleeping, including during daytime naps. ? While taking prescription pain medicines, sleeping medicines, or medicines that make you drowsy.  Return to  your normal activities as told by your health care provider. Ask your health care provider what activities are safe for you.  Take over-the-counter and prescription medicines only as told by your health care provider.  If you smoke, do not smoke without supervision.  Keep all follow-up visits as told by your health care provider. This is important. Contact a health care provider if:  You have nausea or vomiting that does not get better with medicine.  You cannot eat or drink without vomiting.  You have pain that does not get better with medicine.  You are unable to pass urine.  You develop a skin rash.  You have a fever.  You have redness around your IV site that gets worse. Get help right away if:  You have difficulty breathing.  You have chest pain.  You have blood in your urine or stool, or you vomit blood. Summary  After the procedure, it is common to have a sore throat or nausea. It is also common to feel tired.  Have a responsible adult stay with you for the first 24 hours after general anesthesia. It is important to have someone help care for you until you are awake and alert.  When you feel hungry, start by eating small amounts of foods that are soft and easy to digest (bland), such as toast. Gradually return to your regular diet.  Drink enough fluid to keep your urine pale yellow.  Return to your normal activities as told by your health care provider. Ask your health care provider what activities are safe for you. This information is not   intended to replace advice given to you by your health care provider. Make sure you discuss any questions you have with your health care provider. Document Revised: 08/27/2017 Document Reviewed: 04/09/2017 Elsevier Patient Education  2020 Elsevier Inc.  

## 2020-06-07 ENCOUNTER — Other Ambulatory Visit
Admission: RE | Admit: 2020-06-07 | Discharge: 2020-06-07 | Disposition: A | Discharge status: Home / Self Care | Payer: Medicare HMO | Source: Ambulatory Visit | Attending: Gastroenterology | Admitting: Gastroenterology

## 2020-06-07 ENCOUNTER — Other Ambulatory Visit: Payer: Medicare HMO

## 2020-06-07 DIAGNOSIS — Z20822 Contact with and (suspected) exposure to covid-19: Secondary | ICD-10-CM | POA: Insufficient documentation

## 2020-06-07 DIAGNOSIS — Z01812 Encounter for preprocedural laboratory examination: Secondary | ICD-10-CM | POA: Insufficient documentation

## 2020-06-08 LAB — SARS CORONAVIRUS 2 (TAT 6-24 HRS): SARS Coronavirus 2: NEGATIVE

## 2020-06-10 ENCOUNTER — Encounter: Payer: Self-pay | Admitting: Gastroenterology

## 2020-06-10 ENCOUNTER — Ambulatory Visit
Admission: RE | Admit: 2020-06-10 | Discharge: 2020-06-10 | Disposition: A | Discharge status: Home / Self Care | Payer: Medicare HMO | Attending: Gastroenterology | Admitting: Gastroenterology

## 2020-06-10 ENCOUNTER — Ambulatory Visit: Payer: Medicare HMO | Admitting: Anesthesiology

## 2020-06-10 ENCOUNTER — Other Ambulatory Visit: Payer: Self-pay

## 2020-06-10 ENCOUNTER — Encounter
Admission: RE | Disposition: A | Discharge status: Home / Self Care | Payer: Self-pay | Source: Home / Self Care | Attending: Gastroenterology

## 2020-06-10 DIAGNOSIS — K635 Polyp of colon: Secondary | ICD-10-CM | POA: Diagnosis not present

## 2020-06-10 DIAGNOSIS — I129 Hypertensive chronic kidney disease with stage 1 through stage 4 chronic kidney disease, or unspecified chronic kidney disease: Secondary | ICD-10-CM | POA: Insufficient documentation

## 2020-06-10 DIAGNOSIS — Z880 Allergy status to penicillin: Secondary | ICD-10-CM | POA: Insufficient documentation

## 2020-06-10 DIAGNOSIS — Z801 Family history of malignant neoplasm of trachea, bronchus and lung: Secondary | ICD-10-CM | POA: Diagnosis not present

## 2020-06-10 DIAGNOSIS — Z800 Family history of malignant neoplasm of digestive organs: Secondary | ICD-10-CM | POA: Insufficient documentation

## 2020-06-10 DIAGNOSIS — D123 Benign neoplasm of transverse colon: Secondary | ICD-10-CM | POA: Diagnosis not present

## 2020-06-10 DIAGNOSIS — Z86010 Personal history of colonic polyps: Secondary | ICD-10-CM | POA: Diagnosis not present

## 2020-06-10 DIAGNOSIS — K5730 Diverticulosis of large intestine without perforation or abscess without bleeding: Secondary | ICD-10-CM | POA: Diagnosis not present

## 2020-06-10 DIAGNOSIS — Z9842 Cataract extraction status, left eye: Secondary | ICD-10-CM | POA: Diagnosis not present

## 2020-06-10 DIAGNOSIS — Z87891 Personal history of nicotine dependence: Secondary | ICD-10-CM | POA: Diagnosis not present

## 2020-06-10 DIAGNOSIS — Z9841 Cataract extraction status, right eye: Secondary | ICD-10-CM | POA: Diagnosis not present

## 2020-06-10 DIAGNOSIS — M1990 Unspecified osteoarthritis, unspecified site: Secondary | ICD-10-CM | POA: Diagnosis not present

## 2020-06-10 DIAGNOSIS — Z888 Allergy status to other drugs, medicaments and biological substances status: Secondary | ICD-10-CM | POA: Insufficient documentation

## 2020-06-10 DIAGNOSIS — N189 Chronic kidney disease, unspecified: Secondary | ICD-10-CM | POA: Insufficient documentation

## 2020-06-10 DIAGNOSIS — K640 First degree hemorrhoids: Secondary | ICD-10-CM | POA: Insufficient documentation

## 2020-06-10 DIAGNOSIS — Z86718 Personal history of other venous thrombosis and embolism: Secondary | ICD-10-CM | POA: Diagnosis not present

## 2020-06-10 DIAGNOSIS — Z791 Long term (current) use of non-steroidal anti-inflammatories (NSAID): Secondary | ICD-10-CM | POA: Diagnosis not present

## 2020-06-10 DIAGNOSIS — Z1211 Encounter for screening for malignant neoplasm of colon: Secondary | ICD-10-CM | POA: Diagnosis not present

## 2020-06-10 DIAGNOSIS — Z808 Family history of malignant neoplasm of other organs or systems: Secondary | ICD-10-CM | POA: Insufficient documentation

## 2020-06-10 DIAGNOSIS — Z961 Presence of intraocular lens: Secondary | ICD-10-CM | POA: Insufficient documentation

## 2020-06-10 DIAGNOSIS — Z79899 Other long term (current) drug therapy: Secondary | ICD-10-CM | POA: Insufficient documentation

## 2020-06-10 DIAGNOSIS — Z85828 Personal history of other malignant neoplasm of skin: Secondary | ICD-10-CM | POA: Insufficient documentation

## 2020-06-10 HISTORY — PX: COLONOSCOPY WITH PROPOFOL: SHX5780

## 2020-06-10 HISTORY — PX: POLYPECTOMY: SHX5525

## 2020-06-10 SURGERY — COLONOSCOPY WITH PROPOFOL
Anesthesia: Monitor Anesthesia Care | Site: Rectum

## 2020-06-10 MED ORDER — ACETAMINOPHEN 160 MG/5ML PO SOLN: 325.0000 mg | Freq: Once | ORAL | Status: DC

## 2020-06-10 MED ORDER — SODIUM CHLORIDE 0.9 % IV SOLN: INTRAVENOUS | Status: DC

## 2020-06-10 MED ORDER — LACTATED RINGERS IV SOLN: INTRAVENOUS | Status: DC

## 2020-06-10 MED ORDER — PROPOFOL 10 MG/ML IV BOLUS
INTRAVENOUS | Status: DC | PRN
  Administered 2020-06-10 (×2): 30 mg via INTRAVENOUS
  Administered 2020-06-10: 70 mg via INTRAVENOUS
  Administered 2020-06-10 (×4): 20 mg via INTRAVENOUS

## 2020-06-10 MED ORDER — STERILE WATER FOR IRRIGATION IR SOLN: Status: DC | PRN

## 2020-06-10 MED ORDER — ACETAMINOPHEN 325 MG PO TABS: 325.0000 mg | ORAL_TABLET | Freq: Once | ORAL | Status: DC

## 2020-06-10 MED ORDER — LIDOCAINE HCL (CARDIAC) PF 100 MG/5ML IV SOSY
PREFILLED_SYRINGE | INTRAVENOUS | Status: DC | PRN
  Administered 2020-06-10: 50 mg via INTRAVENOUS

## 2020-06-10 SURGICAL SUPPLY — 7 items
GOWN CVR UNV OPN BCK APRN NK (MISCELLANEOUS) ×2 IMPLANT
GOWN ISOL THUMB LOOP REG UNIV (MISCELLANEOUS) ×4
KIT ENDO PROCEDURE OLY (KITS) ×2 IMPLANT
MANIFOLD NEPTUNE II (INSTRUMENTS) ×2 IMPLANT
SNARE SHORT THROW 13M SML OVAL (MISCELLANEOUS) ×2 IMPLANT
TRAP ETRAP POLY (MISCELLANEOUS) ×2 IMPLANT
WATER STERILE IRR 250ML POUR (IV SOLUTION) ×2 IMPLANT

## 2020-06-10 NOTE — Op Note (Signed)
[facility] Patient Name: Stephen Ponce Procedure Date: 06/10/2020 9:23 AM MRN: [id number] Account #: [id number] Date of Birth: [birth year] Admit Type: Outpatient Age: 74 Room: [facility] OR ROOM 01 Gender: Male Note Status: Finalized Procedure:             Colonoscopy Indications:           High risk colon cancer surveillance: Personal history                         of colonic polyps Providers:             Lucilla Lame MD, MD Referring MD:          Janine Ores. Rosanna Randy, MD (Referring MD) Medicines:             Propofol per Anesthesia Complications:         No immediate complications. Procedure:             Pre-Anesthesia Assessment:                        - Prior to the procedure, a History and Physical was                         performed, and patient medications and allergies were                         reviewed. The patient's tolerance of previous                         anesthesia was also reviewed. The risks and benefits                         of the procedure and the sedation options and risks                         were discussed with the patient. All questions were                         answered, and informed consent was obtained. Prior                         Anticoagulants: The patient has taken no previous                         anticoagulant or antiplatelet agents. ASA Grade                         Assessment: II - A patient with mild systemic disease.                         After reviewing the risks and benefits, the patient                         was deemed in satisfactory condition to undergo the                         procedure.  After obtaining informed consent, the colonoscope was                         passed under direct vision. Throughout the procedure,                         the patient's blood pressure, pulse, and oxygen                         saturations were monitored continuously. The                          Colonoscope was introduced through the anus and                         advanced to the the cecum, identified by appendiceal                         orifice and ileocecal valve. The colonoscopy was                         performed without difficulty. The patient tolerated                         the procedure well. The quality of the bowel                         preparation was excellent. Findings:      The perianal and digital rectal examinations were normal.      A 6 mm polyp was found in the transverse colon. The polyp was sessile.       The polyp was removed with a cold snare. Resection and retrieval were       complete.      Multiple small-mouthed diverticula were found in the sigmoid colon and       descending colon.      Non-bleeding internal hemorrhoids were found during retroflexion. The       hemorrhoids were Grade I (internal hemorrhoids that do not prolapse). Impression:            - One 6 mm polyp in the transverse colon, removed with                         a cold snare. Resected and retrieved.                        - Diverticulosis in the sigmoid colon and in the                         descending colon.                        - Non-bleeding internal hemorrhoids. Recommendation:        - Discharge patient to home.                        - Resume previous diet.                        - Continue present medications.                        -  Await pathology results. Procedure Code(s):     --- Professional ---                        [id number], Colonoscopy, flexible; with removal of                         tumor(s), polyp(s), or other lesion(s) by snare                         technique Diagnosis Code(s):     --- Professional ---                        Z86.010, Personal history of colonic polyps                        K63.5, Polyp of colon CPT copyright 2019 American Medical Association. All rights reserved. The codes documented in this report are preliminary  and upon coder review may  be revised to meet current compliance requirements. Lucilla Lame MD, MD 06/10/2020 9:43:44 AM This report has been signed electronically. Number of Addenda: 0 Note Initiated On: 06/10/2020 9:23 AM Scope Withdrawal Time: 0 hours 7 minutes 32 seconds  Total Procedure Duration: 0 hours 10 minutes 8 seconds  Estimated Blood Loss:  Estimated blood loss: none.      [facility]

## 2020-06-10 NOTE — Transfer of Care (Signed)
Immediate Anesthesia Transfer of Care Note  Patient: Stephen Ponce  Procedure(s) Performed: COLONOSCOPY WITH BIOPSY (N/A Rectum) POLYPECTOMY (N/A Rectum)  Patient Location: PACU  Anesthesia Type: MAC  Level of Consciousness: awake, alert  and patient cooperative  Airway and Oxygen Therapy: Patient Spontanous Breathing and Patient connected to supplemental oxygen  Post-op Assessment: Post-op Vital signs reviewed, Patient's Cardiovascular Status Stable, Respiratory Function Stable, Patent Airway and No signs of Nausea or vomiting  Post-op Vital Signs: Reviewed and stable  Complications: No complications documented.

## 2020-06-10 NOTE — Anesthesia Postprocedure Evaluation (Signed)
Anesthesia Post Note  Patient: Stephen Ponce  Procedure(s) Performed: COLONOSCOPY WITH BIOPSY (N/A Rectum) POLYPECTOMY (N/A Rectum)     Patient location during evaluation: PACU Anesthesia Type: MAC Level of consciousness: awake and alert and oriented Pain management: satisfactory to patient Vital Signs Assessment: post-procedure vital signs reviewed and stable Respiratory status: spontaneous breathing, nonlabored ventilation and respiratory function stable Cardiovascular status: blood pressure returned to baseline and stable Postop Assessment: Adequate PO intake and No signs of nausea or vomiting Anesthetic complications: no   No complications documented.  Raliegh Ip

## 2020-06-10 NOTE — H&P (Addendum)
Lucilla Lame, MD [address] [place], [place] [address] Phone:[phone] Fax : [phone]  Primary Care Physician:  Jerrol Banana., MD Primary Gastroenterologist:  Dr. Allen Norris  Pre-Procedure History & Physical: HPI:  ISIAIH HOLLENBACH is a 74 y.o. male is here for an colonoscopy.   Past Medical History:  Diagnosis Date  . Actinic keratosis   . Arthritis   . Basal cell carcinoma 10/14/2016   Left top of shoulder. Superficial  . Carpal tunnel syndrome, bilateral   . Complication of anesthesia    Lost voice for 5 weeks after RCR  . Dental bridge present    permanent, top, front  . DVT (deep venous thrombosis) ([facility]) 07/2018   LEFT LEG, after RCR  . Hypertension   . Renal failure    5 years ago with sepsis/uti  . 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 SCCis    Past Surgical History:  Procedure Laterality Date  . CARPAL TUNNEL RELEASE Left 04/10/2019   Procedure: CARPAL TUNNEL RELEASE;  Surgeon: Earnestine Leys, MD;  Location: [facility] ORS;  Service: Orthopedics;  Laterality: Left;  . CATARACT EXTRACTION W/PHACO Right 02/13/2020   Procedure: CATARACT EXTRACTION PHACO AND INTRAOCULAR LENS PLACEMENT (IOC) RIGHT 4.48  00:31.2;  Surgeon: Birder Robson, MD;  Location: [place];  Service: Ophthalmology;  Laterality: Right;  . CATARACT EXTRACTION W/PHACO Left 04/01/2020   Procedure: CATARACT EXTRACTION PHACO AND INTRAOCULAR LENS PLACEMENT (IOC) LEFT 6.87 00:38.9 ;  Surgeon: Birder Robson, MD;  Location: [place];  Service: Ophthalmology;  Laterality: Left;  . COLONOSCOPY  08/09/2014   tubular adenoma  . OSTEOTOMY    . SHOULDER ARTHROSCOPY WITH OPEN ROTATOR CUFF REPAIR Left 07/18/2018   Procedure: SHOULDER ARTHROSCOPY WITH OPEN ROTATOR CUFF REPAIR;  Surgeon: Earnestine Leys, MD;  Location: [facility] OR[facility];  Service: Orthopedics;  Laterality: Left;    Prior to Admission medications   Medication  Sig Start Date End Date Taking? Authorizing Provider  Ascorbic Acid (VITAMIN C) 1000 MG tablet Take 1,000 mg by mouth daily.    Yes [provider]  Cholecalciferol (VITAMIN D3) 1000 units CAPS Take 1,000 Units by mouth daily.   Yes [provider]  Glucos-Chond-Sterol-Fish Oil (GLUCOSAMINE CHONDROITIN PLUS) CAPS Take 1 tablet by mouth daily.    Yes [provider]  Javier Docker Oil 350 MG CAPS Take 350 mg by mouth daily.    Yes [provider]  meloxicam (MOBIC) 15 MG tablet Take 1 tablet (15 mg total) by mouth daily. 04/10/19  Yes Earnestine Leys, MD  metoprolol succinate (TOPROL-XL) 25 MG 24 hr tablet TAKE 1/2 TABLET BY MOUTH EVERY DAY 03/31/20  Yes Jerrol Banana., MD  Multiple Vitamin tablet Take 1 tablet by mouth daily.    Yes [provider]  Pyridoxine HCl (VITAMIN B6) 200 MG TABS Take 200 mg by mouth daily.    Yes [provider]  Ubiquinol 100 MG CAPS Take 100 mg by mouth daily.    Yes [provider]  Cyanocobalamin (VITAMIN B 12 PO) Take 1 tablet by mouth at bedtime.  Patient not taking: Reported on 05/15/2020    [provider]  cyclobenzaprine (FLEXERIL) 5 MG tablet Take 1 tablet (5 mg total) by mouth 3 (three) times daily as needed for muscle spasms. Patient not taking: Reported on 07/12/2018 02/14/18   Sherrie George B, FNP  furosemide (LASIX) 20 MG tablet TAKE 1 TABLET BY MOUTH TWICE  A DAY Patient not taking: Reported on 10/26/2018 10/04/18   Jerrol Banana., MD  HYDROcodone-acetaminophen [facility]) 5-325 MG tablet Take 1-2 tablets by mouth every 6 (six) hours as needed. Patient not taking: Reported on 05/02/2019 04/10/19   Earnestine Leys, MD    Allergies as of 05/22/2020 - Review Complete 05/15/2020  Allergen Reaction Noted  . Penicillins Shortness Of Breath, Itching, Swelling, and Other (See Comments) 06/28/2015  . Gabapentin Swelling 02/01/2020    Family History  Problem Relation Age of Onset  . CAD Father    . Stomach cancer Brother   . Lung cancer Brother   . Bone cancer Brother     Social History   Socioeconomic History  . Marital status: Married    Spouse name: Not on file  . Number of children: 2  . Years of education: Not on file  . Highest education level: High school graduate  Occupational History  . Occupation: delivery driver  Tobacco Use  . Smoking status: Former Smoker    Packs/day: 3.00    Years: 35.00    Pack years: 105.00    Types: Cigarettes    Quit date: 09/07/1996    Years since quitting: 23.7  . Smokeless tobacco: Never Used  Vaping Use  . Vaping Use: Never used  Substance and Sexual Activity  . Alcohol use: Yes    Alcohol/week: 0.0 - 2.0 standard drinks    Comment: 1-2 beers a couple times a month  . Drug use: No  . Sexual activity: Not on file  Other Topics Concern  . Not on file  Social History Narrative  . Not on file   Social Determinants of Health   Financial Resource Strain: Low Risk   . Difficulty of Paying Living Expenses: Not hard at all  Food Insecurity: No Food Insecurity  . Worried About Charity fundraiser in the Last Year: Never true  . Ran Out of Food in the Last Year: Never true  Transportation Needs: No Transportation Needs  . Lack of Transportation (Medical): No  . Lack of Transportation (Non-Medical): No  Physical Activity: Inactive  . Days of Exercise per Week: 0 days  . Minutes of Exercise per Session: 0 min  Stress: No Stress Concern Present  . Feeling of Stress : Only a little  Social Connections: Socially Integrated  . Frequency of Communication with Friends and Family: More than three times a week  . Frequency of Social Gatherings with Friends and Family: More than three times a week  . Attends Religious Services: More than 4 times per year  . Active Member of Clubs or Organizations: Yes  . Attends Archivist Meetings: 1 to 4 times per year  . Marital Status: Married  Human resources officer Violence: Not At Risk    . Fear of Current or Ex-Partner: No  . Emotionally Abused: No  . Physically Abused: No  . Sexually Abused: No    Review of Systems: See HPI, otherwise negative ROS  Physical Exam: BP 140/76   Pulse 72   Temp (!) 97.3 F (36.3 C) (Temporal)   Resp 16   Ht 5\' 9"  (1.753 m)   Wt 102.5 kg   SpO2 96%   BMI 33.37 kg/m  General:   Alert,  pleasant and cooperative in NAD Head:  Normocephalic and atraumatic. Neck:  Supple; no masses or thyromegaly. Lungs:  Clear throughout to auscultation.    Heart:  Regular rate and rhythm. Abdomen:  Soft, nontender and  nondistended. Normal bowel sounds, without guarding, and without rebound.   Neurologic:  Alert and  oriented x4;  grossly normal neurologically.  Impression/Plan: Leandra Kern is here for an colonoscopy to be performed for histroy of colon polyps that were adenomatous in 2015  Risks, benefits, limitations, and alternatives regarding  colonoscopy have been reviewed with the patient.  Questions have been answered.  All parties agreeable.   Lucilla Lame, MD  06/10/2020, 9:22 AM

## 2020-06-10 NOTE — Anesthesia Preprocedure Evaluation (Deleted)
Anesthesia Evaluation  Patient identified by MRN, date of birth, ID band Patient awake    Reviewed: Allergy & Precautions, H&P , NPO status , Patient's Chart, lab work & pertinent test results  Airway Mallampati: II  TM Distance: >3 FB Neck ROM: full    Dental no notable dental hx.    Pulmonary former smoker,    Pulmonary exam normal breath sounds clear to auscultation       Cardiovascular hypertension, Normal cardiovascular exam Rhythm:regular Rate:Normal     Neuro/Psych    GI/Hepatic   Endo/Other    Renal/GU Renal disease     Musculoskeletal   Abdominal   Peds  Hematology   Anesthesia Other Findings   Reproductive/Obstetrics                             Anesthesia Physical Anesthesia Plan  ASA: III  Anesthesia Plan: MAC   Post-op Pain Management:    Induction:   PONV Risk Score and Plan: 1 and Treatment may vary due to age or medical condition, TIVA and Midazolam  Airway Management Planned:   Additional Equipment:   Intra-op Plan:   Post-operative Plan:   Informed Consent: I have reviewed the patients History and Physical, chart, labs and discussed the procedure including the risks, benefits and alternatives for the proposed anesthesia with the patient or authorized representative who has indicated his/her understanding and acceptance.     Dental Advisory Given  Plan Discussed with: CRNA  Anesthesia Plan Comments:         Anesthesia Quick Evaluation

## 2020-06-10 NOTE — Anesthesia Preprocedure Evaluation (Signed)
Anesthesia Evaluation  Patient identified by MRN, date of birth, ID band Patient awake    Reviewed: Allergy & Precautions, H&P , NPO status , Patient's Chart, lab work & pertinent test results  Airway Mallampati: II  TM Distance: >3 FB Neck ROM: full    Dental no notable dental hx.    Pulmonary former smoker,    Pulmonary exam normal breath sounds clear to auscultation       Cardiovascular hypertension, Normal cardiovascular exam Rhythm:regular Rate:Normal     Neuro/Psych    GI/Hepatic   Endo/Other    Renal/GU Renal disease     Musculoskeletal   Abdominal   Peds  Hematology   Anesthesia Other Findings   Reproductive/Obstetrics                             Anesthesia Physical Anesthesia Plan  ASA: II  Anesthesia Plan: General   Post-op Pain Management:    Induction: Intravenous  PONV Risk Score and Plan: 2 and Treatment may vary due to age or medical condition, Propofol infusion and TIVA  Airway Management Planned: Natural Airway  Additional Equipment:   Intra-op Plan:   Post-operative Plan:   Informed Consent: I have reviewed the patients History and Physical, chart, labs and discussed the procedure including the risks, benefits and alternatives for the proposed anesthesia with the patient or authorized representative who has indicated his/her understanding and acceptance.     Dental Advisory Given  Plan Discussed with: CRNA  Anesthesia Plan Comments:         Anesthesia Quick Evaluation

## 2020-06-10 NOTE — Anesthesia Procedure Notes (Signed)
Performed by: Evangelyne Loja, CRNA Pre-anesthesia Checklist: Patient identified, Emergency Drugs available, Suction available, Timeout performed and Patient being monitored Patient Re-evaluated:Patient Re-evaluated prior to induction Oxygen Delivery Method: Nasal cannula Placement Confirmation: positive ETCO2       

## 2020-06-11 ENCOUNTER — Encounter: Payer: Self-pay | Admitting: Gastroenterology

## 2020-06-11 LAB — SURGICAL PATHOLOGY

## 2020-06-12 ENCOUNTER — Encounter: Payer: Self-pay | Admitting: Gastroenterology

## 2020-06-24 ENCOUNTER — Other Ambulatory Visit: Payer: Self-pay | Admitting: Family Medicine

## 2020-07-04 DIAGNOSIS — R69 Illness, unspecified: Secondary | ICD-10-CM | POA: Diagnosis not present

## 2020-08-23 DIAGNOSIS — R69 Illness, unspecified: Secondary | ICD-10-CM | POA: Diagnosis not present

## 2020-11-01 DIAGNOSIS — H26491 Other secondary cataract, right eye: Secondary | ICD-10-CM | POA: Diagnosis not present

## 2021-05-14 ENCOUNTER — Ambulatory Visit: Payer: Medicare HMO

## 2021-05-21 ENCOUNTER — Telehealth: Payer: Self-pay

## 2021-05-21 ENCOUNTER — Encounter: Payer: Medicare HMO | Admitting: Family Medicine

## 2021-05-21 NOTE — Progress Notes (Deleted)
Annual Wellness Visit     Patient: Stephen Ponce, Male    DOB: [birth year], 75 y.o.   MRN: [id number] Visit Date: 05/21/2021  Today's Provider: Wilhemena Durie, MD   No chief complaint on file.  Subjective    Stephen Ponce is a 75 y.o. male who presents today for his Annual Wellness Visit. He reports consuming a {diet types:17450} diet. {Exercise:19826} He generally feels {well/fairly well/poorly:18703}. He reports sleeping {well/fairly well/poorly:18703}. He {does/does not:200015} have additional problems to discuss today.   HPI    Medications: Outpatient Medications Prior to Visit  Medication Sig   Ascorbic Acid (VITAMIN C) 1000 MG tablet Take 1,000 mg by mouth daily.    Cholecalciferol (VITAMIN D3) 1000 units CAPS Take 1,000 Units by mouth daily.   Cyanocobalamin (VITAMIN B 12 PO) Take 1 tablet by mouth at bedtime.  (Patient not taking: Reported on 05/15/2020)   cyclobenzaprine (FLEXERIL) 5 MG tablet Take 1 tablet (5 mg total) by mouth 3 (three) times daily as needed for muscle spasms. (Patient not taking: Reported on 07/12/2018)   furosemide (LASIX) 20 MG tablet TAKE 1 TABLET BY MOUTH TWICE A DAY (Patient not taking: Reported on 10/26/2018)   Glucos-Chond-Sterol-Fish Oil (GLUCOSAMINE CHONDROITIN PLUS) CAPS Take 1 tablet by mouth daily.    HYDROcodone-acetaminophen (NORCO) 5-325 MG tablet Take 1-2 tablets by mouth every 6 (six) hours as needed. (Patient not taking: Reported on 05/02/2019)   Krill Oil 350 MG CAPS Take 350 mg by mouth daily.    meloxicam (MOBIC) 15 MG tablet Take 1 tablet (15 mg total) by mouth daily.   metoprolol succinate (TOPROL-XL) 25 MG 24 hr tablet TAKE 1/2 TABLET BY MOUTH EVERY DAY   Multiple Vitamin tablet Take 1 tablet by mouth daily.    Pyridoxine HCl (VITAMIN B6) 200 MG TABS Take 200 mg by mouth daily.    Ubiquinol 100 MG CAPS Take 100 mg by mouth daily.    No facility-administered medications prior to visit.    Allergies  Allergen  Reactions   Penicillins Shortness Of Breath, Itching, Swelling and Other (See Comments)    Has patient had a PCN reaction causing immediate rash, facial/tongue/throat swelling, SOB or lightheadedness with hypotension: Yes Has patient had a PCN reaction causing severe rash involving mucus membranes or skin necrosis: No Has patient had a PCN reaction that required hospitalization: No Has patient had a PCN reaction occurring within the last 10 years: No If all of the above answers are "NO", then may proceed with Cephalosporin use.    Gabapentin Swelling    legs    Patient Care Team: Jerrol Banana., MD as PCP - General (Family Medicine) Birder Robson, MD as Referring Physician (Ophthalmology)  Review of Systems  {Labs  Heme  Chem  Endocrine  Serology  Results Review (optional):23779}    Objective    Vitals: There were no vitals taken for this visit. {Show previous vital signs (optional):23777}  Physical Exam ***  Most recent functional status assessment: In your present state of health, do you have any difficulty performing the following activities: 06/10/2020  Hearing? N  Vision? N  Difficulty concentrating or making decisions? N  Walking or climbing stairs? N  Dressing or bathing? N  Some recent data might be hidden   Most recent fall risk assessment: Fall Risk  05/08/2020  Falls in the past year? 1  Number falls in past yr: 0  Comment Tripped over tote and broke nose.  Injury  with Fall? 1  Follow up Falls prevention discussed    Most recent depression screenings: PHQ 2/9 Scores 05/08/2020 10/26/2018  PHQ - 2 Score 0 0   Most recent cognitive screening: No flowsheet data found. Most recent Audit-C alcohol use screening Alcohol Use Disorder Test (AUDIT) 05/08/2020  1. How often do you have a drink containing alcohol? 2  2. How many drinks containing alcohol do you have on a typical day when you are drinking? 0  3. How often do you have six or more drinks  on one occasion? 0  AUDIT-C Score 2  Alcohol Brief Interventions/Follow-up AUDIT Score <7 follow-up not indicated   A score of 3 or more in women, and 4 or more in men indicates increased risk for alcohol abuse, EXCEPT if all of the points are from question 1   No results found for any visits on 05/21/21.  Assessment & Plan     Annual wellness visit done today including the all of the following: Reviewed patient's Family Medical History Reviewed and updated list of patient's medical providers Assessment of cognitive impairment was done Assessed patient's functional ability Established a written schedule for health screening [place] Completed and Reviewed  Exercise Activities and Dietary recommendations  Goals      LIFESTYLE - DECREASE FALLS RISK     Recommend to remove any items from the home that may cause slips or trips.        Immunization History  Administered Date(s) Administered   Influenza, High Dose Seasonal PF 06/28/2018   PFIZER(Purple Top)SARS-COV-2 Vaccination 10/30/2019, 11/20/2019   Pneumococcal Conjugate-13 06/28/2014   Pneumococcal Polysaccharide-23 08/24/2016   Td 05/11/2000   Tdap 12/07/2014   Zoster, Live 12/07/2014    Health Maintenance  Topic Date Due   Zoster Vaccines- Shingrix (1 of 2) Never done   COVID-19 Vaccine (4 - Booster for Pfizer series) 11/15/2020   INFLUENZA VACCINE  04/07/2021   TETANUS/TDAP  12/06/2024   COLONOSCOPY (Pts 45-16yr Insurance coverage will need to be confirmed)  06/11/2027   Hepatitis C Screening  Completed   PNA vac Low Risk Adult  Completed   HPV VACCINES  Aged Out     Discussed health benefits of physical activity, and encouraged him to engage in regular exercise appropriate for his age and condition.    ***  No follow-ups on file.     {provider attestation***:1}   RWilhemena Durie MD  [phone](phone) [phone](fax)  [place]

## 2021-05-21 NOTE — Telephone Encounter (Signed)
Copied from [place] [id number]. Topic: Appointment Scheduling - Scheduling Inquiry for Clinic >> May 21, 2021  3:44 PM Yvette Rack wrote: Reason for CRM: Pt called to reschedule the AW[facility] appt that he missed today. Cb# [phone]

## 2021-06-23 DIAGNOSIS — M1712 Unilateral primary osteoarthritis, left knee: Secondary | ICD-10-CM | POA: Diagnosis not present

## 2021-06-30 DIAGNOSIS — T8484XA Pain due to internal orthopedic prosthetic devices, implants and grafts, initial encounter: Secondary | ICD-10-CM | POA: Diagnosis not present

## 2021-07-02 ENCOUNTER — Other Ambulatory Visit: Payer: Self-pay

## 2021-07-02 ENCOUNTER — Ambulatory Visit (INDEPENDENT_AMBULATORY_CARE_PROVIDER_SITE_OTHER): Payer: Medicare HMO | Admitting: Family Medicine

## 2021-07-02 ENCOUNTER — Encounter: Payer: Self-pay | Admitting: Family Medicine

## 2021-07-02 VITALS — BP 114/75 | HR 75 | Temp 98.2°F | Ht 69.0 in | Wt 226.0 lb

## 2021-07-02 DIAGNOSIS — M25562 Pain in left knee: Secondary | ICD-10-CM | POA: Diagnosis not present

## 2021-07-02 DIAGNOSIS — I10 Essential (primary) hypertension: Secondary | ICD-10-CM | POA: Diagnosis not present

## 2021-07-02 DIAGNOSIS — Z0000 Encounter for general adult medical examination without abnormal findings: Secondary | ICD-10-CM

## 2021-07-02 DIAGNOSIS — G8929 Other chronic pain: Secondary | ICD-10-CM

## 2021-07-02 DIAGNOSIS — E538 Deficiency of other specified B group vitamins: Secondary | ICD-10-CM

## 2021-07-02 NOTE — Progress Notes (Signed)
Annual Wellness Visit     Patient: Stephen Ponce, Male    DOB: [birth year], 75 y.o.   MRN: [id number] Visit Date: 07/02/2021  Today's Provider: Wilhemena Durie, MD   No chief complaint on file.  Subjective    Stephen Ponce is a 75 y.o. male who presents today for his Annual Wellness Visit.Annual physical. He reports consuming a general diet.  He generally feels well. He reports sleeping well. He does not have additional problems to discuss today.   HPI He is scheduled to have a plate removed from his lower left leg in December and then to have the left total knee replacement in January.  Dr. [name]. Patient is having no chest pain dyspnea on exertion or any neurologic symptoms.  Medications: Outpatient Medications Prior to Visit  Medication Sig   Ascorbic Acid (VITAMIN C) 1000 MG tablet Take 1,000 mg by mouth daily.    Cholecalciferol (VITAMIN D3) 1000 units CAPS Take 1,000 Units by mouth daily.   Glucos-Chond-Sterol-Fish Oil (GLUCOSAMINE CHONDROITIN PLUS) CAPS Take 1 tablet by mouth daily.    Krill Oil 350 MG CAPS Take 350 mg by mouth daily.    meloxicam (MOBIC) 15 MG tablet Take 1 tablet (15 mg total) by mouth daily.   metoprolol succinate (TOPROL-XL) 25 MG 24 hr tablet TAKE 1/2 TABLET BY MOUTH EVERY DAY   Multiple Vitamin tablet Take 1 tablet by mouth daily.    Pyridoxine HCl (VITAMIN B6) 200 MG TABS Take 200 mg by mouth daily.    Ubiquinol 100 MG CAPS Take 100 mg by mouth daily.    [DISCONTINUED] Cyanocobalamin (VITAMIN B 12 PO) Take 1 tablet by mouth at bedtime.  (Patient not taking: Reported on 05/15/2020)   [DISCONTINUED] cyclobenzaprine (FLEXERIL) 5 MG tablet Take 1 tablet (5 mg total) by mouth 3 (three) times daily as needed for muscle spasms. (Patient not taking: Reported on 07/12/2018)   [DISCONTINUED] furosemide (LASIX) 20 MG tablet TAKE 1 TABLET BY MOUTH TWICE A DAY (Patient not taking: Reported on 10/26/2018)   [DISCONTINUED] HYDROcodone-acetaminophen (NORCO)  5-325 MG tablet Take 1-2 tablets by mouth every 6 (six) hours as needed. (Patient not taking: Reported on 05/02/2019)   No facility-administered medications prior to visit.    Allergies  Allergen Reactions   Penicillins Shortness Of Breath, Itching, Swelling and Other (See Comments)    Has patient had a PCN reaction causing immediate rash, facial/tongue/throat swelling, SOB or lightheadedness with hypotension: Yes Has patient had a PCN reaction causing severe rash involving mucus membranes or skin necrosis: No Has patient had a PCN reaction that required hospitalization: No Has patient had a PCN reaction occurring within the last 10 years: No If all of the above answers are "NO", then may proceed with Cephalosporin use.    Gabapentin Swelling    legs    Patient Care Team: Jerrol Banana., MD as PCP - General (Family Medicine) Birder Robson, MD as Referring Physician (Ophthalmology)  Review of Systems  All other systems reviewed and are negative.       Objective    Vitals: BP 114/75 (BP Location: Right Arm, Patient Position: Sitting, Cuff Size: Normal)   Pulse 75   Temp 98.2 F (36.8 C) (Oral)   Ht 5\' 9"  (1.753 m)   Wt 226 lb (102.5 kg)   SpO2 96%   BMI 33.37 kg/m     Physical Exam Vitals reviewed.  Constitutional:      Appearance: Normal appearance.  HENT:     Head: Normocephalic and atraumatic.     Right Ear: External ear normal.     Left Ear: External ear normal.  Eyes:     General: No scleral icterus.    Conjunctiva/sclera: Conjunctivae normal.  Cardiovascular:     Rate and Rhythm: Normal rate and regular rhythm.     Pulses: Normal pulses.     Heart sounds: Normal heart sounds.  Pulmonary:     Effort: Pulmonary effort is normal.     Breath sounds: Normal breath sounds.  Abdominal:     Palpations: Abdomen is soft.  Musculoskeletal:     Right lower leg: No edema.     Left lower leg: No edema.  Skin:    General: Skin is warm and dry.   Neurological:     General: No focal deficit present.     Mental Status: He is alert and oriented to person, place, and time.  Psychiatric:        Mood and Affect: Mood normal.        Behavior: Behavior normal.        Thought Content: Thought content normal.        Judgment: Judgment normal.     Most recent functional status assessment: In your present state of health, do you have any difficulty performing the following activities: 07/02/2021  Hearing? Y  Vision? N  Difficulty concentrating or making decisions? Y  Walking or climbing stairs? Y  Dressing or bathing? N  Doing errands, shopping? N  Some recent data might be hidden   Most recent fall risk assessment: Fall Risk  07/02/2021  Falls in the past year? 0  Number falls in past yr: 0  Comment -  Injury with Fall? 0  Follow up -    Most recent depression screenings: PHQ 2/9 Scores 07/02/2021 05/08/2020  PHQ - 2 Score 0 0  PHQ- 9 Score 1 -   Most recent cognitive screening: No flowsheet data found. Most recent Audit-C alcohol use screening Alcohol Use Disorder Test (AUDIT) 07/02/2021  1. How often do you have a drink containing alcohol? 2  2. How many drinks containing alcohol do you have on a typical day when you are drinking? 0  3. How often do you have six or more drinks on one occasion? 0  AUDIT-C Score 2  Alcohol Brief Interventions/Follow-up -   A score of 3 or more in women, and 4 or more in men indicates increased risk for alcohol abuse, EXCEPT if all of the points are from question 1   No results found for any visits on 07/02/21.  Assessment & Plan     Annual wellness visit done today including the all of the following: Reviewed patient's Family Medical History Reviewed and updated list of patient's medical providers Assessment of cognitive impairment was done Assessed patient's functional ability Established a written schedule for health screening [place] Completed and  Reviewed  Exercise Activities and Dietary recommendations  Goals      LIFESTYLE - DECREASE FALLS RISK     Recommend to remove any items from the home that may cause slips or trips.        Immunization History  Administered Date(s) Administered   Influenza, High Dose Seasonal PF 06/28/2018   PFIZER(Purple Top)SARS-COV-2 Vaccination 10/30/2019, 11/20/2019   Pneumococcal Conjugate-13 06/28/2014   Pneumococcal Polysaccharide-23 08/24/2016   Td 05/11/2000   Tdap 12/07/2014   Zoster, Live 12/07/2014    Health Maintenance  Topic Date Due   Zoster Vaccines- Shingrix (1 of 2) Never done   COVID-19 Vaccine (4 - Booster for Pfizer series) 10/18/2020   INFLUENZA VACCINE  04/07/2021   TETANUS/TDAP  12/06/2024   COLONOSCOPY (Pts 45-86yrs Insurance coverage will need to be confirmed)  06/11/2027   Pneumonia Vaccine 51+ Years old  Completed   Hepatitis C Screening  Completed   HPV VACCINES  Aged Out     Discussed health benefits of physical activity, and encouraged him to engage in regular exercise appropriate for his age and condition.    1. Encounter for Medicare annual wellness exam   2. Annual physical exam Follow-up 1 year. Lipids are normal so repeat next year which it would be the 30  3. Essential hypertension Good control on Toprol - TSH - CBC w/Diff/Platelet - Comprehensive Metabolic Panel (CMET)  4. B12 deficiency   5. Chronic pain of left knee For knee replacement early next year   No follow-ups on file.     I, Wilhemena Durie, MD, have reviewed all documentation for this visit. The documentation on 07/03/21 for the exam, diagnosis, procedures, and orders are all accurate and complete.    Khrystal Jeanmarie Cranford Mon, MD  [facility] [phone] (phone) [phone] (fax)  [place]

## 2021-07-03 LAB — CBC WITH DIFFERENTIAL/PLATELET
Basophils Absolute: 0.1 10*3/uL (ref 0.0–0.2)
Basos: 1 %
EOS (ABSOLUTE): 0.3 10*3/uL (ref 0.0–0.4)
Eos: 4 %
Hematocrit: 47.9 % (ref 37.5–51.0)
Hemoglobin: 16.5 g/dL (ref 13.0–17.7)
Immature Grans (Abs): 0 10*3/uL (ref 0.0–0.1)
Immature Granulocytes: 0 %
Lymphocytes Absolute: 1.5 10*3/uL (ref 0.7–3.1)
Lymphs: 20 %
MCH: 31.2 pg (ref 26.6–33.0)
MCHC: 34.4 g/dL (ref 31.5–35.7)
MCV: 91 fL (ref 79–97)
Monocytes Absolute: 0.8 10*3/uL (ref 0.1–0.9)
Monocytes: 10 %
Neutrophils Absolute: 4.9 10*3/uL (ref 1.4–7.0)
Neutrophils: 65 %
Platelets: 234 10*3/uL (ref 150–450)
RBC: 5.29 x10E6/uL (ref 4.14–5.80)
RDW: 12.8 % (ref 11.6–15.4)
WBC: 7.6 10*3/uL (ref 3.4–10.8)

## 2021-07-03 LAB — COMPREHENSIVE METABOLIC PANEL
ALT: 38 IU/L (ref 0–44)
AST: 28 IU/L (ref 0–40)
Albumin/Globulin Ratio: 1.6 (ref 1.2–2.2)
Albumin: 4.5 g/dL (ref 3.7–4.7)
Alkaline Phosphatase: 62 IU/L (ref 44–121)
BUN/Creatinine Ratio: 22 (ref 10–24)
BUN: 20 mg/dL (ref 8–27)
Bilirubin Total: 0.6 mg/dL (ref 0.0–1.2)
CO2: 21 mmol/L (ref 20–29)
Calcium: 9.9 mg/dL (ref 8.6–10.2)
Chloride: 106 mmol/L (ref 96–106)
Creatinine, Ser: 0.93 mg/dL (ref 0.76–1.27)
Globulin, Total: 2.8 g/dL (ref 1.5–4.5)
Glucose: 100 mg/dL — ABNORMAL HIGH (ref 70–99)
Potassium: 4.5 mmol/L (ref 3.5–5.2)
Sodium: 142 mmol/L (ref 134–144)
Total Protein: 7.3 g/dL (ref 6.0–8.5)
eGFR: 86 mL/min/{1.73_m2} (ref >59)

## 2021-07-03 LAB — TSH: TSH: 1.2 u[IU]/mL (ref 0.450–4.500)

## 2021-07-09 ENCOUNTER — Encounter: Payer: Self-pay | Admitting: Orthopedic Surgery

## 2021-07-09 ENCOUNTER — Other Ambulatory Visit: Payer: Self-pay | Admitting: Orthopedic Surgery

## 2021-07-09 DIAGNOSIS — Z01818 Encounter for other preprocedural examination: Secondary | ICD-10-CM

## 2021-07-09 NOTE — H&P (Signed)
NAME: Stephen Ponce MRN:   [id number] DOB:   [birth year]     HISTORY AND PHYSICAL  CHIEF COMPLAINT:  left hip pain  HISTORY:   Stephen Luu Beckeris a 76 y.o. male  with left  Hip Pain Patient complains of left hip pain. Onset of the symptoms was several months ago. Inciting event: none. The patient reports the hip pain is worse with weight bearing. Associated symptoms: none, injury. Aggravating symptoms include: any weight bearing. Patient has had prior hip problems. Previous visits for this problem: multiple, this is a longstanding diagnosis. Last seen several weeks ago by Dr. [name] . Evaluation to date: plain films, which were abnormal  hip hardware . Plan for left hip hardware removal.  PAST MEDICAL HISTORY:   Past Medical History:  Diagnosis Date   Actinic keratosis    Arthritis    Basal cell carcinoma 10/14/2016   Left top of shoulder. 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 SCCis    PAST SURGICAL HISTORY:   Past Surgical History:  Procedure Laterality Date   CARPAL TUNNEL RELEASE Left 04/10/2019   Procedure: CARPAL TUNNEL RELEASE;  Surgeon: Earnestine Leys, MD;  Location: [facility];  Service: Orthopedics;  Laterality: Left;   CATARACT EXTRACTION W/PHACO Right 02/13/2020   Procedure: CATARACT EXTRACTION PHACO AND INTRAOCULAR LENS PLACEMENT (IOC) RIGHT 4.48  00:31.2;  Surgeon: Birder Robson, MD;  Location: [place];  Service: Ophthalmology;  Laterality: Right;   CATARACT EXTRACTION W/PHACO Left 04/01/2020   Procedure: CATARACT EXTRACTION PHACO AND INTRAOCULAR LENS PLACEMENT (IOC) LEFT 6.87 00:38.9 ;  Surgeon: Birder Robson,  MD;  Location: [place];  Service: Ophthalmology;  Laterality: Left;   COLONOSCOPY  08/09/2014   tubular adenoma   COLONOSCOPY WITH PROPOFOL N/A 06/10/2020   Procedure: COLONOSCOPY WITH BIOPSY;  Surgeon: Lucilla Lame, MD;  Location: [place];  Service: Endoscopy;  Laterality: N/A;  priority 4   OSTEOTOMY     POLYPECTOMY N/A 06/10/2020   Procedure: POLYPECTOMY;  Surgeon: Lucilla Lame, MD;  Location: [place];  Service: Endoscopy;  Laterality: N/A;   SHOULDER ARTHROSCOPY WITH OPEN ROTATOR CUFF REPAIR Left 07/18/2018   Procedure: SHOULDER ARTHROSCOPY WITH OPEN ROTATOR CUFF REPAIR;  Surgeon: Earnestine Leys, MD;  Location: [facility];  Service: Orthopedics;  Laterality: Left;    MEDICATIONS:  (Not in a hospital admission)   ALLERGIES:   Allergies  Allergen Reactions   Penicillins Shortness Of Breath, Itching, Swelling and Other (See Comments)    Has patient had a PCN reaction causing immediate rash, facial/tongue/throat swelling, SOB or lightheadedness with hypotension: Yes Has patient had a PCN reaction causing severe rash involving mucus membranes or skin necrosis: No Has patient had a PCN reaction that required hospitalization: No Has patient had a PCN reaction occurring within the last 10 years: No If all of the above answers are "NO", then may proceed with Cephalosporin use.    Gabapentin Swelling    legs    REVIEW OF SYSTEMS:   Negative except HPI  FAMILY HISTORY:   Family History  Problem  Relation Age of Onset   CAD Father    Stomach cancer Brother    Lung cancer Brother    Bone cancer Brother     SOCIAL HISTORY:   reports that he quit smoking about 24 years ago. His smoking use included cigarettes. He has a 105.00 pack-year smoking history. He has never used smokeless tobacco. He reports current alcohol use. He reports that he does not use drugs.  PHYSICAL EXAM:  General appearance: alert, cooperative, and no distress Neck: no JVD and supple,  symmetrical, trachea midline Resp: clear to auscultation bilaterally Cardio: regular rate and rhythm, S1, S2 normal, no murmur, click, rub or gallop GI: soft, non-tender; bowel sounds normal; no masses,  no organomegaly Extremities: extremities normal, atraumatic, no cyanosis or edema and Homans sign is negative, no sign of DVT Pulses: 2+ and symmetric Skin: Skin color, texture, turgor normal. No rashes or lesions Neurologic: Alert and oriented X 3, normal strength and tone. Normal symmetric reflexes. Normal coordination and gait    LABORATORY STUDIES: No results for input(s): WBC, HGB, HCT, PLT in the last 72 hours.  No results for input(s): NA, K, CL, CO2, GLUCOSE, BUN, CREATININE, CALCIUM in the last 72 hours.  STUDIES/RESULTS:  No results found.  ASSESSMENT: left hip painful hardware        Active Problems:   * No active hospital problems. *    PLAN: left hip hardware removal   Carlynn Spry 07/09/2021. 12:38 PM

## 2021-07-11 ENCOUNTER — Other Ambulatory Visit: Payer: Self-pay | Admitting: Orthopedic Surgery

## 2021-07-11 ENCOUNTER — Other Ambulatory Visit: Payer: Self-pay

## 2021-07-11 ENCOUNTER — Encounter
Admission: RE | Admit: 2021-07-11 | Discharge: 2021-07-11 | Disposition: A | Discharge status: Home / Self Care | Payer: Medicare HMO | Source: Ambulatory Visit | Attending: Orthopedic Surgery | Admitting: Orthopedic Surgery

## 2021-07-11 HISTORY — DX: Personal history of urinary calculi: Z87.442

## 2021-07-11 NOTE — Patient Instructions (Signed)
Your procedure is scheduled on: 07/21/21 Report to [place]. To find out your arrival time please call [phone] between 1PM - 3PM on 07/18/21.  Remember: Instructions that are not followed completely may result in serious medical risk, up to and including death, or upon the discretion of your surgeon and anesthesiologist your surgery may need to be rescheduled.     _X__ 1. Do not eat food or drink any liquids after midnight the night before your procedure.                 No gum chewing or hard candies.   __X__2.  On the morning of surgery brush your teeth with toothpaste and water, you                 may rinse your mouth with mouthwash if you wish.  Do not swallow any              toothpaste of mouthwash.     _X__ 3.  No Alcohol for 24 hours before or after surgery.   _X__ 4.  Do Not Smoke or use e-cigarettes For 24 Hours Prior to Your Surgery.                 Do not use any chewable tobacco products for at least 6 hours prior to                 surgery.  ____  5.  Bring all medications with you on the day of surgery if instructed.   __X__  6.  Notify your doctor if there is any change in your medical condition      (cold, fever, infections).     Do not wear jewelry, make-up, hairpins, clips or nail polish. Do not wear lotions, powders, or perfumes.  Do not shave body hair 48 hours prior to surgery. Men may shave face and neck. Do not bring valuables to the hospital.    [facility] is not responsible for any belongings or valuables.  Contacts, dentures/partials or body piercings may not be worn into surgery. Bring a case for your contacts, glasses or hearing aids, a denture cup will be supplied. Leave your suitcase in the car. After surgery it may be brought to your room. For patients admitted to the hospital, discharge time is determined by your treatment team.   Patients discharged the day of surgery will not be  allowed to drive home.   Please read over the following fact sheets that you were given:   CHG soap, Incentive spirometer  __X__ Take these medicines the morning of surgery with A SIP OF WATER:    1. metoprolol succinate (TOPROL-XL) 25 MG 24 hr tablet  2.   3.   4.  5.  6.  ____ Fleet Enema (as directed)   __X__ Use CHG Soap/SAGE wipes as directed  ____ Use inhalers on the day of surgery  ____ Stop metformin/Janumet/Farxiga 2 days prior to surgery    ____ Take 1/2 of usual insulin dose the night before surgery. No insulin the morning          of surgery.   ____ Stop Blood Thinners Coumadin/Plavix/Xarelto/Pleta/Pradaxa/Eliquis/Effient/Aspirin  on   Or contact your Surgeon, Cardiologist or Medical Doctor regarding  ability to stop your blood thinners  __X__ Stop Anti-inflammatories 7 days before surgery such as Advil, Ibuprofen, Motrin,  BC or Goodies Powder, Naprosyn, Naproxen, Aleve, Aspirin  __X__ Stop all herbals and supplements, fish oil and vitamins  until after surgery.    ____ Bring C-Pap to the hospital.

## 2021-07-14 ENCOUNTER — Encounter: Payer: Self-pay | Admitting: Urgent Care

## 2021-07-14 ENCOUNTER — Other Ambulatory Visit: Payer: Medicare HMO

## 2021-07-15 ENCOUNTER — Other Ambulatory Visit
Admission: RE | Admit: 2021-07-15 | Discharge: 2021-07-15 | Disposition: A | Discharge status: Home / Self Care | Payer: Medicare HMO | Source: Ambulatory Visit | Attending: Orthopedic Surgery | Admitting: Orthopedic Surgery

## 2021-07-15 ENCOUNTER — Other Ambulatory Visit: Payer: Self-pay | Admitting: Orthopedic Surgery

## 2021-07-15 ENCOUNTER — Other Ambulatory Visit: Payer: Self-pay

## 2021-07-15 DIAGNOSIS — Z01810 Encounter for preprocedural cardiovascular examination: Secondary | ICD-10-CM | POA: Diagnosis not present

## 2021-07-15 DIAGNOSIS — L255 Unspecified contact dermatitis due to plants, except food: Secondary | ICD-10-CM | POA: Diagnosis not present

## 2021-07-15 DIAGNOSIS — Z01818 Encounter for other preprocedural examination: Secondary | ICD-10-CM | POA: Insufficient documentation

## 2021-07-15 LAB — URINALYSIS, ROUTINE W REFLEX MICROSCOPIC
Bilirubin Urine: NEGATIVE
Glucose, UA: NEGATIVE mg/dL
Hgb urine dipstick: NEGATIVE
Ketones, ur: NEGATIVE mg/dL
Leukocytes,Ua: NEGATIVE
Nitrite: NEGATIVE
Protein, ur: NEGATIVE mg/dL
Specific Gravity, Urine: 1.017 (ref 1.005–1.030)
pH: 5 (ref 5.0–8.0)

## 2021-07-15 LAB — PROTIME-INR
INR: 1.1 (ref 0.8–1.2)
Prothrombin Time: 13.8 seconds (ref 11.4–15.2)

## 2021-07-15 LAB — APTT: aPTT: 30 seconds (ref 24–36)

## 2021-07-21 ENCOUNTER — Ambulatory Visit: Admission: RE | Admit: 2021-07-21 | Payer: Medicare HMO | Source: Ambulatory Visit | Admitting: Orthopedic Surgery

## 2021-07-21 ENCOUNTER — Encounter: Admission: RE | Payer: Self-pay | Source: Ambulatory Visit

## 2021-07-21 SURGERY — REMOVAL, HARDWARE
Anesthesia: General | Laterality: Left

## 2021-07-22 DIAGNOSIS — L255 Unspecified contact dermatitis due to plants, except food: Secondary | ICD-10-CM | POA: Diagnosis not present

## 2021-08-07 DIAGNOSIS — M1990 Unspecified osteoarthritis, unspecified site: Secondary | ICD-10-CM | POA: Diagnosis not present

## 2021-08-07 DIAGNOSIS — Z888 Allergy status to other drugs, medicaments and biological substances status: Secondary | ICD-10-CM | POA: Diagnosis not present

## 2021-08-07 DIAGNOSIS — E669 Obesity, unspecified: Secondary | ICD-10-CM | POA: Diagnosis not present

## 2021-08-07 DIAGNOSIS — N529 Male erectile dysfunction, unspecified: Secondary | ICD-10-CM | POA: Diagnosis not present

## 2021-08-07 DIAGNOSIS — Z880 Allergy status to penicillin: Secondary | ICD-10-CM | POA: Diagnosis not present

## 2021-08-07 DIAGNOSIS — Z87891 Personal history of nicotine dependence: Secondary | ICD-10-CM | POA: Diagnosis not present

## 2021-08-07 DIAGNOSIS — Z809 Family history of malignant neoplasm, unspecified: Secondary | ICD-10-CM | POA: Diagnosis not present

## 2021-08-07 DIAGNOSIS — Z6832 Body mass index (BMI) 32.0-32.9, adult: Secondary | ICD-10-CM | POA: Diagnosis not present

## 2021-08-07 DIAGNOSIS — Z008 Encounter for other general examination: Secondary | ICD-10-CM | POA: Diagnosis not present

## 2021-08-07 DIAGNOSIS — Z85828 Personal history of other malignant neoplasm of skin: Secondary | ICD-10-CM | POA: Diagnosis not present

## 2021-08-07 DIAGNOSIS — I10 Essential (primary) hypertension: Secondary | ICD-10-CM | POA: Diagnosis not present

## 2021-09-09 ENCOUNTER — Other Ambulatory Visit: Payer: Self-pay | Admitting: Orthopedic Surgery

## 2021-09-09 ENCOUNTER — Encounter: Payer: Self-pay | Admitting: Orthopedic Surgery

## 2021-09-09 ENCOUNTER — Other Ambulatory Visit: Payer: Self-pay

## 2021-09-09 ENCOUNTER — Encounter
Admission: RE | Admit: 2021-09-09 | Discharge: 2021-09-09 | Disposition: A | Discharge status: Home / Self Care | Payer: Medicare HMO | Source: Ambulatory Visit | Attending: Orthopedic Surgery | Admitting: Orthopedic Surgery

## 2021-09-09 VITALS — BP 141/71 | HR 65 | Temp 98.1°F | Resp 18 | Ht 69.0 in | Wt 228.8 lb

## 2021-09-09 DIAGNOSIS — Z01810 Encounter for preprocedural cardiovascular examination: Secondary | ICD-10-CM | POA: Diagnosis not present

## 2021-09-09 DIAGNOSIS — Z86718 Personal history of other venous thrombosis and embolism: Secondary | ICD-10-CM | POA: Insufficient documentation

## 2021-09-09 DIAGNOSIS — Z01812 Encounter for preprocedural laboratory examination: Secondary | ICD-10-CM

## 2021-09-09 DIAGNOSIS — Z01818 Encounter for other preprocedural examination: Secondary | ICD-10-CM | POA: Insufficient documentation

## 2021-09-09 LAB — CBC
HCT: 47.9 % (ref 39.0–52.0)
Hemoglobin: 16.3 g/dL (ref 13.0–17.0)
MCH: 30.9 pg (ref 26.0–34.0)
MCHC: 34 g/dL (ref 30.0–36.0)
MCV: 90.7 fL (ref 80.0–100.0)
Platelets: 196 10*3/uL (ref 150–400)
RBC: 5.28 MIL/uL (ref 4.22–5.81)
RDW: 13 % (ref 11.5–15.5)
WBC: 6.7 10*3/uL (ref 4.0–10.5)
nRBC: 0 % (ref 0.0–0.2)

## 2021-09-09 LAB — APTT: aPTT: 30 seconds (ref 24–36)

## 2021-09-09 LAB — BASIC METABOLIC PANEL
Anion gap: 7 (ref 5–15)
BUN: 18 mg/dL (ref 8–23)
CO2: 24 mmol/L (ref 22–32)
Calcium: 9.3 mg/dL (ref 8.9–10.3)
Chloride: 107 mmol/L (ref 98–111)
Creatinine, Ser: 0.56 mg/dL — ABNORMAL LOW (ref 0.61–1.24)
GFR, Estimated: >60 mL/min (ref >60)
Glucose, Bld: 85 mg/dL (ref 70–99)
Potassium: 4.4 mmol/L (ref 3.5–5.1)
Sodium: 138 mmol/L (ref 135–145)

## 2021-09-09 LAB — URINALYSIS, ROUTINE W REFLEX MICROSCOPIC
Bilirubin Urine: NEGATIVE
Glucose, UA: NEGATIVE mg/dL
Hgb urine dipstick: NEGATIVE
Ketones, ur: NEGATIVE mg/dL
Leukocytes,Ua: NEGATIVE
Nitrite: NEGATIVE
Protein, ur: NEGATIVE mg/dL
Specific Gravity, Urine: 1.021 (ref 1.005–1.030)
pH: 5 (ref 5.0–8.0)

## 2021-09-09 LAB — PROTIME-INR
INR: 1.3 — ABNORMAL HIGH (ref 0.8–1.2)
Prothrombin Time: 15.8 seconds — ABNORMAL HIGH (ref 11.4–15.2)

## 2021-09-09 NOTE — Patient Instructions (Addendum)
Your procedure is scheduled on: Wednesday September 17, 2021 Report to Day Surgery inside [facility] 2nd floor. To find out your arrival time please call [phone] between 1PM - 3PM on Tuesday September 16, 2021.  Remember: Instructions that are not followed completely may result in serious medical risk,  up to and including death, or upon the discretion of your surgeon and anesthesiologist your  surgery may need to be rescheduled.     _X__ 1. Do not eat food or drink fluids after midnight the night before your procedure.                 No chewing gum or hard candies.   __X__2.  On the morning of surgery brush your teeth with toothpaste and water, you                may rinse your mouth with mouthwash if you wish.  Do not swallow any toothpaste of mouthwash.     _X__ 3.  No Alcohol for 24 hours before or after surgery.   _X__ 4.  Do Not Smoke or use e-cigarettes For 24 Hours Prior to Your Surgery.                 Do not use any chewable tobacco products for at least 6 hours prior to                 Surgery.  _X__  5.  Do not use any recreational drugs (marijuana, cocaine, heroin, ecstasy, MDMA or other)                For at least one week prior to your surgery.  Combination of these drugs with anesthesia                May have life threatening results.  __X__ 6.  Notify your doctor if there is any change in your medical condition      (cold, fever, infections).     Do not wear jewelry, make-up, hairpins, clips or nail polish. Do not wear lotions, powders, or perfumes. You may wear deodorant. Do not shave 48 hours prior to surgery. Men may shave face and neck. Do not bring valuables to the hospital.    [facility] is not responsible for any belongings or valuables.  Contacts, dentures or bridgework may not be worn into surgery. Leave your suitcase in the car. After surgery it may be brought to your room. For patients admitted to the hospital, discharge  time is determined by your treatment team.   Patients discharged the day of surgery will not be allowed to drive home.   Make arrangements for someone to be with you for the first 24 hours of your Same Day Discharge.   __X__ Take these medicines the morning of surgery with A SIP OF WATER:    1. metoprolol succinate (TOPROL-XL) 25 MG   2.   3.   4.  5.  6.  ____ Fleet Enema (as directed)   __X__ Use CHG Soap (or wipes) as directed  ____ Use Benzoyl Peroxide Gel as instructed  ____ Use inhalers on the day of surgery  ____ Stop metformin 2 days prior to surgery    ____ Take 1/2 of usual insulin dose the night before surgery. No insulin the morning          of surgery.   ____ Call your PCP, cardiologist, or Pulmonologist if taking Coumadin/Plavix/aspirin and ask when to stop before  your surgery.   __X__ One Week prior to surgery- Stop Anti-inflammatories such as Ibuprofen, Aleve, Advil, Motrin, meloxicam (MOBIC), diclofenac, etodolac, ketorolac, Toradol, Daypro, piroxicam, Goody's or BC powders. OK TO USE TYLENOL IF NEEDED   __X__ Stop ALL supplements until after surgery.    ____ Bring C-Pap to the hospital.    If you have any questions regarding your pre-procedure instructions,  Please call Pre-admit Testing at [phone]

## 2021-09-09 NOTE — H&P (Deleted)
  The note originally documented on this encounter has been moved the the encounter in which it belongs.  

## 2021-09-09 NOTE — H&P (Signed)
FREDICK SCHLOSSER MRN:  [id number] DOB/SEX:  Jan 13, 1946/male  CHIEF COMPLAINT:  Painful left Knee  HISTORY: Patient is a 76 y.o. male presented with a history of pain in the left knee. Onset of symptoms was gradual starting several years ago with gradually worsening course since that time. Prior procedures on the knee include ORIF. Patient has been treated conservatively with over-the-counter NSAIDs and activity modification. Patient currently rates pain in the knee at 8 out of 10 with activity. There is no pain at night.  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 SCCis   Past Surgical History:  Procedure Laterality Date   CARPAL TUNNEL RELEASE Left 04/10/2019   Procedure: CARPAL TUNNEL RELEASE;  Surgeon: Earnestine Leys, MD;  Location: [facility];  Service: Orthopedics;  Laterality: Left;   CATARACT EXTRACTION W/PHACO Right 02/13/2020   Procedure: CATARACT EXTRACTION PHACO AND INTRAOCULAR LENS PLACEMENT (IOC) RIGHT 4.48  00:31.2;  Surgeon: Birder Robson, MD;  Location: [place];  Service: Ophthalmology;  Laterality: Right;    CATARACT EXTRACTION W/PHACO Left 04/01/2020   Procedure: CATARACT EXTRACTION PHACO AND INTRAOCULAR LENS PLACEMENT (IOC) LEFT 6.87 00:38.9 ;  Surgeon: Birder Robson, MD;  Location: [place];  Service: Ophthalmology;  Laterality: Left;   COLONOSCOPY  08/09/2014   tubular adenoma   COLONOSCOPY WITH PROPOFOL N/A 06/10/2020   Procedure: COLONOSCOPY WITH BIOPSY;  Surgeon: Lucilla Lame, MD;  Location: [place];  Service: Endoscopy;  Laterality: N/A;  priority 4   JOINT REPLACEMENT     OSTEOTOMY     POLYPECTOMY N/A 06/10/2020   Procedure: POLYPECTOMY;  Surgeon: Lucilla Lame, MD;  Location: [place];  Service: Endoscopy;  Laterality: N/A;   SHOULDER ARTHROSCOPY WITH OPEN ROTATOR CUFF REPAIR Left 07/18/2018   Procedure: SHOULDER ARTHROSCOPY WITH OPEN ROTATOR CUFF REPAIR;  Surgeon: Earnestine Leys, MD;  Location: [facility];  Service: Orthopedics;  Laterality: Left;     MEDICATIONS:  (Not in a hospital admission)   ALLERGIES:   Allergies  Allergen Reactions   Penicillins Shortness Of Breath, Itching, Swelling and Other (See Comments)    Has patient had a PCN reaction causing immediate rash, facial/tongue/throat swelling, SOB or lightheadedness with hypotension: Yes Has patient had a PCN reaction causing severe rash involving mucus membranes or skin necrosis: No Has patient had a PCN reaction that required hospitalization: No Has patient had a PCN reaction occurring within the last 10 years: No If all of the above answers are "NO", then may proceed with Cephalosporin use.  Gabapentin Swelling    legs    REVIEW OF SYSTEMS:  Pertinent items are noted in HPI.   FAMILY HISTORY:   Family History  Problem Relation Age of Onset   CAD Father    Stomach cancer Brother    Lung cancer Brother    Bone cancer Brother     SOCIAL HISTORY:   Social History   Tobacco Use   Smoking status: Former    Packs/day: 3.00    Years: 35.00    Pack years: 105.00     Types: Cigarettes    Quit date: 09/07/1996    Years since quitting: 25.0   Smokeless tobacco: Never  Substance Use Topics   Alcohol use: Yes    Alcohol/week: 0.0 - 2.0 standard drinks    Comment: 1-2 beers a couple times a month     EXAMINATION:  Vital signs in last 24 hours: @VSRANGES @  General appearance: alert, cooperative, and no distress Back: symmetric, no curvature. ROM normal. No CVA tenderness. Lungs: clear to auscultation bilaterally Heart: regular rate and rhythm, S1, S2 normal, no murmur, click, rub or gallop Abdomen: soft, non-tender; bowel sounds normal; no masses,  no organomegaly Extremities: extremities normal, atraumatic, no cyanosis or edema and Homans sign is negative, no sign of DVT Pulses: 2+ and symmetric Skin: Skin color, texture, turgor normal. No rashes or lesions Neurologic: Alert and oriented X 3, normal strength and tone. Normal symmetric reflexes. Normal coordination and gait  Musculoskeletal:  ROM 0-110, Ligaments intact,  Imaging Review Plain radiographs demonstrate hardware in good alignment  Assessment/Plan:  Left knee painful hardware  Left kneehardware removal   Carlynn Spry 09/09/2021, 10:56 AM

## 2021-09-17 ENCOUNTER — Encounter: Payer: Self-pay | Admitting: Orthopedic Surgery

## 2021-09-17 ENCOUNTER — Ambulatory Visit: Payer: Medicare HMO | Admitting: Anesthesiology

## 2021-09-17 ENCOUNTER — Encounter
Admission: RE | Disposition: A | Discharge status: Home / Self Care | Payer: Self-pay | Source: Home / Self Care | Attending: Orthopedic Surgery

## 2021-09-17 ENCOUNTER — Other Ambulatory Visit: Payer: Self-pay

## 2021-09-17 ENCOUNTER — Ambulatory Visit
Admission: RE | Admit: 2021-09-17 | Discharge: 2021-09-17 | Disposition: A | Discharge status: Home / Self Care | Payer: Medicare HMO | Attending: Orthopedic Surgery | Admitting: Orthopedic Surgery

## 2021-09-17 ENCOUNTER — Ambulatory Visit: Payer: Medicare HMO

## 2021-09-17 DIAGNOSIS — Z472 Encounter for removal of internal fixation device: Secondary | ICD-10-CM | POA: Diagnosis not present

## 2021-09-17 DIAGNOSIS — Z87891 Personal history of nicotine dependence: Secondary | ICD-10-CM | POA: Diagnosis not present

## 2021-09-17 DIAGNOSIS — I10 Essential (primary) hypertension: Secondary | ICD-10-CM | POA: Diagnosis not present

## 2021-09-17 DIAGNOSIS — Y831 Surgical operation with implant of artificial internal device as the cause of abnormal reaction of the patient, or of later complication, without mention of misadventure at the time of the procedure: Secondary | ICD-10-CM | POA: Insufficient documentation

## 2021-09-17 DIAGNOSIS — Z79899 Other long term (current) drug therapy: Secondary | ICD-10-CM | POA: Insufficient documentation

## 2021-09-17 DIAGNOSIS — T8484XA Pain due to internal orthopedic prosthetic devices, implants and grafts, initial encounter: Secondary | ICD-10-CM | POA: Diagnosis not present

## 2021-09-17 HISTORY — PX: HARDWARE REMOVAL: SHX979

## 2021-09-17 SURGERY — REMOVAL, HARDWARE
Anesthesia: General | Laterality: Left

## 2021-09-17 MED ORDER — BUPIVACAINE-EPINEPHRINE (PF) 0.25% -1:200000 IJ SOLN
INTRAMUSCULAR | Status: AC
  Filled 2021-09-17: qty 30

## 2021-09-17 MED ORDER — EPHEDRINE 5 MG/ML INJ
INTRAVENOUS | Status: AC
  Filled 2021-09-17: qty 5

## 2021-09-17 MED ORDER — CLINDAMYCIN PHOSPHATE 900 MG/50ML IV SOLN
900.0000 mg | INTRAVENOUS | Status: AC
  Administered 2021-09-17: 900 mg via INTRAVENOUS

## 2021-09-17 MED ORDER — PROPOFOL 10 MG/ML IV BOLUS
INTRAVENOUS | Status: DC | PRN
  Administered 2021-09-17: 150 mg via INTRAVENOUS

## 2021-09-17 MED ORDER — BUPIVACAINE HCL (PF) 0.5 % IJ SOLN
INTRAMUSCULAR | Status: AC
  Filled 2021-09-17: qty 10

## 2021-09-17 MED ORDER — ONDANSETRON HCL 4 MG/2ML IJ SOLN
INTRAMUSCULAR | Status: DC | PRN
  Administered 2021-09-17: 4 mg via INTRAVENOUS

## 2021-09-17 MED ORDER — FAMOTIDINE 20 MG PO TABS
ORAL_TABLET | ORAL | Status: AC
  Administered 2021-09-17: 20 mg via ORAL
  Filled 2021-09-17: qty 1

## 2021-09-17 MED ORDER — BUPIVACAINE-EPINEPHRINE 0.25% -1:200000 IJ SOLN
INTRAMUSCULAR | Status: DC | PRN
Start: 2021-09-17 — End: 2021-09-17
  Administered 2021-09-17: 10 mL

## 2021-09-17 MED ORDER — FAMOTIDINE 20 MG PO TABS: 20.0000 mg | ORAL_TABLET | Freq: Once | ORAL | Status: AC

## 2021-09-17 MED ORDER — ACETAMINOPHEN 10 MG/ML IV SOLN
INTRAVENOUS | Status: DC | PRN
  Administered 2021-09-17: 1000 mg via INTRAVENOUS

## 2021-09-17 MED ORDER — FENTANYL CITRATE (PF) 100 MCG/2ML IJ SOLN
INTRAMUSCULAR | Status: DC | PRN
Start: 2021-09-17 — End: 2021-09-17
  Administered 2021-09-17 (×3): 25 ug via INTRAVENOUS

## 2021-09-17 MED ORDER — KETOROLAC TROMETHAMINE 30 MG/ML IJ SOLN
INTRAMUSCULAR | Status: AC
  Filled 2021-09-17: qty 1

## 2021-09-17 MED ORDER — LIDOCAINE HCL (CARDIAC) PF 100 MG/5ML IV SOSY
PREFILLED_SYRINGE | INTRAVENOUS | Status: DC | PRN
  Administered 2021-09-17: 60 mg via INTRAVENOUS

## 2021-09-17 MED ORDER — CHLORHEXIDINE GLUCONATE 0.12 % MT SOLN: 15.0000 mL | Freq: Once | OROMUCOSAL | Status: AC

## 2021-09-17 MED ORDER — CHLORHEXIDINE GLUCONATE 0.12 % MT SOLN
OROMUCOSAL | Status: AC
  Administered 2021-09-17: 15 mL via OROMUCOSAL
  Filled 2021-09-17: qty 15

## 2021-09-17 MED ORDER — LACTATED RINGERS IV SOLN: INTRAVENOUS | Status: DC

## 2021-09-17 MED ORDER — ONDANSETRON HCL 4 MG/2ML IJ SOLN: 4.0000 mg | Freq: Once | INTRAMUSCULAR | Status: DC | PRN

## 2021-09-17 MED ORDER — FENTANYL CITRATE (PF) 100 MCG/2ML IJ SOLN
INTRAMUSCULAR | Status: AC
  Filled 2021-09-17: qty 2

## 2021-09-17 MED ORDER — PROPOFOL 1000 MG/100ML IV EMUL
INTRAVENOUS | Status: AC
  Filled 2021-09-17: qty 100

## 2021-09-17 MED ORDER — OXYCODONE HCL 5 MG PO TABS
5.0000 mg | ORAL_TABLET | Freq: Once | ORAL | Status: AC
  Administered 2021-09-17: 5 mg via ORAL

## 2021-09-17 MED ORDER — PHENYLEPHRINE HCL-NACL 20-0.9 MG/250ML-% IV SOLN
INTRAVENOUS | Status: AC
  Filled 2021-09-17: qty 250

## 2021-09-17 MED ORDER — HYDROCODONE-ACETAMINOPHEN 5-325 MG PO TABS: 1.0000 | ORAL_TABLET | ORAL | 0 refills | Status: DC | PRN

## 2021-09-17 MED ORDER — CLINDAMYCIN PHOSPHATE 900 MG/50ML IV SOLN
INTRAVENOUS | Status: AC
  Filled 2021-09-17: qty 50

## 2021-09-17 MED ORDER — DOCUSATE SODIUM 100 MG PO CAPS: 100.0000 mg | ORAL_CAPSULE | Freq: Every day | ORAL | 2 refills | Status: AC | PRN

## 2021-09-17 MED ORDER — EPHEDRINE SULFATE 50 MG/ML IJ SOLN
INTRAMUSCULAR | Status: DC | PRN
  Administered 2021-09-17: 10 mg via INTRAVENOUS
  Administered 2021-09-17 (×3): 5 mg via INTRAVENOUS

## 2021-09-17 MED ORDER — FENTANYL CITRATE (PF) 100 MCG/2ML IJ SOLN: 25.0000 ug | INTRAMUSCULAR | Status: DC | PRN

## 2021-09-17 MED ORDER — SODIUM CHLORIDE 0.9 % IV SOLN: INTRAVENOUS | Status: DC

## 2021-09-17 MED ORDER — ORAL CARE MOUTH RINSE: 15.0000 mL | Freq: Once | OROMUCOSAL | Status: AC

## 2021-09-17 MED ORDER — OXYCODONE HCL 5 MG PO TABS
ORAL_TABLET | ORAL | Status: AC
  Filled 2021-09-17: qty 1

## 2021-09-17 MED ORDER — ACETAMINOPHEN 10 MG/ML IV SOLN
INTRAVENOUS | Status: AC
  Filled 2021-09-17: qty 100

## 2021-09-17 MED ORDER — KETOROLAC TROMETHAMINE 30 MG/ML IJ SOLN
INTRAMUSCULAR | Status: DC | PRN
  Administered 2021-09-17: 15 mg via INTRAVENOUS

## 2021-09-17 MED ORDER — ONDANSETRON HCL 4 MG/2ML IJ SOLN
INTRAMUSCULAR | Status: AC
  Filled 2021-09-17: qty 2

## 2021-09-17 MED ORDER — PROPOFOL 10 MG/ML IV BOLUS
INTRAVENOUS | Status: AC
  Filled 2021-09-17: qty 20

## 2021-09-17 SURGICAL SUPPLY — 48 items
APL PRP STRL LF DISP 70% ISPRP (MISCELLANEOUS) ×1
BNDG COHESIVE 4X5 TAN ST LF (GAUZE/BANDAGES/DRESSINGS) ×2 IMPLANT
BNDG ELASTIC 4X5.8 VLCR STR LF (GAUZE/BANDAGES/DRESSINGS) ×2 IMPLANT
BNDG ELASTIC 6X5.8 VLCR STR LF (GAUZE/BANDAGES/DRESSINGS) ×1 IMPLANT
BNDG ESMARK 6X12 TAN STRL LF (GAUZE/BANDAGES/DRESSINGS) ×1 IMPLANT
BONE CANC CHIPS 20CC PCAN1/4 (Bone Implant) ×2 IMPLANT
BRUSH SCRUB EZ  4% CHG (MISCELLANEOUS) ×2
BRUSH SCRUB EZ 4% CHG (MISCELLANEOUS) ×1 IMPLANT
CAST PADDING 6X4YD ST 30248 (SOFTGOODS) ×1
CHIPS CANC BONE 20CC PCAN1/4 (Bone Implant) ×1 IMPLANT
CHLORAPREP W/TINT 26 (MISCELLANEOUS) ×2 IMPLANT
CUFF TOURN SGL QUICK 18X4 (TOURNIQUET CUFF) IMPLANT
CUFF TOURN SGL QUICK 24 (TOURNIQUET CUFF)
CUFF TRNQT CYL 24X4X16.5-23 (TOURNIQUET CUFF) IMPLANT
DRAPE FLUOR MINI C-ARM 54X84 (DRAPES) ×2 IMPLANT
DRAPE INCISE IOBAN 66X45 STRL (DRAPES) ×2 IMPLANT
ELECT CAUTERY BLADE 6.4 (BLADE) ×2 IMPLANT
ELECT REM PT RETURN 9FT ADLT (ELECTROSURGICAL) ×2
ELECTRODE REM PT RTRN 9FT ADLT (ELECTROSURGICAL) ×1 IMPLANT
GAUZE 4X4 16PLY ~~LOC~~+RFID DBL (SPONGE) ×2 IMPLANT
GAUZE SPONGE 4X4 12PLY STRL (GAUZE/BANDAGES/DRESSINGS) ×2 IMPLANT
GAUZE XEROFORM 1X8 LF (GAUZE/BANDAGES/DRESSINGS) ×2 IMPLANT
GLOVE SURG ORTHO LTX SZ8 (GLOVE) ×4 IMPLANT
GLOVE SURG UNDER LTX SZ8 (GLOVE) ×2 IMPLANT
GOWN STRL REUS W/ TWL LRG LVL3 (GOWN DISPOSABLE) ×1 IMPLANT
GOWN STRL REUS W/ TWL XL LVL3 (GOWN DISPOSABLE) ×1 IMPLANT
GOWN STRL REUS W/TWL LRG LVL3 (GOWN DISPOSABLE) ×2
GOWN STRL REUS W/TWL XL LVL3 (GOWN DISPOSABLE) ×2
GRAFT BNE CANC CHIPS 1-8 20CC (Bone Implant) IMPLANT
HANDLE YANKAUER SUCT BULB TIP (MISCELLANEOUS) ×1 IMPLANT
KIT TURNOVER KIT A (KITS) ×2 IMPLANT
MANIFOLD NEPTUNE II (INSTRUMENTS) ×2 IMPLANT
NDL FILTER BLUNT 18X1 1/2 (NEEDLE) ×1 IMPLANT
NEEDLE FILTER BLUNT 18X 1/2SAF (NEEDLE) ×1
NEEDLE FILTER BLUNT 18X1 1/2 (NEEDLE) ×1 IMPLANT
NS IRRIG 1000ML POUR BTL (IV SOLUTION) ×2 IMPLANT
PACK EXTREMITY ARMC (MISCELLANEOUS) ×2 IMPLANT
PAD ABD DERMACEA PRESS 5X9 (GAUZE/BANDAGES/DRESSINGS) ×1 IMPLANT
PADDING CAST COTTON 6X4 ST (SOFTGOODS) IMPLANT
SOLUTION PRONTOSAN WOUND 350ML (IRRIGATION / IRRIGATOR) ×1 IMPLANT
STAPLER SKIN PROX 35W (STAPLE) ×3 IMPLANT
STOCKINETTE M/LG 89821 (MISCELLANEOUS) ×2 IMPLANT
SUT PROLENE 4 0 PS 2 18 (SUTURE) ×2 IMPLANT
SUT VIC AB 0 CT1 36 (SUTURE) ×2 IMPLANT
SUT VIC AB 2-0 SH 27 (SUTURE) ×4
SUT VIC AB 2-0 SH 27XBRD (SUTURE) ×2 IMPLANT
SYR 10ML LL (SYRINGE) ×2 IMPLANT
WATER STERILE IRR 500ML POUR (IV SOLUTION) ×2 IMPLANT

## 2021-09-17 NOTE — Anesthesia Procedure Notes (Signed)
Procedure Name: LMA Insertion Date/Time: 09/17/2021 7:42 AM Performed by: Jerrye Noble, CRNA Pre-anesthesia Checklist: Patient identified, Emergency Drugs available, Suction available and Patient being monitored Patient Re-evaluated:Patient Re-evaluated prior to induction Oxygen Delivery Method: Circle system utilized Preoxygenation: Pre-oxygenation with 100% oxygen Induction Type: IV induction LMA Size: 4.5 Number of attempts: 1 Placement Confirmation: positive ETCO2 and breath sounds checked- equal and bilateral Tube secured with: Tape Dental Injury: Teeth and Oropharynx as per pre-operative assessment

## 2021-09-17 NOTE — Anesthesia Preprocedure Evaluation (Signed)
Anesthesia Evaluation  Patient identified by MRN, date of birth, ID band Patient awake    Reviewed: Allergy & Precautions, H&P , NPO status , Patient's Chart, lab work & pertinent test results, reviewed documented beta blocker date and time   History of Anesthesia Complications (+) history of anesthetic complications  Airway Mallampati: III  TM Distance: >3 FB Neck ROM: full    Dental  (+) Teeth Intact   Pulmonary neg pulmonary ROS, former smoker,    Pulmonary exam normal        Cardiovascular Exercise Tolerance: Poor hypertension, On Medications negative cardio ROS Normal cardiovascular exam Rate:Normal     Neuro/Psych  Neuromuscular disease negative psych ROS   GI/Hepatic negative GI ROS, Neg liver ROS,   Endo/Other  negative endocrine ROS  Renal/GU Renal disease  negative genitourinary   Musculoskeletal   Abdominal   Peds  Hematology negative hematology ROS (+)   Anesthesia Other Findings   Reproductive/Obstetrics negative OB ROS                             Anesthesia Physical Anesthesia Plan  ASA: 3  Anesthesia Plan: General LMA   Post-op Pain Management:    Induction:   PONV Risk Score and Plan: 3  Airway Management Planned:   Additional Equipment:   Intra-op Plan:   Post-operative Plan:   Informed Consent: I have reviewed the patients History and Physical, chart, labs and discussed the procedure including the risks, benefits and alternatives for the proposed anesthesia with the patient or authorized representative who has indicated his/her understanding and acceptance.       Plan Discussed with: CRNA  Anesthesia Plan Comments:         Anesthesia Quick Evaluation

## 2021-09-17 NOTE — H&P (Signed)
The patient has been re-examined, and the chart reviewed, and there have been no interval changes to the documented history and physical.  Plan a left tibia hardware removal today.  Anesthesia is consulted regarding a peripheral nerve block for post-operative pain.  The risks, benefits, and alternatives have been discussed at length, and the patient is willing to proceed.

## 2021-09-17 NOTE — Transfer of Care (Signed)
Immediate Anesthesia Transfer of Care Note  Patient: Stephen Ponce  Procedure(s) Performed: HARDWARE REMOVAL KNEE (Left)  Patient Location: PACU  Anesthesia Type:General  Level of Consciousness: drowsy and patient cooperative  Airway & Oxygen Therapy: Patient Spontanous Breathing and Patient connected to face mask oxygen  Post-op Assessment: Report given to RN and Post -op Vital signs reviewed and stable  Post vital signs: Reviewed and stable  Last Vitals:  Vitals Value Taken Time  BP 120/71 09/17/21 0935  Temp    Pulse 72 09/17/21 0940  Resp 18 09/17/21 0940  SpO2 100 % 09/17/21 0940  Vitals shown include unvalidated device data.  Last Pain:  Vitals:   09/17/21 0631  TempSrc: Temporal  PainSc: 0-No pain         Complications: No notable events documented.

## 2021-09-17 NOTE — OR Nursing (Signed)
Dr. [name] explanted screws and plate from patient's left knee. A defect of 5 cm x 2 cm x 1.3 cm was left in the proximal medial tibia.Fuller Mandril, RN

## 2021-09-17 NOTE — Op Note (Signed)
°  09/17/2021  9:49 AM  PATIENT:  Stephen Ponce    PRE-OPERATIVE DIAGNOSIS:  T84.84XA Pain due to internal orthopedic prosth dev/grft, init  POST-OPERATIVE DIAGNOSIS:  Same  PROCEDURE:  HARDWARE REMOVAL LEFT TIBIA, BONE GRAFTING OF LEFT TIBIA  SURGEON:  Lovell Sheehan, MD  ASSIST: Carlynn Spry, PA-C  ANESTHESIA:   General and Local  PREOPERATIVE INDICATIONS:  [name] is a  76 y.o. male with a diagnosis of T84.84XA Pain due to internal orthopedic prosth dev/grft, init who  elected for surgical management of the fracture  The risks benefits and alternatives were discussed with the patient preoperatively including but not limited to the risks of infection, bleeding, nerve injury, cardiopulmonary complications, the need for revision surgery, among others, and the patient was willing to proceed.  EBL: 10 CC  TOURNIQUET TIME: 52 MIN  OPERATIVE IMPLANTS: plate and 2 complete screws and 2 broken screws  OPERATIVE FINDINGS: Dense cortical bone covering the tibial plate and 4 screw heads  OPERATIVE PROCEDURE:   After informed consent was obtained and the appropriate extremity marked in the pre-operative holding area, the patient was taken to the operating room and placed in the supine position. General anesthesia was induced and the left lower extremity was prepped and draped in standard sterile fashion. The extremity was exsanguinated and the tourniquet insufflated to 250 mmHg. The prior incision was utilized. Sharp dissection was taken down to the plate. The screws were removed and the plate passed from the field. This process involved extensive debridement of bone to access the plate. A defect of 5 cm x 2 cm x 1.3 cm was left in the proximal medial tibia.. A rasp was used to smooth the bony prominences. A decision was made to place bone graft. 20 CC of cancellous chips were tamped in to the defect. The wound was irrigated and 0 Vicryl was used for the deep layer, and 2-0 Vicryl for  the subcutaneous layer. Staples were used for skin closure. A sterile dressing was applied. He tolerated the procedure well and was taken to the recovery room in good condition.   [name]. [name], MD

## 2021-09-17 NOTE — Discharge Instructions (Addendum)
Post Op Home Instructions  PATIENT ALREADY HAS FOLLOW-UP AND PHYSICAL THERAPY SCHEDULED  1) Do not sit for longer than 1 hour at a time with your leg dangling down.  You should have your legs elevated (higher than your heart) in a recliner chair or couch.  2) You may be up walking around as tolerated but should take periodic breaks to elevate your legs.  Discontinue use of crutches when you feel you are able to walk without pain or a limp.  3) Work on gentle bending and straightening of the knee.  4) You may remove the Ace wrap and dressings two days after surgery.  Place band aids over the incision sites.  5) You may shower after you remove the surgical dressing.  You do not need to cover the incision with plastic wrap.  The incision can get wet, but do not submerge under water.    6) Pain medication can cause constipation.  You should increase your fluid intake, increase your intake of high fiber foods and/or take Metamucil as needed for constipation.  7) Continue your physical therapy exercises, as shown at the office, at least twice daily.  You should set up outpatient physical therapy and start within the first week after surgery.  8) Use ice for 2-3 days after surgery.  After you remove the surgical dressing, it is a good idea to use your ice pack for 30 minutes after doing your exercises to reduce swelling.  9) Do not be surprised if you have increased pain at night.  This usually means you have been a little too active during the day and need to reduce your activities.  10) If you develop lower extremity swelling that does not improve after a night of elevation, please call the office.  This could be an early sign of a blood clot.  Please call with any questions at [phone] AMBULATORY SURGERY  DISCHARGE INSTRUCTIONS   The drugs that you were given will stay in your system until tomorrow so for the next 24 hours you should not:  Drive an automobile Make any legal  decisions Drink any alcoholic beverage   You may resume regular meals tomorrow.  Today it is better to start with liquids and gradually work up to solid foods.  You may eat anything you prefer, but it is better to start with liquids, then soup and crackers, and gradually work up to solid foods.   Please notify your doctor immediately if you have any unusual bleeding, trouble breathing, redness and pain at the surgery site, drainage, fever, or pain not relieved by medication.    Additional Instructions:   Please contact your physician with any problems or Same Day Surgery at [phone], Monday through Friday 6 am to 4 pm, or [place] at [facility] number at [phone].

## 2021-09-19 ENCOUNTER — Other Ambulatory Visit: Payer: Self-pay | Admitting: Family Medicine

## 2021-09-19 NOTE — Telephone Encounter (Signed)
Requested Prescriptions  Pending Prescriptions Disp Refills   metoprolol succinate (TOPROL-XL) 25 MG 24 hr tablet [Pharmacy Med Name: METOPROLOL SUCC ER 25 MG TAB] 45 tablet 2    Sig: TAKE 1/2 TABLET BY MOUTH EVERY DAY     Cardiovascular:  Beta Blockers Passed - 09/19/2021  1:32 AM      Passed - Last BP in normal range    BP Readings from Last 1 Encounters:  09/17/21 127/80         Passed - Last Heart Rate in normal range    Pulse Readings from Last 1 Encounters:  09/17/21 60         Passed - Valid encounter within last 6 months    Recent Outpatient Visits          2 months ago Encounter for [employer] annual wellness exam   [place] Jerrol Banana., MD   1 year ago Annual physical exam   [facility] Jerrol Banana., MD   2 years ago Annual physical exam   [facility] Jerrol Banana., MD   2 years ago Acute deep vein thrombosis (DVT) of popliteal vein of left lower extremity [facility])   [facility] Jerrol Banana., MD   2 years ago Acute deep vein thrombosis (DVT) of popliteal vein of left lower extremity [facility])   [facility] Jerrol Banana., MD      Future Appointments            In 9 months Jerrol Banana., MD [facility] [place]

## 2021-09-25 DIAGNOSIS — M25662 Stiffness of left knee, not elsewhere classified: Secondary | ICD-10-CM | POA: Diagnosis not present

## 2021-09-25 DIAGNOSIS — M25562 Pain in left knee: Secondary | ICD-10-CM | POA: Diagnosis not present

## 2021-10-03 DIAGNOSIS — M25562 Pain in left knee: Secondary | ICD-10-CM | POA: Diagnosis not present

## 2021-10-03 DIAGNOSIS — M25662 Stiffness of left knee, not elsewhere classified: Secondary | ICD-10-CM | POA: Diagnosis not present

## 2021-10-10 DIAGNOSIS — M25562 Pain in left knee: Secondary | ICD-10-CM | POA: Diagnosis not present

## 2021-10-10 DIAGNOSIS — M25662 Stiffness of left knee, not elsewhere classified: Secondary | ICD-10-CM | POA: Diagnosis not present

## 2021-10-12 NOTE — Anesthesia Postprocedure Evaluation (Signed)
Anesthesia Post Note  Patient: Stephen Ponce  Procedure(s) Performed: HARDWARE REMOVAL KNEE (Left)  Patient location during evaluation: PACU Anesthesia Type: General Level of consciousness: awake and alert Pain management: pain level controlled Vital Signs Assessment: post-procedure vital signs reviewed and stable Respiratory status: spontaneous breathing, nonlabored ventilation, respiratory function stable and patient connected to nasal cannula oxygen Cardiovascular status: blood pressure returned to baseline and stable Postop Assessment: no apparent nausea or vomiting Anesthetic complications: no   No notable events documented.   Last Vitals:  Vitals:   09/17/21 1015 09/17/21 1033  BP: 114/65 127/80  Pulse: 62 60  Resp: 12 16  Temp: (!) 36.4 C (!) 36.1 C  SpO2: 96% 94%    Last Pain:  Vitals:   09/18/21 1000  TempSrc:   PainSc: [place] Alonia Dibuono

## 2021-10-14 DIAGNOSIS — Z4889 Encounter for other specified surgical aftercare: Secondary | ICD-10-CM | POA: Diagnosis not present

## 2021-10-16 DIAGNOSIS — M25562 Pain in left knee: Secondary | ICD-10-CM | POA: Diagnosis not present

## 2021-10-16 DIAGNOSIS — M25662 Stiffness of left knee, not elsewhere classified: Secondary | ICD-10-CM | POA: Diagnosis not present

## 2021-10-22 DIAGNOSIS — M25662 Stiffness of left knee, not elsewhere classified: Secondary | ICD-10-CM | POA: Diagnosis not present

## 2021-10-22 DIAGNOSIS — M25562 Pain in left knee: Secondary | ICD-10-CM | POA: Diagnosis not present

## 2021-10-24 DIAGNOSIS — M25662 Stiffness of left knee, not elsewhere classified: Secondary | ICD-10-CM | POA: Diagnosis not present

## 2021-10-24 DIAGNOSIS — M25562 Pain in left knee: Secondary | ICD-10-CM | POA: Diagnosis not present

## 2021-10-28 DIAGNOSIS — M25562 Pain in left knee: Secondary | ICD-10-CM | POA: Diagnosis not present

## 2021-10-28 DIAGNOSIS — M25662 Stiffness of left knee, not elsewhere classified: Secondary | ICD-10-CM | POA: Diagnosis not present

## 2021-10-30 DIAGNOSIS — M25562 Pain in left knee: Secondary | ICD-10-CM | POA: Diagnosis not present

## 2021-10-30 DIAGNOSIS — M25662 Stiffness of left knee, not elsewhere classified: Secondary | ICD-10-CM | POA: Diagnosis not present

## 2021-11-03 DIAGNOSIS — M25562 Pain in left knee: Secondary | ICD-10-CM | POA: Diagnosis not present

## 2021-11-03 DIAGNOSIS — M25662 Stiffness of left knee, not elsewhere classified: Secondary | ICD-10-CM | POA: Diagnosis not present

## 2021-11-04 DIAGNOSIS — Z4889 Encounter for other specified surgical aftercare: Secondary | ICD-10-CM | POA: Diagnosis not present

## 2021-11-05 DIAGNOSIS — M25562 Pain in left knee: Secondary | ICD-10-CM | POA: Diagnosis not present

## 2021-11-05 DIAGNOSIS — M25662 Stiffness of left knee, not elsewhere classified: Secondary | ICD-10-CM | POA: Diagnosis not present

## 2021-11-07 DIAGNOSIS — H43813 Vitreous degeneration, bilateral: Secondary | ICD-10-CM | POA: Diagnosis not present

## 2021-11-10 DIAGNOSIS — M25662 Stiffness of left knee, not elsewhere classified: Secondary | ICD-10-CM | POA: Diagnosis not present

## 2021-11-10 DIAGNOSIS — M25562 Pain in left knee: Secondary | ICD-10-CM | POA: Diagnosis not present

## 2021-11-17 DIAGNOSIS — M25562 Pain in left knee: Secondary | ICD-10-CM | POA: Diagnosis not present

## 2021-11-17 DIAGNOSIS — M25662 Stiffness of left knee, not elsewhere classified: Secondary | ICD-10-CM | POA: Diagnosis not present

## 2021-11-24 DIAGNOSIS — M25662 Stiffness of left knee, not elsewhere classified: Secondary | ICD-10-CM | POA: Diagnosis not present

## 2021-11-24 DIAGNOSIS — M25562 Pain in left knee: Secondary | ICD-10-CM | POA: Diagnosis not present

## 2021-11-26 DIAGNOSIS — M25662 Stiffness of left knee, not elsewhere classified: Secondary | ICD-10-CM | POA: Diagnosis not present

## 2021-11-26 DIAGNOSIS — M25562 Pain in left knee: Secondary | ICD-10-CM | POA: Diagnosis not present

## 2021-12-01 DIAGNOSIS — M25562 Pain in left knee: Secondary | ICD-10-CM | POA: Diagnosis not present

## 2021-12-01 DIAGNOSIS — M25662 Stiffness of left knee, not elsewhere classified: Secondary | ICD-10-CM | POA: Diagnosis not present

## 2021-12-03 DIAGNOSIS — M25662 Stiffness of left knee, not elsewhere classified: Secondary | ICD-10-CM | POA: Diagnosis not present

## 2021-12-03 DIAGNOSIS — M25562 Pain in left knee: Secondary | ICD-10-CM | POA: Diagnosis not present

## 2021-12-11 DIAGNOSIS — M25562 Pain in left knee: Secondary | ICD-10-CM | POA: Diagnosis not present

## 2021-12-11 DIAGNOSIS — M25662 Stiffness of left knee, not elsewhere classified: Secondary | ICD-10-CM | POA: Diagnosis not present

## 2021-12-23 DIAGNOSIS — M25562 Pain in left knee: Secondary | ICD-10-CM | POA: Diagnosis not present

## 2021-12-23 DIAGNOSIS — M25662 Stiffness of left knee, not elsewhere classified: Secondary | ICD-10-CM | POA: Diagnosis not present

## 2021-12-25 DIAGNOSIS — M25662 Stiffness of left knee, not elsewhere classified: Secondary | ICD-10-CM | POA: Diagnosis not present

## 2021-12-25 DIAGNOSIS — M25562 Pain in left knee: Secondary | ICD-10-CM | POA: Diagnosis not present

## 2021-12-29 DIAGNOSIS — M25662 Stiffness of left knee, not elsewhere classified: Secondary | ICD-10-CM | POA: Diagnosis not present

## 2021-12-29 DIAGNOSIS — M25562 Pain in left knee: Secondary | ICD-10-CM | POA: Diagnosis not present

## 2021-12-31 DIAGNOSIS — M25662 Stiffness of left knee, not elsewhere classified: Secondary | ICD-10-CM | POA: Diagnosis not present

## 2021-12-31 DIAGNOSIS — M25562 Pain in left knee: Secondary | ICD-10-CM | POA: Diagnosis not present

## 2022-01-06 DIAGNOSIS — M1712 Unilateral primary osteoarthritis, left knee: Secondary | ICD-10-CM | POA: Diagnosis not present

## 2022-01-19 ENCOUNTER — Ambulatory Visit (INDEPENDENT_AMBULATORY_CARE_PROVIDER_SITE_OTHER): Payer: Medicare HMO | Admitting: Family Medicine

## 2022-01-19 VITALS — BP 132/71 | HR 75 | Temp 98.3°F | Wt 227.0 lb

## 2022-01-19 DIAGNOSIS — H6122 Impacted cerumen, left ear: Secondary | ICD-10-CM | POA: Diagnosis not present

## 2022-01-19 NOTE — Progress Notes (Signed)
   Acute Office Visit  Subjective:     Patient ID: Stephen Ponce, male    DOB: [birth year], 76 y.o.   MRN: [id number]  No chief complaint on file.   HPI Patient is in today for evaluation of clogged left ear.  Patient states he has been having symptoms for about 1 week.  He has used Dubrox without relief.  He denies any fever.  Review of Systems  Constitutional:  Negative for fever.  HENT:  Negative for ear discharge, ear pain and tinnitus.        Objective:    BP 132/71 (BP Location: Left Arm, Patient Position: Sitting, Cuff Size: Normal)   Pulse 75   Temp 98.3 F (36.8 C) (Oral)   Wt 227 lb (103 kg)   SpO2 95%   BMI 33.52 kg/m  BP Readings from Last 3 Encounters:  01/19/22 132/71  09/17/21 127/80  09/09/21 (!) 141/71   Wt Readings from Last 3 Encounters:  01/19/22 227 lb (103 kg)  09/17/21 228 lb (103.4 kg)  09/09/21 228 lb 12.8 oz (103.8 kg)      Physical Exam Vitals reviewed.  HENT:     Right Ear: Ear canal and external ear normal.     Left Ear: Ear canal and external ear normal.  Skin:    General: Skin is warm and dry.  Neurological:     Mental Status: He is alert and oriented to person, place, and time.  Psychiatric:        Mood and Affect: Mood normal.        Behavior: Behavior normal.        Thought Content: Thought content normal.        Judgment: Judgment normal.    No results found for any visits on 01/19/22.      Assessment & Plan:   Problem List Items Addressed This Visit   None 1. Excessive cerumen in left ear canal Irrigation cleared the canal   No orders of the defined types were placed in this encounter.   No follow-ups on file.  Juluis Mire, CMA I have reviewed the chronic care advisor's note and agree with the plan.  I was available for consultation and will follow the patient with the chronic care management team going forward. Miguel Aschoff, MD

## 2022-01-28 ENCOUNTER — Encounter: Payer: Self-pay | Admitting: Family Medicine

## 2022-01-28 ENCOUNTER — Ambulatory Visit (INDEPENDENT_AMBULATORY_CARE_PROVIDER_SITE_OTHER): Payer: Medicare HMO | Admitting: Family Medicine

## 2022-01-28 VITALS — BP 130/68 | HR 71 | Resp 16 | Ht 69.0 in | Wt 224.0 lb

## 2022-01-28 DIAGNOSIS — Z01818 Encounter for other preprocedural examination: Secondary | ICD-10-CM

## 2022-01-28 DIAGNOSIS — I10 Essential (primary) hypertension: Secondary | ICD-10-CM | POA: Diagnosis not present

## 2022-01-28 DIAGNOSIS — M25562 Pain in left knee: Secondary | ICD-10-CM | POA: Diagnosis not present

## 2022-01-28 DIAGNOSIS — G8929 Other chronic pain: Secondary | ICD-10-CM

## 2022-01-28 DIAGNOSIS — R739 Hyperglycemia, unspecified: Secondary | ICD-10-CM | POA: Diagnosis not present

## 2022-01-28 LAB — POCT URINALYSIS DIPSTICK
Bilirubin, UA: NEGATIVE
Blood, UA: NEGATIVE
Glucose, UA: NEGATIVE
Ketones, UA: NEGATIVE
Leukocytes, UA: NEGATIVE
Nitrite, UA: NEGATIVE
Protein, UA: NEGATIVE
Spec Grav, UA: 1.02 (ref 1.010–1.025)
Urobilinogen, UA: 0.2 E.U./dL
pH, UA: 6 (ref 5.0–8.0)

## 2022-01-28 NOTE — Progress Notes (Signed)
Established patient visit  I,April Miller,acting as a scribe for Wilhemena Durie, MD.,have documented all relevant documentation on the behalf of Wilhemena Durie, MD,as directed by  Wilhemena Durie, MD while in the presence of Wilhemena Durie, MD.   Patient: Stephen Ponce   DOB: 10-Nov-1945   76 y.o. Male  MRN: [id number] Visit Date: 01/28/2022  Today's healthcare provider: Wilhemena Durie, MD   Chief Complaint  Patient presents with   Pre-op Exam   Subjective    HPI  Patient is here for Pre-Op exam for left knee surgery.. He has no chest pain or shortness of breath or any neurologic symptoms.  Despite the bad knee he was able to walk up a flight of stairs to the office today without any difficulty at all.  Medications: Outpatient Medications Prior to Visit  Medication Sig   ascorbic acid (VITAMIN C) 1000 MG tablet Take 1,000 mg by mouth in the morning.   aspirin-acetaminophen-caffeine (EXCEDRIN MIGRAINE) 250-250-65 MG tablet Take 1 tablet by mouth 2 (two) times daily as needed for headache.   Cholecalciferol (VITAMIN D3) 50 MCG (2000 UT) TABS Take 2,000 Units by mouth in the morning.   cyanocobalamin 1000 MCG tablet Take 1,000 mcg by mouth daily.   docusate sodium (COLACE) 100 MG capsule Take 1 capsule (100 mg total) by mouth daily as needed.   Glucosamine-Chondroitin (COSAMIN DS PO) Take 1 tablet by mouth in the morning.   HYDROcodone-acetaminophen (NORCO/VICODIN) 5-325 MG tablet Take 1 tablet by mouth every 4 (four) hours as needed for moderate pain.   MegaRed Omega-3 Krill Oil 500 MG CAPS Take 500 mg by mouth in the morning.   metoprolol succinate (TOPROL-XL) 25 MG 24 hr tablet TAKE 1/2 TABLET BY MOUTH EVERY DAY   Multiple Vitamin (MULTIVITAMIN WITH MINERALS) TABS tablet Take 1 tablet by mouth daily. One A Day for Men 50+   naproxen sodium (ALEVE) 220 MG tablet Take 220 mg by mouth 2 (two) times daily as needed.   pyridoxine (B-6) 200 MG tablet Take 200  mg by mouth in the morning.   Ubiquinol 100 MG CAPS Take 100 mg by mouth in the morning.   No facility-administered medications prior to visit.    Review of Systems  Constitutional:  Negative for appetite change, chills and fever.  Respiratory:  Negative for chest tightness, shortness of breath and wheezing.   Cardiovascular:  Negative for chest pain and palpitations.  Gastrointestinal:  Negative for abdominal pain, nausea and vomiting.       Objective    BP 130/68 (BP Location: Right Arm, Patient Position: Sitting, Cuff Size: Large)   Pulse 71   Resp 16   Ht '5\' 9"'$  (1.753 m)   Wt 224 lb (101.6 kg)   SpO2 96%   BMI 33.08 kg/m  BP Readings from Last 3 Encounters:  01/28/22 130/68  01/19/22 132/71  09/17/21 127/80   Wt Readings from Last 3 Encounters:  01/28/22 224 lb (101.6 kg)  01/19/22 227 lb (103 kg)  09/17/21 228 lb (103.4 kg)      Physical Exam Vitals reviewed.  Constitutional:      General: He is not in acute distress.    Appearance: He is well-developed.  HENT:     Head: Normocephalic and atraumatic.     Right Ear: Hearing normal.     Left Ear: Hearing normal.     Nose: Nose normal.  Eyes:     General: Lids  are normal. No scleral icterus.       Right eye: No discharge.        Left eye: No discharge.     Conjunctiva/sclera: Conjunctivae normal.  Cardiovascular:     Rate and Rhythm: Normal rate and regular rhythm.     Heart sounds: Normal heart sounds.  Pulmonary:     Effort: Pulmonary effort is normal. No respiratory distress.  Skin:    Findings: No lesion or rash.     Comments: Trace lower extremity edema  Neurological:     General: No focal deficit present.     Mental Status: He is alert and oriented to person, place, and time.  Psychiatric:        Mood and Affect: Mood normal.        Speech: Speech normal.        Behavior: Behavior normal.        Thought Content: Thought content normal.        Judgment: Judgment normal.    ECG reveals normal  sinus rhythm rate of about 65 without ischemic changes  No results found for any visits on 01/28/22.  Assessment & Plan     1. Pre-op exam Cleared for surgery.  They are planning on anticoagulation after surgery. - POCT urinalysis dipstick - Comprehensive Metabolic Panel (CMET) - Hemoglobin A1c  2. Essential hypertension   3. Chronic pain of left knee    No follow-ups on file.      I, Wilhemena Durie, MD, have reviewed all documentation for this visit. The documentation on 01/28/22 for the exam, diagnosis, procedures, and orders are all accurate and complete.    Darold Miley Cranford Mon, MD  [facility] [phone] (phone) [phone] (fax)  [place]

## 2022-01-29 LAB — HEMOGLOBIN A1C
Est. average glucose Bld gHb Est-mCnc: 117 mg/dL
Hgb A1c MFr Bld: 5.7 % — ABNORMAL HIGH (ref 4.8–5.6)

## 2022-01-29 LAB — COMPREHENSIVE METABOLIC PANEL
ALT: 33 IU/L (ref 0–44)
AST: 24 IU/L (ref 0–40)
Albumin/Globulin Ratio: 1.8 (ref 1.2–2.2)
Albumin: 4.8 g/dL — ABNORMAL HIGH (ref 3.7–4.7)
Alkaline Phosphatase: 60 IU/L (ref 44–121)
BUN/Creatinine Ratio: 27 — ABNORMAL HIGH (ref 10–24)
BUN: 19 mg/dL (ref 8–27)
Bilirubin Total: 0.9 mg/dL (ref 0.0–1.2)
CO2: 20 mmol/L (ref 20–29)
Calcium: 10 mg/dL (ref 8.6–10.2)
Chloride: 101 mmol/L (ref 96–106)
Creatinine, Ser: 0.7 mg/dL — ABNORMAL LOW (ref 0.76–1.27)
Globulin, Total: 2.7 g/dL (ref 1.5–4.5)
Glucose: 98 mg/dL (ref 70–99)
Potassium: 4.4 mmol/L (ref 3.5–5.2)
Sodium: 138 mmol/L (ref 134–144)
Total Protein: 7.5 g/dL (ref 6.0–8.5)
eGFR: 95 mL/min/{1.73_m2} (ref >59)

## 2022-02-03 HISTORY — PX: TOTAL KNEE ARTHROPLASTY: SHX125

## 2022-02-04 DIAGNOSIS — Z4789 Encounter for other orthopedic aftercare: Secondary | ICD-10-CM | POA: Diagnosis not present

## 2022-02-04 DIAGNOSIS — M1712 Unilateral primary osteoarthritis, left knee: Secondary | ICD-10-CM | POA: Diagnosis not present

## 2022-02-04 DIAGNOSIS — Z96652 Presence of left artificial knee joint: Secondary | ICD-10-CM | POA: Diagnosis not present

## 2022-02-04 DIAGNOSIS — Z791 Long term (current) use of non-steroidal anti-inflammatories (NSAID): Secondary | ICD-10-CM | POA: Diagnosis not present

## 2022-02-04 DIAGNOSIS — E559 Vitamin D deficiency, unspecified: Secondary | ICD-10-CM | POA: Diagnosis not present

## 2022-02-04 DIAGNOSIS — I10 Essential (primary) hypertension: Secondary | ICD-10-CM | POA: Diagnosis not present

## 2022-02-04 DIAGNOSIS — E669 Obesity, unspecified: Secondary | ICD-10-CM | POA: Diagnosis not present

## 2022-02-04 DIAGNOSIS — E538 Deficiency of other specified B group vitamins: Secondary | ICD-10-CM | POA: Diagnosis not present

## 2022-02-04 DIAGNOSIS — Z6833 Body mass index (BMI) 33.0-33.9, adult: Secondary | ICD-10-CM | POA: Diagnosis not present

## 2022-02-04 DIAGNOSIS — G8918 Other acute postprocedural pain: Secondary | ICD-10-CM | POA: Diagnosis not present

## 2022-02-04 DIAGNOSIS — M25562 Pain in left knee: Secondary | ICD-10-CM | POA: Diagnosis not present

## 2022-02-04 DIAGNOSIS — T84197A Other mechanical complication of internal fixation device of bone of left lower leg, initial encounter: Secondary | ICD-10-CM | POA: Diagnosis not present

## 2022-02-05 DIAGNOSIS — E559 Vitamin D deficiency, unspecified: Secondary | ICD-10-CM | POA: Diagnosis not present

## 2022-02-05 DIAGNOSIS — E669 Obesity, unspecified: Secondary | ICD-10-CM | POA: Diagnosis not present

## 2022-02-05 DIAGNOSIS — Z791 Long term (current) use of non-steroidal anti-inflammatories (NSAID): Secondary | ICD-10-CM | POA: Diagnosis not present

## 2022-02-05 DIAGNOSIS — E538 Deficiency of other specified B group vitamins: Secondary | ICD-10-CM | POA: Diagnosis not present

## 2022-02-05 DIAGNOSIS — I10 Essential (primary) hypertension: Secondary | ICD-10-CM | POA: Diagnosis not present

## 2022-02-05 DIAGNOSIS — Z6833 Body mass index (BMI) 33.0-33.9, adult: Secondary | ICD-10-CM | POA: Diagnosis not present

## 2022-02-05 DIAGNOSIS — M1712 Unilateral primary osteoarthritis, left knee: Secondary | ICD-10-CM | POA: Diagnosis not present

## 2022-02-11 DIAGNOSIS — M25662 Stiffness of left knee, not elsewhere classified: Secondary | ICD-10-CM | POA: Diagnosis not present

## 2022-02-11 DIAGNOSIS — M25562 Pain in left knee: Secondary | ICD-10-CM | POA: Diagnosis not present

## 2022-02-13 DIAGNOSIS — M25562 Pain in left knee: Secondary | ICD-10-CM | POA: Diagnosis not present

## 2022-02-13 DIAGNOSIS — M25662 Stiffness of left knee, not elsewhere classified: Secondary | ICD-10-CM | POA: Diagnosis not present

## 2022-02-17 DIAGNOSIS — Z96659 Presence of unspecified artificial knee joint: Secondary | ICD-10-CM | POA: Diagnosis not present

## 2022-02-18 DIAGNOSIS — M25562 Pain in left knee: Secondary | ICD-10-CM | POA: Diagnosis not present

## 2022-02-18 DIAGNOSIS — M25662 Stiffness of left knee, not elsewhere classified: Secondary | ICD-10-CM | POA: Diagnosis not present

## 2022-02-20 DIAGNOSIS — M25662 Stiffness of left knee, not elsewhere classified: Secondary | ICD-10-CM | POA: Diagnosis not present

## 2022-02-20 DIAGNOSIS — M25562 Pain in left knee: Secondary | ICD-10-CM | POA: Diagnosis not present

## 2022-02-25 DIAGNOSIS — M25662 Stiffness of left knee, not elsewhere classified: Secondary | ICD-10-CM | POA: Diagnosis not present

## 2022-02-25 DIAGNOSIS — M25562 Pain in left knee: Secondary | ICD-10-CM | POA: Diagnosis not present

## 2022-02-27 DIAGNOSIS — M25562 Pain in left knee: Secondary | ICD-10-CM | POA: Diagnosis not present

## 2022-02-27 DIAGNOSIS — M25662 Stiffness of left knee, not elsewhere classified: Secondary | ICD-10-CM | POA: Diagnosis not present

## 2022-03-04 DIAGNOSIS — M25562 Pain in left knee: Secondary | ICD-10-CM | POA: Diagnosis not present

## 2022-03-04 DIAGNOSIS — M25662 Stiffness of left knee, not elsewhere classified: Secondary | ICD-10-CM | POA: Diagnosis not present

## 2022-03-06 DIAGNOSIS — M25562 Pain in left knee: Secondary | ICD-10-CM | POA: Diagnosis not present

## 2022-03-06 DIAGNOSIS — M25662 Stiffness of left knee, not elsewhere classified: Secondary | ICD-10-CM | POA: Diagnosis not present

## 2022-03-09 DIAGNOSIS — M25662 Stiffness of left knee, not elsewhere classified: Secondary | ICD-10-CM | POA: Diagnosis not present

## 2022-03-09 DIAGNOSIS — M25562 Pain in left knee: Secondary | ICD-10-CM | POA: Diagnosis not present

## 2022-03-13 DIAGNOSIS — M25662 Stiffness of left knee, not elsewhere classified: Secondary | ICD-10-CM | POA: Diagnosis not present

## 2022-03-13 DIAGNOSIS — M25562 Pain in left knee: Secondary | ICD-10-CM | POA: Diagnosis not present

## 2022-03-17 DIAGNOSIS — Z96652 Presence of left artificial knee joint: Secondary | ICD-10-CM | POA: Diagnosis not present

## 2022-03-20 DIAGNOSIS — M25562 Pain in left knee: Secondary | ICD-10-CM | POA: Diagnosis not present

## 2022-03-20 DIAGNOSIS — M25662 Stiffness of left knee, not elsewhere classified: Secondary | ICD-10-CM | POA: Diagnosis not present

## 2022-03-25 DIAGNOSIS — M25562 Pain in left knee: Secondary | ICD-10-CM | POA: Diagnosis not present

## 2022-03-25 DIAGNOSIS — M25662 Stiffness of left knee, not elsewhere classified: Secondary | ICD-10-CM | POA: Diagnosis not present

## 2022-04-08 DIAGNOSIS — M25662 Stiffness of left knee, not elsewhere classified: Secondary | ICD-10-CM | POA: Diagnosis not present

## 2022-04-08 DIAGNOSIS — M25562 Pain in left knee: Secondary | ICD-10-CM | POA: Diagnosis not present

## 2022-04-14 DIAGNOSIS — M25562 Pain in left knee: Secondary | ICD-10-CM | POA: Diagnosis not present

## 2022-04-14 DIAGNOSIS — M25662 Stiffness of left knee, not elsewhere classified: Secondary | ICD-10-CM | POA: Diagnosis not present

## 2022-04-23 DIAGNOSIS — M25562 Pain in left knee: Secondary | ICD-10-CM | POA: Diagnosis not present

## 2022-04-23 DIAGNOSIS — M25662 Stiffness of left knee, not elsewhere classified: Secondary | ICD-10-CM | POA: Diagnosis not present

## 2022-05-08 DIAGNOSIS — E669 Obesity, unspecified: Secondary | ICD-10-CM | POA: Diagnosis not present

## 2022-05-08 DIAGNOSIS — Z825 Family history of asthma and other chronic lower respiratory diseases: Secondary | ICD-10-CM | POA: Diagnosis not present

## 2022-05-08 DIAGNOSIS — I10 Essential (primary) hypertension: Secondary | ICD-10-CM | POA: Diagnosis not present

## 2022-05-08 DIAGNOSIS — N529 Male erectile dysfunction, unspecified: Secondary | ICD-10-CM | POA: Diagnosis not present

## 2022-05-08 DIAGNOSIS — Z85828 Personal history of other malignant neoplasm of skin: Secondary | ICD-10-CM | POA: Diagnosis not present

## 2022-05-08 DIAGNOSIS — Z809 Family history of malignant neoplasm, unspecified: Secondary | ICD-10-CM | POA: Diagnosis not present

## 2022-05-08 DIAGNOSIS — Z87891 Personal history of nicotine dependence: Secondary | ICD-10-CM | POA: Diagnosis not present

## 2022-05-08 DIAGNOSIS — Z8249 Family history of ischemic heart disease and other diseases of the circulatory system: Secondary | ICD-10-CM | POA: Diagnosis not present

## 2022-05-08 DIAGNOSIS — Z6832 Body mass index (BMI) 32.0-32.9, adult: Secondary | ICD-10-CM | POA: Diagnosis not present

## 2022-06-17 ENCOUNTER — Other Ambulatory Visit: Payer: Self-pay | Admitting: Family Medicine

## 2022-06-17 NOTE — Telephone Encounter (Signed)
Requested medication (s) are due for refill today: yes  Requested medication (s) are on the active medication list:yes  Last refill:  09/19/21  Future visit scheduled:no  Notes to clinic:  Called, spoke with patient. He states that he is unsure if he will be continuing care at this practice. He states he will inquire about  his medical records once he decides on where he want to establish new PCP.     Requested Prescriptions  Pending Prescriptions Disp Refills   metoprolol succinate (TOPROL-XL) 25 MG 24 hr tablet [Pharmacy Med Name: METOPROLOL SUCC ER 25 MG TAB] 45 tablet 2    Sig: TAKE 1/2 TABLET BY MOUTH EVERY DAY     Cardiovascular:  Beta Blockers Passed - 06/17/2022  2:17 AM      Passed - Last BP in normal range    BP Readings from Last 1 Encounters:  01/28/22 130/68         Passed - Last Heart Rate in normal range    Pulse Readings from Last 1 Encounters:  01/28/22 71         Passed - Valid encounter within last 6 months    Recent Outpatient Visits           4 months ago Pre-op exam   [facility] Jerrol Banana., MD   4 months ago Excessive cerumen in left ear canal   [facility] Jerrol Banana., MD   11 months ago Encounter for [employer] annual wellness exam   [facility] Jerrol Banana., MD   2 years ago Annual physical exam   [facility] Jerrol Banana., MD   3 years ago Annual physical exam   [facility] Jerrol Banana., MD       Future Appointments             In 2 weeks Jerrol Banana., MD [facility], [place]

## 2022-07-02 ENCOUNTER — Ambulatory Visit: Payer: Medicare HMO | Admitting: Dermatology

## 2022-07-02 DIAGNOSIS — L578 Other skin changes due to chronic exposure to nonionizing radiation: Secondary | ICD-10-CM | POA: Diagnosis not present

## 2022-07-02 DIAGNOSIS — D229 Melanocytic nevi, unspecified: Secondary | ICD-10-CM

## 2022-07-02 DIAGNOSIS — Z85828 Personal history of other malignant neoplasm of skin: Secondary | ICD-10-CM | POA: Diagnosis not present

## 2022-07-02 DIAGNOSIS — B078 Other viral warts: Secondary | ICD-10-CM | POA: Diagnosis not present

## 2022-07-02 DIAGNOSIS — Z1283 Encounter for screening for malignant neoplasm of skin: Secondary | ICD-10-CM | POA: Diagnosis not present

## 2022-07-02 DIAGNOSIS — L820 Inflamed seborrheic keratosis: Secondary | ICD-10-CM | POA: Diagnosis not present

## 2022-07-02 DIAGNOSIS — Z8589 Personal history of malignant neoplasm of other organs and systems: Secondary | ICD-10-CM

## 2022-07-02 DIAGNOSIS — L821 Other seborrheic keratosis: Secondary | ICD-10-CM

## 2022-07-02 DIAGNOSIS — Z86007 Personal history of in-situ neoplasm of skin: Secondary | ICD-10-CM

## 2022-07-02 DIAGNOSIS — L814 Other melanin hyperpigmentation: Secondary | ICD-10-CM

## 2022-07-02 NOTE — Progress Notes (Signed)
Follow-Up Visit   Subjective  Stephen Ponce is a 76 y.o. male who presents for the following: Skin Problem. Hx of SCC, hx of BCC. The patient presents for Upper Body Skin Exam (UBSE) for skin cancer screening and mole check.  The patient has spots, moles and lesions to be evaluated, some may be new or changing and the patient has concerns that these could be cancer.   The following portions of the chart were reviewed this encounter and updated as appropriate:   Tobacco  Allergies  Meds  Problems  Med Hx  Surg Hx  Fam Hx     Review of Systems:  No other skin or systemic complaints except as noted in HPI or Assessment and Plan.  Objective  Well appearing patient in no apparent distress; mood and affect are within normal limits.  All skin waist up examined.  face x 16 (16) Stuck-on, waxy, tan-brown papules -- Discussed benign etiology and prognosis.   right hand x 7, left hand x 1  (8) (8) Verrucous papules -- Discussed viral etiology and contagion.    Assessment & Plan  Inflamed seborrheic keratosis (16) face x 16 Symptomatic, irritating, patient would like treated.  Destruction of lesion - face x 16 Complexity: simple   Destruction method: cryotherapy   Informed consent: discussed and consent obtained   Timeout:  patient name, date of birth, surgical site, and procedure verified Lesion destroyed using liquid nitrogen: Yes   Region frozen until ice ball extended beyond lesion: Yes   Outcome: patient tolerated procedure well with no complications   Post-procedure details: wound care instructions given    Other viral warts (8) right hand x 7, left hand x 1  (8) Discussed viral etiology and risk of spread.  Discussed multiple treatments may be required to clear warts.  Discussed possible post-treatment dyspigmentation and risk of recurrence.   Destruction of lesion - right hand x 7, left hand x 1  (8) Complexity: simple   Destruction method: cryotherapy   Informed  consent: discussed and consent obtained   Timeout:  patient name, date of birth, surgical site, and procedure verified Lesion destroyed using liquid nitrogen: Yes   Region frozen until ice ball extended beyond lesion: Yes   Outcome: patient tolerated procedure well with no complications   Post-procedure details: wound care instructions given    Lentigines - Scattered tan macules - Due to sun exposure - Benign-appearing, observe - Recommend daily broad spectrum sunscreen SPF 30+ to sun-exposed areas, reapply every 2 hours as needed. - Call for any changes  Seborrheic Keratoses - Stuck-on, waxy, tan-brown papules and/or plaques  - Benign-appearing - Discussed benign etiology and prognosis. - Observe - Call for any changes  Melanocytic Nevi - Tan-brown and/or pink-flesh-colored symmetric macules and papules - Benign appearing on exam today - Observation - Call clinic for new or changing moles - Recommend daily use of broad spectrum spf 30+ sunscreen to sun-exposed areas.   Hemangiomas - Red papules - Discussed benign nature - Observe - Call for any changes  Actinic Damage - Chronic condition, secondary to cumulative UV/sun exposure - diffuse scaly erythematous macules with underlying dyspigmentation - Recommend daily broad spectrum sunscreen SPF 30+ to sun-exposed areas, reapply every 2 hours as needed.  - Staying in the shade or wearing long sleeves, sun glasses (UVA+UVB protection) and wide brim hats (4-inch brim around the entire circumference of the hat) are also recommended for sun protection.  - Call for new or changing  lesions.  Skin cancer screening performed today.   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  - Call if any new or changing lesions are noted between office visits  Return in about 1 year (around 07/03/2023) for TBSE, hx of BCC, hx of SCC.  IMarye Round, CMA, am acting as scribe for Sarina Ser, MD .  Documentation: I have reviewed the above documentation for accuracy and completeness, and I agree with the above.  Sarina Ser, MD

## 2022-07-02 NOTE — Patient Instructions (Addendum)
Cryotherapy Aftercare  Wash gently with soap and water everyday.   Apply Vaseline and Band-Aid daily until healed.     Due to recent changes in healthcare laws, you may see results of your pathology and/or laboratory studies on MyChart before the doctors have had a chance to review them. We understand that in some cases there may be results that are confusing or concerning to you. Please understand that not all results are received at the same time and often the doctors may need to interpret multiple results in order to provide you with the best plan of care or course of treatment. Therefore, we ask that you please give us 2 business days to thoroughly review all your results before contacting the office for clarification. Should we see a critical lab result, you will be contacted sooner.   If You Need Anything After Your Visit  If you have any questions or concerns for your doctor, please call our main line at [phone] and press option 4 to reach your doctor's medical assistant. If no one answers, please leave a voicemail as directed and we will return your call as soon as possible. Messages left after 4 pm will be answered the following business day.   You may also send us a message via MyChart. We typically respond to MyChart messages within 1-2 business days.  For prescription refills, please ask your pharmacy to contact our office. Our fax number is [phone].  If you have an urgent issue when the clinic is closed that cannot wait until the next business day, you can page your doctor at the number below.    Please note that while we do our best to be available for urgent issues outside of office hours, we are not available 24/7.   If you have an urgent issue and are unable to reach us, you may choose to seek medical care at your doctor's office, retail clinic, urgent care center, or emergency room.  If you have a medical emergency, please immediately call 911 or go to the  emergency department.  Pager Numbers  - Dr. Kowalski: [phone]  - Dr. Moye: [phone]  - Dr. Stewart: [phone]  In the event of inclement weather, please call our main line at [phone] for an update on the status of any delays or closures.  Dermatology Medication Tips: Please keep the boxes that topical medications come in in order to help keep track of the instructions about where and how to use these. Pharmacies typically print the medication instructions only on the boxes and not directly on the medication tubes.   If your medication is too expensive, please contact our office at [phone] option 4 or send us a message through MyChart.   We are unable to tell what your co-pay for medications will be in advance as this is different depending on your insurance coverage. However, we may be able to find a substitute medication at lower cost or fill out paperwork to get insurance to cover a needed medication.   If a prior authorization is required to get your medication covered by your insurance company, please allow us 1-2 business days to complete this process.  Drug prices often vary depending on where the prescription is filled and some pharmacies may offer cheaper prices.  The website www.goodrx.com contains coupons for medications through different pharmacies. The prices here do not account for what the cost may be with help from insurance (it may be cheaper with your insurance), but the website can   give you the price if you did not use any insurance.  - You can print the associated coupon and take it with your prescription to the pharmacy.  - You may also stop by our office during regular business hours and pick up a GoodRx coupon card.  - If you need your prescription sent electronically to a different pharmacy, notify our office through [facility] MyChart or by phone at [phone] option 4.     Si Usted Necesita Algo Despus de Su Visita  Tambin puede  enviarnos un mensaje a travs de MyChart. Por lo general respondemos a los mensajes de MyChart en el transcurso de 1 a 2 das hbiles.  Para renovar recetas, por favor pida a su farmacia que se ponga en contacto con nuestra oficina. Nuestro nmero de fax es el [phone].  Si tiene un asunto urgente cuando la clnica est cerrada y que no puede esperar hasta el siguiente da hbil, puede llamar/localizar a su doctor(a) al nmero que aparece a continuacin.   Por favor, tenga en cuenta que aunque hacemos todo lo posible para estar disponibles para asuntos urgentes fuera del horario de oficina, no estamos disponibles las 24 horas del da, los 7 das de la semana.   Si tiene un problema urgente y no puede comunicarse con nosotros, puede optar por buscar atencin mdica  en el consultorio de su doctor(a), en una clnica privada, en un centro de atencin urgente o en una sala de emergencias.  Si tiene una emergencia mdica, por favor llame inmediatamente al 911 o vaya a la sala de emergencias.  Nmeros de bper  - Dr. Kowalski: [phone]  - Dra. Moye: [phone]  - Dra. Stewart: [phone]  En caso de inclemencias del tiempo, por favor llame a nuestra lnea principal al [phone] para una actualizacin sobre el estado de cualquier retraso o cierre.  Consejos para la medicacin en dermatologa: Por favor, guarde las cajas en las que vienen los medicamentos de uso tpico para ayudarle a seguir las instrucciones sobre dnde y cmo usarlos. Las farmacias generalmente imprimen las instrucciones del medicamento slo en las cajas y no directamente en los tubos del medicamento.   Si su medicamento es muy caro, por favor, pngase en contacto con nuestra oficina llamando al [phone] y presione la opcin 4 o envenos un mensaje a travs de MyChart.   No podemos decirle cul ser su copago por los medicamentos por adelantado ya que esto es diferente dependiendo de la cobertura de su seguro.  Sin embargo, es posible que podamos encontrar un medicamento sustituto a menor costo o llenar un formulario para que el seguro cubra el medicamento que se considera necesario.   Si se requiere una autorizacin previa para que su compaa de seguros cubra su medicamento, por favor permtanos de 1 a 2 das hbiles para completar este proceso.  Los precios de los medicamentos varan con frecuencia dependiendo del lugar de dnde se surte la receta y alguna farmacias pueden ofrecer precios ms baratos.  El sitio web www.goodrx.com tiene cupones para medicamentos de diferentes farmacias. Los precios aqu no tienen en cuenta lo que podra costar con la ayuda del seguro (puede ser ms barato con su seguro), pero el sitio web puede darle el precio si no utiliz ningn seguro.  - Puede imprimir el cupn correspondiente y llevarlo con su receta a la farmacia.  - Tambin puede pasar por nuestra oficina durante el horario de atencin regular y recoger una tarjeta de cupones de GoodRx.  -   Si necesita que su receta se enve electrnicamente a una farmacia diferente, informe a nuestra oficina a travs de MyChart de [place] o por telfono llamando al [phone] y presione la opcin 4.  

## 2022-07-06 ENCOUNTER — Encounter: Payer: Medicare HMO | Admitting: Family Medicine

## 2022-07-06 ENCOUNTER — Encounter: Payer: Self-pay | Admitting: Dermatology

## 2022-08-25 ENCOUNTER — Encounter: Payer: Self-pay | Admitting: Nurse Practitioner

## 2022-08-25 ENCOUNTER — Ambulatory Visit (INDEPENDENT_AMBULATORY_CARE_PROVIDER_SITE_OTHER): Payer: Medicare HMO | Admitting: Nurse Practitioner

## 2022-08-25 VITALS — BP 136/76 | HR 74 | Temp 98.2°F | Resp 16 | Ht 69.0 in | Wt 228.1 lb

## 2022-08-25 DIAGNOSIS — R600 Localized edema: Secondary | ICD-10-CM | POA: Insufficient documentation

## 2022-08-25 DIAGNOSIS — I10 Essential (primary) hypertension: Secondary | ICD-10-CM | POA: Diagnosis not present

## 2022-08-25 DIAGNOSIS — E669 Obesity, unspecified: Secondary | ICD-10-CM | POA: Diagnosis not present

## 2022-08-25 DIAGNOSIS — Z0000 Encounter for general adult medical examination without abnormal findings: Secondary | ICD-10-CM | POA: Diagnosis not present

## 2022-08-25 DIAGNOSIS — Z125 Encounter for screening for malignant neoplasm of prostate: Secondary | ICD-10-CM

## 2022-08-25 LAB — COMPREHENSIVE METABOLIC PANEL
ALT: 31 U/L (ref 0–53)
AST: 26 U/L (ref 0–37)
Albumin: 4.2 g/dL (ref 3.5–5.2)
Alkaline Phosphatase: 54 U/L (ref 39–117)
BUN: 15 mg/dL (ref 6–23)
CO2: 27 mEq/L (ref 19–32)
Calcium: 9.5 mg/dL (ref 8.4–10.5)
Chloride: 103 mEq/L (ref 96–112)
Creatinine, Ser: 0.63 mg/dL (ref 0.40–1.50)
GFR: 92.09 mL/min (ref >60.00)
Glucose, Bld: 97 mg/dL (ref 70–99)
Potassium: 4.6 mEq/L (ref 3.5–5.1)
Sodium: 137 mEq/L (ref 135–145)
Total Bilirubin: 1.1 mg/dL (ref 0.2–1.2)
Total Protein: 7 g/dL (ref 6.0–8.3)

## 2022-08-25 LAB — LIPID PANEL
Cholesterol: 150 mg/dL (ref 0–200)
HDL: 39.9 mg/dL (ref >39.00)
LDL Cholesterol: 87 mg/dL (ref 0–99)
NonHDL: 109.9
Total CHOL/HDL Ratio: 4
Triglycerides: 115 mg/dL (ref 0.0–149.0)
VLDL: 23 mg/dL (ref 0.0–40.0)

## 2022-08-25 LAB — CBC
HCT: 46.2 % (ref 39.0–52.0)
Hemoglobin: 15.6 g/dL (ref 13.0–17.0)
MCHC: 33.8 g/dL (ref 30.0–36.0)
MCV: 90.8 fl (ref 78.0–100.0)
Platelets: 222 10*3/uL (ref 150.0–400.0)
RBC: 5.09 Mil/uL (ref 4.22–5.81)
RDW: 14.2 % (ref 11.5–15.5)
WBC: 7.1 10*3/uL (ref 4.0–10.5)

## 2022-08-25 LAB — BRAIN NATRIURETIC PEPTIDE: Pro B Natriuretic peptide (BNP): 51 pg/mL (ref 0.0–100.0)

## 2022-08-25 LAB — PSA, MEDICARE: PSA: 1.24 ng/ml (ref 0.10–4.00)

## 2022-08-25 NOTE — Assessment & Plan Note (Signed)
Patient currently maintained on metoprolol.  Blood pressure within normal limits.  Tolerating medication well.  Continue metoprolol as prescribed

## 2022-08-25 NOTE — Progress Notes (Signed)
New Patient Office Visit  Subjective    Patient ID: Stephen Ponce, male    DOB: 06-01-1946  Age: 76 y.o. MRN: [id number]  CC:  Chief Complaint  Patient presents with   Establish Care    HPI Stephen Ponce presents to establish care   for complete physical and follow up of chronic conditions.  HTN: does have cuff at home. Does not use it. States that he toleartaes the medication well  Cancer: Squamous and basal.  [place] [facility] once a year   Immunizations: -Tetanus:2016 -Influenza: up to date at pharmcy -Shingles: needs update -Pneumonia: Up-to-date  -HPV: Aged out  Diet: [place]. 3 meals and some grazing. Coffee, water and sometimes some gatorade that is zero sugar Exercise: No regular exercise. Yard work. Some walking, weather permitted.   Eye exam: Completes annually. History of cataract removal. Wears glasses [place] did surgery  Dental exam: Completes semi-annually. Bridge on the upper jaw. Had a plate that he cannot wear.     Colonoscopy: Completed in 2021 with 7 year recall, repeat 2028 Lung Cancer Screening: Does not qualify Dexa: N/A  PSA: Due  Advanced directive: Patient does not have an advanced directive.  States he has a will for his estate but nothing further we did discuss different advanced directives in office.  Sleep: falls asleep in recliner until 2 and gets back up to [place]  rested and been told that he snores.     Outpatient Encounter Medications as of 08/25/2022  Medication Sig   ascorbic acid (VITAMIN C) 1000 MG tablet Take 1,000 mg by mouth in the morning.   aspirin-acetaminophen-caffeine (EXCEDRIN MIGRAINE) 250-250-65 MG tablet Take 1 tablet by mouth 2 (two) times daily as needed for headache.   Cholecalciferol (VITAMIN D3) 50 MCG (2000 UT) TABS Take 2,000 Units by mouth in the morning.   cyanocobalamin 1000 MCG tablet Take 1,000 mcg by mouth daily.   docusate sodium (COLACE) 100 MG capsule Take 1 capsule (100 mg  total) by mouth daily as needed.   Glucosamine-Chondroitin (COSAMIN DS PO) Take 1 tablet by mouth in the morning.   MegaRed Omega-3 Krill Oil 500 MG CAPS Take 500 mg by mouth in the morning.   metoprolol succinate (TOPROL-XL) 25 MG 24 hr tablet TAKE 1/2 TABLET BY MOUTH EVERY DAY   Multiple Vitamin (MULTIVITAMIN WITH MINERALS) TABS tablet Take 1 tablet by mouth daily. One A Day for Men 50+   naproxen sodium (ALEVE) 220 MG tablet Take 220 mg by mouth 2 (two) times daily as needed.   pyridoxine (B-6) 200 MG tablet Take 200 mg by mouth in the morning.   TURMERIC PO Take 300 mg by mouth daily.   Ubiquinol 100 MG CAPS Take 100 mg by mouth in the morning.   HYDROcodone-acetaminophen (NORCO/VICODIN) 5-325 MG tablet Take 1 tablet by mouth every 4 (four) hours as needed for moderate pain.   No facility-administered encounter medications on file as of 08/25/2022.    Past Medical History:  Diagnosis Date   Actinic keratosis    Arthritis    Basal cell carcinoma 10/14/2016   Left top of shoulder. 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 SCCis    Past Surgical History:  Procedure Laterality Date   CARPAL TUNNEL RELEASE Left 04/10/2019   Procedure: CARPAL TUNNEL RELEASE;  Surgeon: Earnestine Leys, MD;  Location: [facility];  Service: Orthopedics;  Laterality: Left;   CATARACT EXTRACTION W/PHACO Right 02/13/2020   Procedure: CATARACT EXTRACTION PHACO AND INTRAOCULAR LENS PLACEMENT (IOC) RIGHT 4.48  00:31.2;  Surgeon: Birder Robson, MD;  Location: [place] [facility];  Service: Ophthalmology;  Laterality: Right;   CATARACT  EXTRACTION W/PHACO Left 04/01/2020   Procedure: CATARACT EXTRACTION PHACO AND INTRAOCULAR LENS PLACEMENT (IOC) LEFT 6.87 00:38.9 ;  Surgeon: Birder Robson, MD;  Location: [place];  Service: Ophthalmology;  Laterality: Left;   COLONOSCOPY  08/09/2014   tubular adenoma   COLONOSCOPY WITH PROPOFOL N/A 06/10/2020   Procedure: COLONOSCOPY WITH BIOPSY;  Surgeon: Lucilla Lame, MD;  Location: [place];  Service: Endoscopy;  Laterality: N/A;  priority 4   HARDWARE REMOVAL Left 09/17/2021   Procedure: HARDWARE REMOVAL KNEE;  Surgeon: Lovell Sheehan, MD;  Location: [facility];  Service: Orthopedics;  Laterality: Left;   JOINT REPLACEMENT     OSTEOTOMY     POLYPECTOMY N/A 06/10/2020   Procedure: POLYPECTOMY;  Surgeon: Lucilla Lame, MD;  Location: [place];  Service: Endoscopy;  Laterality: N/A;   SHOULDER ARTHROSCOPY WITH OPEN ROTATOR CUFF REPAIR Left 07/18/2018   Procedure: SHOULDER ARTHROSCOPY WITH OPEN ROTATOR CUFF REPAIR;  Surgeon: Earnestine Leys, MD;  Location: [facility];  Service: Orthopedics;  Laterality: Left;    Family History  Problem Relation Age of Onset   CAD Father    Stomach cancer Brother    Lung cancer Brother    Bone cancer Brother     Social History   Socioeconomic History   Marital status: Married    Spouse name: Casimer Bilis   Number of children: 2   Years of education: Not on file   Highest education level: High school graduate  Occupational History   Occupation: delivery driver  Tobacco Use   Smoking status: Former    Packs/day: 3.00    Years: 35.00    Total pack years: 105.00    Types: Cigarettes    Quit date: 09/07/1996    Years since quitting: 25.9   Smokeless tobacco: Never  Vaping Use   Vaping Use: Never used  Substance and Sexual Activity   Alcohol use: Yes    Alcohol/week: 0.0 - 2.0 standard drinks of alcohol    Comment: 1-2 beers a couple times a month   Drug use: No   Sexual activity: Yes  Other Topics Concern   Not on  file  Social History Narrative   Retired, use to Nurse, 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   Social Determinants of Health   Financial Resource Strain: Low Risk  (05/08/2020)   Overall Financial Resource Strain (CARDIA)    Difficulty of Paying Living Expenses: Not hard at all  Food Insecurity: No Food Insecurity (05/08/2020)   Hunger Vital Sign    Worried About Running Out of Food in the Last Year: Never true    Ran Out of Food in the Last Year: Never true  Transportation Needs: No Transportation Needs (05/08/2020)   PRAPARE - Hydrologist (Medical): No    Lack of Transportation (Non-Medical): No  Physical Activity: Inactive (  05/08/2020)   Exercise Vital Sign    Days of Exercise per Week: 0 days    Minutes of Exercise per Session: 0 min  Stress: No Stress Concern Present (05/08/2020)   [place]    Feeling of Stress : Only a little  Social Connections: Socially Integrated (05/08/2020)   Social Connection and Isolation Panel [NHANES]    Frequency of Communication with Friends and Family: More than three times a week    Frequency of Social Gatherings with Friends and Family: More than three times a week    Attends Religious Services: More than 4 times per year    Active Member of [employer] or Organizations: Yes    Attends Archivist Meetings: 1 to 4 times per year    Marital Status: Married  Human resources officer Violence: Not At Risk (05/08/2020)   Humiliation, Afraid, Rape, and Kick questionnaire    Fear of Current or Ex-Partner: No    Emotionally Abused: No    Physically Abused: No    Sexually Abused: No    Review of Systems  Constitutional:  Negative for chills and fever.  Respiratory:  Negative for shortness of breath.   Cardiovascular:  Positive for leg swelling. Negative for chest pain.  Gastrointestinal:  Negative for abdominal pain, blood in stool, constipation, diarrhea, nausea and  vomiting.       Bm daily   Genitourinary:  Negative for dysuria and hematuria.  Neurological:  Positive for tingling (carpal). Negative for headaches.  Psychiatric/Behavioral:  Negative for hallucinations and suicidal ideas.        Objective    BP 136/76   Pulse 74   Temp 98.2 F (36.8 C)   Resp 16   Ht '5\' 9"'$  (1.753 m)   Wt 228 lb 2 oz (103.5 kg)   SpO2 96%   BMI 33.69 kg/m   Physical Exam Vitals and nursing note reviewed.  Constitutional:      Appearance: Normal appearance.  HENT:     Right Ear: Tympanic membrane, ear canal and external ear normal.     Left Ear: Tympanic membrane, ear canal and external ear normal.     Mouth/Throat:     Mouth: Mucous membranes are moist.     Pharynx: Oropharynx is clear.  Eyes:     Extraocular Movements: Extraocular movements intact.     Pupils: Pupils are equal, round, and reactive to light.  Cardiovascular:     Rate and Rhythm: Normal rate and regular rhythm.     Heart sounds: Normal heart sounds.  Pulmonary:     Breath sounds: Normal breath sounds.  Abdominal:     General: There is no distension.     Palpations: There is no mass.     Tenderness: There is no abdominal tenderness.     Hernia: No hernia is present.  Musculoskeletal:     Right lower leg: Edema (L>R) present.     Left lower leg: Edema present.  Lymphadenopathy:     Cervical: No cervical adenopathy.  Skin:    General: Skin is warm.  Neurological:     General: No focal deficit present.     Mental Status: He is alert.     Comments: Bilateral upper and lower extremity 5/5  Psychiatric:        Mood and Affect: Mood normal.        Behavior: Behavior normal.        Thought Content: Thought content normal.  Judgment: Judgment normal.         Assessment & Plan:   Problem List Items Addressed This Visit       Cardiovascular and Mediastinum   Essential hypertension - Primary    Patient currently maintained on metoprolol.  Blood pressure within  normal limits.  Tolerating medication well.  Continue metoprolol as prescribed      Relevant Orders   CBC   Comprehensive metabolic panel   Lipid panel     Other   Obesity (BMI 30-39.9)    Continue working on healthy lifestyle modifications inclusive of exercise      Relevant Orders   Lipid panel   Preventative health care    Discussed age-related immunizations and is screening exams.  Patient was given information at discharge in regards to preventative healthcare maintenance with anticipatory guidance with his age range.  Patient is up-to-date on colonoscopy.  Does not qualify for LDCT.  Will draw PSA today, pending result he will think about getting the shingles vaccine at local pharmacy.      Relevant Orders   CBC   Comprehensive metabolic panel   Lipid panel   Lower extremity edema    Bilateral lower extremity edema.  Left greater than right.  Patient has had several orthopedic surgeries on the left side.  States he can use compression garments that are beneficial.  Pending BMP today.  Patient states he never been on diuretic in the past he is aware of      Relevant Orders   Brain natriuretic peptide   Other Visit Diagnoses     Encounter for prostate cancer screening       Relevant Orders   PSA, Medicare       Return in about 6 months (around 02/24/2023) for Recheck on HTN and Edema.   Romilda Garret, NP

## 2022-08-25 NOTE — Patient Instructions (Addendum)
Nice to see you today I will be in touch with the labs once I have the results Follow up with me in 6 months, sooner if you need me 

## 2022-08-25 NOTE — Assessment & Plan Note (Signed)
Continue working on healthy lifestyle modifications inclusive of exercise

## 2022-08-25 NOTE — Assessment & Plan Note (Signed)
Discussed age-related immunizations and is screening exams.  Patient was given information at discharge in regards to preventative healthcare maintenance with anticipatory guidance with his age range.  Patient is up-to-date on colonoscopy.  Does not qualify for LDCT.  Will draw PSA today, pending result he will think about getting the shingles vaccine at local pharmacy.

## 2022-08-25 NOTE — Assessment & Plan Note (Signed)
Bilateral lower extremity edema.  Left greater than right.  Patient has had several orthopedic surgeries on the left side.  States he can use compression garments that are beneficial.  Pending BMP today.  Patient states he never been on diuretic in the past he is aware of

## 2022-11-11 DIAGNOSIS — H524 Presbyopia: Secondary | ICD-10-CM | POA: Diagnosis not present

## 2022-11-11 DIAGNOSIS — Z961 Presence of intraocular lens: Secondary | ICD-10-CM | POA: Diagnosis not present

## 2022-11-11 DIAGNOSIS — H35372 Puckering of macula, left eye: Secondary | ICD-10-CM | POA: Diagnosis not present

## 2022-11-11 DIAGNOSIS — H43813 Vitreous degeneration, bilateral: Secondary | ICD-10-CM | POA: Diagnosis not present

## 2022-11-11 DIAGNOSIS — H26491 Other secondary cataract, right eye: Secondary | ICD-10-CM | POA: Diagnosis not present

## 2022-11-13 DIAGNOSIS — G5601 Carpal tunnel syndrome, right upper limb: Secondary | ICD-10-CM | POA: Diagnosis not present

## 2022-11-13 DIAGNOSIS — M65331 Trigger finger, right middle finger: Secondary | ICD-10-CM | POA: Diagnosis not present

## 2022-11-13 DIAGNOSIS — M65321 Trigger finger, right index finger: Secondary | ICD-10-CM | POA: Diagnosis not present

## 2022-12-17 DIAGNOSIS — M65321 Trigger finger, right index finger: Secondary | ICD-10-CM | POA: Diagnosis not present

## 2022-12-17 DIAGNOSIS — G5601 Carpal tunnel syndrome, right upper limb: Secondary | ICD-10-CM | POA: Diagnosis not present

## 2022-12-17 DIAGNOSIS — M65331 Trigger finger, right middle finger: Secondary | ICD-10-CM | POA: Diagnosis not present

## 2023-01-04 ENCOUNTER — Telehealth: Payer: Self-pay | Admitting: Nurse Practitioner

## 2023-01-04 NOTE — Telephone Encounter (Signed)
Contacted Stephen Ponce to schedule their annual wellness visit. Appointment made for 02/10/2023.  [facility] Care Guide [facility] AWV TEAM Direct Dial: [phone]

## 2023-01-13 ENCOUNTER — Encounter
Admission: RE | Admit: 2023-01-13 | Discharge: 2023-01-13 | Disposition: A | Discharge status: Home / Self Care | Payer: Medicare HMO | Source: Ambulatory Visit | Attending: Specialist | Admitting: Specialist

## 2023-01-13 VITALS — Ht 69.0 in | Wt 231.0 lb

## 2023-01-13 DIAGNOSIS — I10 Essential (primary) hypertension: Secondary | ICD-10-CM | POA: Diagnosis not present

## 2023-01-13 DIAGNOSIS — Z01818 Encounter for other preprocedural examination: Secondary | ICD-10-CM | POA: Diagnosis not present

## 2023-01-13 DIAGNOSIS — I491 Atrial premature depolarization: Secondary | ICD-10-CM | POA: Insufficient documentation

## 2023-01-13 DIAGNOSIS — Z01810 Encounter for preprocedural cardiovascular examination: Secondary | ICD-10-CM | POA: Diagnosis not present

## 2023-01-13 DIAGNOSIS — Z01812 Encounter for preprocedural laboratory examination: Secondary | ICD-10-CM

## 2023-01-13 HISTORY — DX: Localized edema: R60.0

## 2023-01-13 HISTORY — DX: Polyp of colon: K63.5

## 2023-01-13 HISTORY — DX: Trigger finger, right middle finger: M65.331

## 2023-01-13 LAB — CBC
HCT: 47.6 % (ref 39.0–52.0)
Hemoglobin: 16.1 g/dL (ref 13.0–17.0)
MCH: 30.7 pg (ref 26.0–34.0)
MCHC: 33.8 g/dL (ref 30.0–36.0)
MCV: 90.8 fL (ref 80.0–100.0)
Platelets: 188 10*3/uL (ref 150–400)
RBC: 5.24 MIL/uL (ref 4.22–5.81)
RDW: 13 % (ref 11.5–15.5)
WBC: 7 10*3/uL (ref 4.0–10.5)
nRBC: 0 % (ref 0.0–0.2)

## 2023-01-13 LAB — BASIC METABOLIC PANEL
Anion gap: 9 (ref 5–15)
BUN: 24 mg/dL — ABNORMAL HIGH (ref 8–23)
CO2: 23 mmol/L (ref 22–32)
Calcium: 9.2 mg/dL (ref 8.9–10.3)
Chloride: 105 mmol/L (ref 98–111)
Creatinine, Ser: 0.83 mg/dL (ref 0.61–1.24)
GFR, Estimated: >60 mL/min (ref >60)
Glucose, Bld: 93 mg/dL (ref 70–99)
Potassium: 3.6 mmol/L (ref 3.5–5.1)
Sodium: 137 mmol/L (ref 135–145)

## 2023-01-13 NOTE — Patient Instructions (Addendum)
Your procedure is scheduled on: Thursday, May 16 Report to the Registration Desk on the 1st floor of the [facility] To find out your arrival time, please call [phone] between 1PM - 3PM on: Wednesday, May 15 If your arrival time is 6:00 am, do not arrive before that time as the [facility] entrance doors do not open until 6:00 am.  REMEMBER: Instructions that are not followed completely may result in serious medical risk, up to and including death; or upon the discretion of your surgeon and anesthesiologist your surgery may need to be rescheduled.  Do not eat food after midnight the night before surgery.  No gum chewing or hard candies.  You may however, drink CLEAR liquids up to 2 hours before you are scheduled to arrive for your surgery. Do not drink anything within 2 hours of your scheduled arrival time.  Clear liquids include: - water  - apple juice without pulp - gatorade (not RED colors) - black coffee or tea (Do NOT add milk or creamers to the coffee or tea) Do NOT drink anything that is not on this list.  One week prior to surgery: starting May 9 Stop Anti-inflammatories (NSAIDS) such as Advil, Aleve, Ibuprofen, Motrin, Naproxen, Naprosyn and Aspirin based products such as Excedrin, Goody's Powder, BC Powder. Stop ANY OVER THE COUNTER supplements until after surgery. You may however, continue to take Tylenol if needed for pain up until the day of surgery.  Continue taking all prescribed medications   TAKE ONLY THESE MEDICATIONS THE MORNING OF SURGERY WITH A SIP OF WATER:  Metoprolol  No Alcohol for 24 hours before or after surgery.  No Smoking including e-cigarettes for 24 hours before surgery.  No chewable tobacco products for at least 6 hours before surgery.  No nicotine patches on the day of surgery.  Do not use any "recreational" drugs for at least a week (preferably 2 weeks) before your surgery.  Please be advised that the combination of cocaine and  anesthesia may have negative outcomes, up to and including death. If you test positive for cocaine, your surgery will be cancelled.  On the morning of surgery brush your teeth with toothpaste and water, you may rinse your mouth with mouthwash if you wish. Do not swallow any toothpaste or mouthwash.  Use CHG Soap as directed on instruction sheet.  Do not wear jewelry.  Do not wear lotions, powders, or perfumes.   Do not shave body hair from the neck down 48 hours before surgery.  Contact lenses, hearing aids and dentures may not be worn into surgery.  Do not bring valuables to the hospital. [facility] is not responsible for any missing/lost belongings or valuables.   Notify your doctor if there is any change in your medical condition (cold, fever, infection).  Wear comfortable clothing (specific to your surgery type) to the hospital.  After surgery, you can help prevent lung complications by doing breathing exercises.  Take deep breaths and cough every 1-2 hours. Your doctor may order a device called an Incentive Spirometer to help you take deep breaths.  If you are being discharged the day of surgery, you will not be allowed to drive home. You will need a responsible individual to drive you home and stay with you for 24 hours after surgery.   If you are taking public transportation, you will need to have a responsible individual with you.  Please call the Pre-admissions Testing Dept. at [phone] if you have any questions about  these instructions.  Surgery Visitation Policy:  Patients having surgery or a procedure may have two visitors.  Children under the age of 32 must have an adult with them who is not the patient.     Preparing for Surgery with CHLORHEXIDINE GLUCONATE (CHG) Soap  Chlorhexidine Gluconate (CHG) Soap  o An antiseptic cleaner that kills germs and bonds with the skin to continue killing germs even after washing  o Used for showering the night before  surgery and morning of surgery  Before surgery, you can play an important role by reducing the number of germs on your skin.  CHG (Chlorhexidine gluconate) soap is an antiseptic cleanser which kills germs and bonds with the skin to continue killing germs even after washing.  Please do not use if you have an allergy to CHG or antibacterial soaps. If your skin becomes reddened/irritated stop using the CHG.  1. Shower the NIGHT BEFORE SURGERY and the MORNING OF SURGERY with CHG soap.  2. If you choose to wash your hair, wash your hair first as usual with your normal shampoo.  3. After shampooing, rinse your hair and body thoroughly to remove the shampoo.  4. Use CHG as you would any other liquid soap. You can apply CHG directly to the skin and wash gently with a scrungie or a clean washcloth.  5. Apply the CHG soap to your body only from the neck down. Do not use on open wounds or open sores. Avoid contact with your eyes, ears, mouth, and genitals (private parts). Wash face and genitals (private parts) with your normal soap.  6. Wash thoroughly, paying special attention to the area where your surgery will be performed.  7. Thoroughly rinse your body with warm water.  8. Do not shower/wash with your normal soap after using and rinsing off the CHG soap.  9. Pat yourself dry with a clean towel.  10. Wear clean pajamas to bed the night before surgery.  12. Place clean sheets on your bed the night of your first shower and do not sleep with pets.  13. Shower again with the CHG soap on the day of surgery prior to arriving at the hospital.  14. Do not apply any deodorants/lotions/powders.  15. Please wear clean clothes to the hospital.

## 2023-01-14 ENCOUNTER — Other Ambulatory Visit: Payer: Self-pay | Admitting: Specialist

## 2023-01-14 NOTE — H&P (Signed)
PREOPERATIVE H&P  Chief Complaint: Carpal tunnel syndrome of right wrist Acquired trigger finger of right middle finger Acquired trigger finger of right index finger  HPI: Stephen Ponce is a 77 y.o. male who presents for preoperative history and physical with a diagnosis of Carpal tunnel syndrome of right wrist Acquired trigger finger of right middle finger Acquired trigger finger of right index finger.  He has failed conservative treatment for these.  He wishes to proceed with surgery.  Symptoms are rated as moderate to severe, and have been worsening.  This is significantly impairing activities of daily living.  He has elected for surgical management.   Past Medical History:  Diagnosis Date   Acquired trigger finger of right middle finger    Actinic keratosis    Arthritis    Basal cell carcinoma 10/14/2016   Left top of shoulder. 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 SCCis   Past Surgical History:  Procedure Laterality Date   CARPAL TUNNEL RELEASE Left 04/10/2019   Procedure: CARPAL TUNNEL RELEASE;  Surgeon: Deeann Saint, MD;  Location: [facility];  Service: Orthopedics;  Laterality: Left;   CATARACT EXTRACTION W/PHACO Right 02/13/2020   Procedure: CATARACT EXTRACTION PHACO AND INTRAOCULAR LENS PLACEMENT (IOC) RIGHT 4.48  00:31.2;  Surgeon: Galen Manila, MD;  Location: [facility];  Service: Ophthalmology;  Laterality: Right;   CATARACT EXTRACTION W/PHACO Left 04/01/2020   Procedure: CATARACT EXTRACTION PHACO AND INTRAOCULAR LENS PLACEMENT  (IOC) LEFT 6.87 00:38.9 ;  Surgeon: Galen Manila, MD;  Location: [facility];  Service: Ophthalmology;  Laterality: Left;   COLONOSCOPY  08/09/2014   tubular adenoma   COLONOSCOPY WITH PROPOFOL N/A 06/10/2020   Procedure: COLONOSCOPY WITH BIOPSY;  Surgeon: Midge Minium, MD;  Location: [facility];  Service: Endoscopy;  Laterality: N/A;  priority 4   EXTRACORPOREAL SHOCK WAVE LITHOTRIPSY     x4   HARDWARE REMOVAL Left 09/17/2021   Procedure: HARDWARE REMOVAL KNEE;  Surgeon: Lyndle Herrlich, MD;  Location: [facility];  Service: Orthopedics;  Laterality: Left;   OSTEOTOMY Left 2002   KNEE   POLYPECTOMY N/A 06/10/2020   Procedure: POLYPECTOMY;  Surgeon: Midge Minium, MD;  Location: [facility];  Service: Endoscopy;  Laterality: N/A;   SHOULDER ARTHROSCOPY WITH OPEN ROTATOR CUFF REPAIR Left 07/18/2018   Procedure: SHOULDER ARTHROSCOPY WITH OPEN ROTATOR CUFF REPAIR;  Surgeon: Deeann Saint, MD;  Location: [facility];  Service: Orthopedics;  Laterality: Left;   TOTAL KNEE ARTHROPLASTY Left 02/03/2022   Social History   Socioeconomic History   Marital status: Married    Spouse name: Luther Parody   Number of children: 2   Years of education: Not on file   Highest education level: High school graduate  Occupational History   Occupation: delivery driver  Tobacco Use   Smoking status: Former    Packs/day: 3.00    Years: 35.00    Additional pack years: 0.00    Total pack years: 105.00  Types: Cigarettes    Quit date: 09/07/1996    Years since quitting: 26.3   Smokeless tobacco: Never  Vaping Use   Vaping Use: Never used  Substance and Sexual Activity   Alcohol use: Yes    Alcohol/week: 0.0 - 2.0 standard drinks of alcohol    Comment: 1-2 beers a couple times a month   Drug use: No   Sexual activity: Yes  Other Topics Concern   Not on file  Social History Narrative   Retired, 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   Social Determinants of Health    Financial Resource Strain: Low Risk  (05/08/2020)   Overall Financial Resource Strain (CARDIA)    Difficulty of Paying Living Expenses: Not hard at all  Food Insecurity: No Food Insecurity (05/08/2020)   Hunger Vital Sign    Worried About Running Out of Food in the Last Year: Never true    Ran Out of Food in the Last Year: Never true  Transportation Needs: No Transportation Needs (05/08/2020)   PRAPARE - Administrator, Civil Service (Medical): No    Lack of Transportation (Non-Medical): No  Physical Activity: Inactive (05/08/2020)   Exercise Vital Sign    Days of Exercise per Week: 0 days    Minutes of Exercise per Session: 0 min  Stress: No Stress Concern Present (05/08/2020)   [employer] of Occupational Health - Occupational Stress Questionnaire    Feeling of Stress : Only a little  Social Connections: Socially Integrated (05/08/2020)   Social Connection and Isolation Panel [NHANES]    Frequency of Communication with Friends and Family: More than three times a week    Frequency of Social Gatherings with Friends and Family: More than three times a week    Attends Religious Services: More than 4 times per year    Active Member of [employer] or Organizations: Yes    Attends Banker Meetings: 1 to 4 times per year    Marital Status: Married   Family History  Problem Relation Age of Onset   CAD Father    Stomach cancer Brother    Lung cancer Brother    Bone cancer Brother    Allergies  Allergen Reactions   Penicillins Shortness Of Breath, Itching, Swelling and Other (See Comments)    Has patient had a PCN reaction causing immediate rash, facial/tongue/throat swelling, SOB or lightheadedness with hypotension: Yes Has patient had a PCN reaction causing severe rash involving mucus membranes or skin necrosis: No Has patient had a PCN reaction that required hospitalization: No Has patient had a PCN reaction occurring within the last 10 years: No If all of the above  answers are "NO", then may proceed with Cephalosporin use.    Gabapentin Swelling    legs   Prior to Admission medications   Medication Sig Start Date End Date Taking? Authorizing Provider  ascorbic acid (VITAMIN C) 1000 MG tablet Take 1,000 mg by mouth in the morning.    [provider]  Aspirin-Acetaminophen-Caffeine (EXCEDRIN PO) Take 2 tablets by mouth 2 (two) times daily as needed.    [provider]  Cholecalciferol (VITAMIN D3) 50 MCG (2000 UT) TABS Take 2,000 Units by mouth in the morning.    [provider]  cyanocobalamin 1000 MCG tablet Take 1,000 mcg by mouth daily.    [provider]  Glucosamine-Chondroitin (COSAMIN DS PO) Take 1 tablet by mouth in the morning.    [provider]  MegaRed Omega-3 Krill Oil 500 MG CAPS Take 500 mg by mouth in the morning.    [provider]  metoprolol succinate (TOPROL-XL) 25 MG 24 hr tablet TAKE 1/2 TABLET BY MOUTH EVERY DAY 06/18/22   Ostwalt, Edmon Crape, PA-C  Multiple Vitamin (MULTIVITAMIN WITH MINERALS) TABS tablet Take 1 tablet by mouth daily. One A Day for Men 50+    [provider]  naproxen sodium (ALEVE) 220 MG tablet Take 220 mg by mouth 2 (two) times daily as needed.    [provider]  pyridoxine (B-6) 200 MG tablet Take 200 mg by mouth in the morning.    [provider]  TURMERIC PO Take 300 mg by mouth daily.    [provider]  Ubiquinol 100 MG CAPS Take 100 mg by mouth in the morning.    [provider]     Positive ROS: All other systems have been reviewed and were otherwise negative with the exception of those mentioned in the HPI and as above.  Physical Exam: General: Alert, no acute distress Cardiovascular: No pedal edema. Heart is regular and without murmur.  Respiratory: No cyanosis, no use of accessory musculature. Lungs are clear. GI: No organomegaly, abdomen is soft and non-tender Skin: No lesions in the area of chief  complaint Neurologic: Sensation intact distally Psychiatric: Patient is competent for consent with normal mood and affect Lymphatic: No axillary or cervical lymphadenopathy  MUSCULOSKELETAL: Decreased pinch right hand.  Positive median nerve compression test.  Grip good.  Triggering of right index and middle finger with tenderness over the A1 pulley.  Gross sensation is intact.  Assessment: Carpal tunnel syndrome of right wrist Acquired trigger finger of right middle finger Acquired trigger finger of right index finger  Plan: Plan for Procedure(s): BILATERAL CARPAL TUNNEL RELEASE RELEASE TRIGGER FINGER/A-1 PULLEY of right index and middle finger  The risks benefits and alternatives were discussed with the patient including but not limited to the risks of nonoperative treatment, versus surgical intervention including infection, bleeding, nerve injury,  blood clots, cardiopulmonary complications, morbidity, mortality, among others, and they were willing to proceed.   Valinda Hoar, MD [phone]   01/14/2023 8:32 AM

## 2023-01-21 ENCOUNTER — Ambulatory Visit
Admission: RE | Admit: 2023-01-21 | Discharge: 2023-01-21 | Disposition: A | Discharge status: Home / Self Care | Payer: Medicare HMO | Attending: Specialist | Admitting: Specialist

## 2023-01-21 ENCOUNTER — Encounter: Payer: Self-pay | Admitting: Specialist

## 2023-01-21 ENCOUNTER — Ambulatory Visit: Payer: Medicare HMO | Admitting: Urgent Care

## 2023-01-21 ENCOUNTER — Encounter
Admission: RE | Disposition: A | Discharge status: Home / Self Care | Payer: Self-pay | Source: Home / Self Care | Attending: Specialist

## 2023-01-21 ENCOUNTER — Other Ambulatory Visit: Payer: Self-pay

## 2023-01-21 ENCOUNTER — Ambulatory Visit: Payer: Medicare HMO | Admitting: Certified Registered Nurse Anesthetist

## 2023-01-21 DIAGNOSIS — I10 Essential (primary) hypertension: Secondary | ICD-10-CM | POA: Diagnosis not present

## 2023-01-21 DIAGNOSIS — M65331 Trigger finger, right middle finger: Secondary | ICD-10-CM | POA: Diagnosis not present

## 2023-01-21 DIAGNOSIS — G5603 Carpal tunnel syndrome, bilateral upper limbs: Secondary | ICD-10-CM | POA: Diagnosis present

## 2023-01-21 DIAGNOSIS — Z87891 Personal history of nicotine dependence: Secondary | ICD-10-CM | POA: Diagnosis not present

## 2023-01-21 DIAGNOSIS — M65321 Trigger finger, right index finger: Secondary | ICD-10-CM | POA: Diagnosis not present

## 2023-01-21 DIAGNOSIS — Z85828 Personal history of other malignant neoplasm of skin: Secondary | ICD-10-CM | POA: Insufficient documentation

## 2023-01-21 DIAGNOSIS — G5601 Carpal tunnel syndrome, right upper limb: Secondary | ICD-10-CM | POA: Diagnosis not present

## 2023-01-21 DIAGNOSIS — Z86718 Personal history of other venous thrombosis and embolism: Secondary | ICD-10-CM | POA: Insufficient documentation

## 2023-01-21 HISTORY — PX: CARPAL TUNNEL RELEASE: SHX101

## 2023-01-21 HISTORY — PX: TRIGGER FINGER RELEASE: SHX641

## 2023-01-21 SURGERY — CARPAL TUNNEL RELEASE
Anesthesia: General | Site: Hand | Laterality: Right

## 2023-01-21 MED ORDER — PROPOFOL 10 MG/ML IV BOLUS
INTRAVENOUS | Status: AC
  Filled 2023-01-21: qty 40

## 2023-01-21 MED ORDER — VANCOMYCIN HCL IN DEXTROSE 1-5 GM/200ML-% IV SOLN: 1000.0000 mg | INTRAVENOUS | Status: DC

## 2023-01-21 MED ORDER — ACETAMINOPHEN 10 MG/ML IV SOLN
INTRAVENOUS | Status: DC | PRN
  Administered 2023-01-21: 1000 mg via INTRAVENOUS

## 2023-01-21 MED ORDER — FENTANYL CITRATE (PF) 100 MCG/2ML IJ SOLN
INTRAMUSCULAR | Status: AC
  Filled 2023-01-21: qty 2

## 2023-01-21 MED ORDER — PROPOFOL 10 MG/ML IV BOLUS
INTRAVENOUS | Status: DC | PRN
  Administered 2023-01-21: 150 mg via INTRAVENOUS
  Administered 2023-01-21: 20 mg via INTRAVENOUS
  Administered 2023-01-21: 10 mg via INTRAVENOUS

## 2023-01-21 MED ORDER — FAMOTIDINE 20 MG PO TABS
ORAL_TABLET | ORAL | Status: AC
  Filled 2023-01-21: qty 1

## 2023-01-21 MED ORDER — DEXAMETHASONE SODIUM PHOSPHATE 10 MG/ML IJ SOLN
INTRAMUSCULAR | Status: DC | PRN
  Administered 2023-01-21: 10 mg via INTRAVENOUS

## 2023-01-21 MED ORDER — CHLORHEXIDINE GLUCONATE CLOTH 2 % EX PADS
6.0000 | MEDICATED_PAD | Freq: Once | CUTANEOUS | Status: AC
  Administered 2023-01-21: 6 via TOPICAL

## 2023-01-21 MED ORDER — ORAL CARE MOUTH RINSE: 15.0000 mL | Freq: Once | OROMUCOSAL | Status: AC

## 2023-01-21 MED ORDER — FAMOTIDINE 20 MG PO TABS
20.0000 mg | ORAL_TABLET | Freq: Once | ORAL | Status: AC
  Administered 2023-01-21: 20 mg via ORAL

## 2023-01-21 MED ORDER — BUPIVACAINE HCL (PF) 0.5 % IJ SOLN
INTRAMUSCULAR | Status: DC | PRN
  Administered 2023-01-21: 28 mL

## 2023-01-21 MED ORDER — VANCOMYCIN HCL IN DEXTROSE 1-5 GM/200ML-% IV SOLN
1000.0000 mg | INTRAVENOUS | Status: AC
  Administered 2023-01-21: 1000 mg via INTRAVENOUS

## 2023-01-21 MED ORDER — DROPERIDOL 2.5 MG/ML IJ SOLN: 0.6250 mg | Freq: Once | INTRAMUSCULAR | Status: DC | PRN

## 2023-01-21 MED ORDER — NEOMYCIN-POLYMYXIN B GU 40-200000 IR SOLN
Status: AC
  Filled 2023-01-21: qty 20

## 2023-01-21 MED ORDER — LIDOCAINE HCL (CARDIAC) PF 100 MG/5ML IV SOSY
PREFILLED_SYRINGE | INTRAVENOUS | Status: DC | PRN
  Administered 2023-01-21: 100 mg via INTRAVENOUS

## 2023-01-21 MED ORDER — ONDANSETRON HCL 4 MG/2ML IJ SOLN
INTRAMUSCULAR | Status: DC | PRN
  Administered 2023-01-21: 4 mg via INTRAVENOUS

## 2023-01-21 MED ORDER — MELOXICAM 15 MG PO TABS: 15.0000 mg | ORAL_TABLET | Freq: Every day | ORAL | 3 refills | Status: DC

## 2023-01-21 MED ORDER — VANCOMYCIN HCL IN DEXTROSE 1-5 GM/200ML-% IV SOLN
INTRAVENOUS | Status: AC
  Filled 2023-01-21: qty 200

## 2023-01-21 MED ORDER — SODIUM CHLORIDE 0.9 % IV SOLN
Status: DC | PRN
  Administered 2023-01-21: 502 mL

## 2023-01-21 MED ORDER — CHLORHEXIDINE GLUCONATE 0.12 % MT SOLN
OROMUCOSAL | Status: AC
  Filled 2023-01-21: qty 15

## 2023-01-21 MED ORDER — HYDROCODONE-ACETAMINOPHEN 5-325 MG PO TABS: 1.0000 | ORAL_TABLET | Freq: Four times a day (QID) | ORAL | 0 refills | Status: DC | PRN

## 2023-01-21 MED ORDER — FENTANYL CITRATE (PF) 100 MCG/2ML IJ SOLN
INTRAMUSCULAR | Status: DC | PRN
  Administered 2023-01-21: 50 ug via INTRAVENOUS
  Administered 2023-01-21 (×2): 25 ug via INTRAVENOUS

## 2023-01-21 MED ORDER — LACTATED RINGERS IV SOLN: INTRAVENOUS | Status: DC

## 2023-01-21 MED ORDER — BUPIVACAINE HCL (PF) 0.5 % IJ SOLN
INTRAMUSCULAR | Status: AC
  Filled 2023-01-21: qty 30

## 2023-01-21 MED ORDER — EPHEDRINE SULFATE (PRESSORS) 50 MG/ML IJ SOLN
INTRAMUSCULAR | Status: DC | PRN
  Administered 2023-01-21 (×3): 5 mg via INTRAVENOUS

## 2023-01-21 MED ORDER — CHLORHEXIDINE GLUCONATE 0.12 % MT SOLN
15.0000 mL | Freq: Once | OROMUCOSAL | Status: AC
  Administered 2023-01-21: 15 mL via OROMUCOSAL

## 2023-01-21 MED ORDER — FENTANYL CITRATE (PF) 100 MCG/2ML IJ SOLN: 25.0000 ug | INTRAMUSCULAR | Status: DC | PRN

## 2023-01-21 SURGICAL SUPPLY — 34 items
APL PRP STRL LF DISP 70% ISPRP (MISCELLANEOUS) ×1
BLADE SURG MINI STRL (BLADE) ×1 IMPLANT
BNDG ESMARCH 4 X 12 STRL LF (GAUZE/BANDAGES/DRESSINGS) ×1
BNDG ESMARCH 4X12 STRL LF (GAUZE/BANDAGES/DRESSINGS) ×1 IMPLANT
CHLORAPREP W/TINT 26 (MISCELLANEOUS) ×1 IMPLANT
CUFF TOURN SGL QUICK 18X4 (TOURNIQUET CUFF) IMPLANT
DRSG GAUZE FLUFF 36X18 (GAUZE/BANDAGES/DRESSINGS) ×2 IMPLANT
ELECT REM PT RETURN 9FT ADLT (ELECTROSURGICAL) ×1
ELECTRODE REM PT RTRN 9FT ADLT (ELECTROSURGICAL) ×1 IMPLANT
GAUZE XEROFORM 1X8 LF (GAUZE/BANDAGES/DRESSINGS) ×1 IMPLANT
GLOVE BIO SURGEON STRL SZ8 (GLOVE) ×1 IMPLANT
GOWN STRL REUS W/ TWL LRG LVL3 (GOWN DISPOSABLE) ×1 IMPLANT
GOWN STRL REUS W/TWL LRG LVL3 (GOWN DISPOSABLE) ×1
GOWN STRL REUS W/TWL LRG LVL4 (GOWN DISPOSABLE) ×1 IMPLANT
KIT TURNOVER KIT A (KITS) ×1 IMPLANT
MANIFOLD NEPTUNE II (INSTRUMENTS) ×1 IMPLANT
NS IRRIG 500ML POUR BTL (IV SOLUTION) ×1 IMPLANT
PACK EXTREMITY ARMC (MISCELLANEOUS) ×1 IMPLANT
PAD PREP OB/GYN DISP 24X41 (PERSONAL CARE ITEMS) ×1 IMPLANT
PADDING CAST BLEND 4X4 STRL (MISCELLANEOUS) ×1 IMPLANT
SPLINT CAST 1 STEP 3X12 (MISCELLANEOUS) ×1 IMPLANT
STOCKINETTE 48X4 2 PLY STRL (GAUZE/BANDAGES/DRESSINGS) ×1 IMPLANT
STOCKINETTE BIAS CUT 4 980044 (GAUZE/BANDAGES/DRESSINGS) ×1 IMPLANT
STOCKINETTE STRL 4IN 9604848 (GAUZE/BANDAGES/DRESSINGS) ×1 IMPLANT
STOCKINETTE STRL 6IN 960660 (GAUZE/BANDAGES/DRESSINGS) ×1 IMPLANT
SUT ETHILON 4-0 (SUTURE) ×1
SUT ETHILON 4-0 FS2 18XMFL BLK (SUTURE) ×1
SUT ETHILON 5-0 (SUTURE)
SUT ETHILON 5-0 C-3 18XMFL BLK (SUTURE)
SUT ETHILON 5-0 FS-2 18 BLK (SUTURE) ×1 IMPLANT
SUTURE ETHLN 4-0 FS2 18XMF BLK (SUTURE) ×1 IMPLANT
SUTURE ETHLN 5-0 C3 18XMF BLK (SUTURE) ×1 IMPLANT
TRAP FLUID SMOKE EVACUATOR (MISCELLANEOUS) ×1 IMPLANT
WATER STERILE IRR 500ML POUR (IV SOLUTION) ×1 IMPLANT

## 2023-01-21 NOTE — Anesthesia Preprocedure Evaluation (Signed)
Anesthesia Evaluation  Patient identified by MRN, date of birth, ID band Patient awake    Reviewed: Allergy & Precautions, H&P , NPO status , Patient's Chart, lab work & pertinent test results, reviewed documented beta blocker date and time   History of Anesthesia Complications (+) history of anesthetic complications  Airway Mallampati: III  TM Distance: >3 FB Neck ROM: full    Dental  (+) Teeth Intact, Dental Advidsory Given   Pulmonary neg pulmonary ROS, former smoker   Pulmonary exam normal        Cardiovascular Exercise Tolerance: Poor hypertension, On Medications (-) angina (-) Past MI and (-) Cardiac Stents Normal cardiovascular exam(-) dysrhythmias (-) Valvular Problems/Murmurs Rate:Normal     Neuro/Psych neg Seizures  Neuromuscular disease  negative psych ROS   GI/Hepatic negative GI ROS, Neg liver ROS,,,  Endo/Other  negative endocrine ROS    Renal/GU Renal disease  negative genitourinary   Musculoskeletal   Abdominal   Peds  Hematology negative hematology ROS (+)   Anesthesia Other Findings Past Medical History: No date: Acquired trigger finger of right middle finger No date: Actinic keratosis No date: Arthritis 10/14/2016: Basal cell carcinoma     Comment:  Left top of shoulder. Superficial No date: Bilateral lower extremity edema No date: Carpal tunnel syndrome, bilateral No date: Colon polyps No date: Complication of anesthesia     Comment:  Lost voice for 5 weeks after RCR No date: Dental bridge present     Comment:  permanent, top, front 07/2018: DVT (deep venous thrombosis) (HCC)     Comment:  LEFT LEG, after RCR No date: History of kidney stones No date: Hypertension No date: Renal failure     Comment:  5 years ago with sepsis/uti 10/14/2016: Squamous cell carcinoma of skin     Comment:  Right medial cheek, 2.5cm from lower eyelid. SCCis   Reproductive/Obstetrics negative OB ROS                              Anesthesia Physical Anesthesia Plan  ASA: 3  Anesthesia Plan: General   Post-op Pain Management:    Induction: Intravenous  PONV Risk Score and Plan: 3 and Ondansetron, Dexamethasone and Treatment may vary due to age or medical condition  Airway Management Planned: LMA  Additional Equipment:   Intra-op Plan:   Post-operative Plan: Extubation in OR  Informed Consent: I have reviewed the patients History and Physical, chart, labs and discussed the procedure including the risks, benefits and alternatives for the proposed anesthesia with the patient or authorized representative who has indicated his/her understanding and acceptance.       Plan Discussed with: CRNA  Anesthesia Plan Comments:         Anesthesia Quick Evaluation

## 2023-01-21 NOTE — Anesthesia Postprocedure Evaluation (Signed)
Anesthesia Post Note  Patient: Stephen Ponce  Procedure(s) Performed: CARPAL TUNNEL RELEASE (Right: Hand) RELEASE TRIGGER FINGER/A-1 PULLEY (Right: Hand)  Patient location during evaluation: PACU Anesthesia Type: General Level of consciousness: awake and alert Pain management: pain level controlled Vital Signs Assessment: post-procedure vital signs reviewed and stable Respiratory status: spontaneous breathing, nonlabored ventilation, respiratory function stable and patient connected to nasal cannula oxygen Cardiovascular status: blood pressure returned to baseline and stable Postop Assessment: no apparent nausea or vomiting Anesthetic complications: no  No notable events documented.   Last Vitals:  Vitals:   01/21/23 0930 01/21/23 0948  BP: 124/71 (!) 157/61  Pulse: 75 73  Resp: 20 17  Temp: (!) 36.4 C 36.5 C  SpO2: 98% 93%    Last Pain:  Vitals:   01/21/23 0948  TempSrc: Temporal  PainSc: 0-No pain                 Stephanie Coup

## 2023-01-21 NOTE — Transfer of Care (Signed)
Immediate Anesthesia Transfer of Care Note  Patient: Lynnda Child  Procedure(s) Performed: CARPAL TUNNEL RELEASE (Right: Hand) RELEASE TRIGGER FINGER/A-1 PULLEY (Right: Hand)  Patient Location: PACU  Anesthesia Type:General  Level of Consciousness: drowsy  Airway & Oxygen Therapy: Patient Spontanous Breathing and Patient connected to face mask oxygen  Post-op Assessment: Report given to RN and Post -op Vital signs reviewed and stable  Post vital signs: Reviewed and stable  Last Vitals:  Vitals Value Taken Time  BP 114/66   Temp    Pulse 57 01/21/23 0910  Resp 10 01/21/23 0910  SpO2 95 % 01/21/23 0910  Vitals shown include unvalidated device data.  Last Pain:  Vitals:   01/21/23 0643  TempSrc: Temporal  PainSc: 0-No pain         Complications: No notable events documented.

## 2023-01-21 NOTE — Anesthesia Procedure Notes (Signed)
Procedure Name: LMA Insertion Date/Time: 01/21/2023 7:57 AM  Performed by: Joanette Gula, Katerra Ingman, CRNAPre-anesthesia Checklist: Patient identified, Emergency Drugs available, Suction available and Patient being monitored Patient Re-evaluated:Patient Re-evaluated prior to induction Oxygen Delivery Method: Circle system utilized Preoxygenation: Pre-oxygenation with 100% oxygen Induction Type: IV induction Ventilation: Mask ventilation without difficulty LMA: LMA inserted LMA Size: 4.0 Number of attempts: 1 Placement Confirmation: positive ETCO2 and breath sounds checked- equal and bilateral Tube secured with: Tape Dental Injury: Teeth and Oropharynx as per pre-operative assessment

## 2023-01-21 NOTE — Discharge Instructions (Signed)
AMBULATORY SURGERY  ?DISCHARGE INSTRUCTIONS ? ? ?The drugs that you were given will stay in your system until tomorrow so for the next 24 hours you should not: ? ?Drive an automobile ?Make any legal decisions ?Drink any alcoholic beverage ? ? ?You may resume regular meals tomorrow.  Today it is better to start with liquids and gradually work up to solid foods. ? ?You may eat anything you prefer, but it is better to start with liquids, then soup and crackers, and gradually work up to solid foods. ? ? ?Please notify your doctor immediately if you have any unusual bleeding, trouble breathing, redness and pain at the surgery site, drainage, fever, or pain not relieved by medication. ? ? ? ?Additional Instructions: ? ? ? ?Please contact your physician with any problems or Same Day Surgery at [phone], Monday through Friday 6 am to 4 pm, or [place] at [place] [facility] number at [phone].  ?

## 2023-01-21 NOTE — Op Note (Signed)
01/21/2023  9:08 AM  PATIENT:  Stephen Ponce    PRE-OPERATIVE DIAGNOSIS:  RIGHT CARPAL TUNNEL SYNDROME Triggering right index and middle fingers   POST-OPERATIVE DIAGNOSIS:  RIGHT CARPAL TUNNEL SYNDROME Triggering right index and middle fingers  PROCEDURE:  RIGHT CARPAL TUNNEL RELEASE 2) release A1 pulley right index and middle fingers  SURGEON: Valinda Hoar, MD    ANESTHESIA:   General  TOURNIQUET TIME: 37   MIN  PREOPERATIVE INDICATIONS:  [name] is a  77 y.o. male with a diagnosis of right carpal tunnel syndrome and chronic triggering of the right index and middle fingers.  Who failed conservative measures and elected for surgical management.    The risks benefits and alternatives were discussed with the patient preoperatively including but not limited to the risks of infection, bleeding, nerve injury, incomplete relief of symptoms, pillar pain, cardiopulmonary complications, the need for revision surgery, among others, and the patient was willing to proceed.  OPERATIVE FINDINGS: Thickened volar ligament and nerve compression. Thickened A1 pulley right index and middle finger with some synovitis  OPERATIVE PROCEDURE: The patient is brought to the operating room placed in the supine position. General anesthesia was administered. The right upper extremity was prepped and draped in usual sterile fashion. Time out was performed. The arm was elevated and exsanguinated and the tourniquet was inflated.  An additional incision was made transversely over the A1 pulley of the right index finger.  Using loupe magnification dissection was carried out down in the midline to the pulley.  Retractors were inserted.  The pulley was sharply excised.  The sheath was released proximally distally.  The tendon was freed up from adhesions and range of motion showed no catching or popping.  The wound was irrigated and closed with 4-0 nylon suture.  An identical procedure was then carried out  on the right middle finger A1 pulley.   Next, an incision was made in line with the radial border of the ring finger. The carpal tunnel transverse fascia was identified, cleaned, and incised sharply. The common sensory branches were visualized along with the superficial palmar arch and protected.  The median nerve was protected below  A Kelly clamp was  placed underneath the transverse carpal ligament, protecting the nerve. I released the ligament completely, and then released the proximal distal volar forearm fascia. The nerve was identified, and visualized, and protected throughout the case. The motor branch was intact upon inspection.  No masses or abnormalities were identified in ulnar bursa.  The wounds were irrigated copiously, and the skin closed with nylon. The wound was injected with 1/2% marcaine followed by a sterile dressing and  volar splint .  Tourniquet was deflated with good return of blood flow to all fingers. Sponge and needle counts were correct.  The patient tolerated this well, with no complications. The patient was awakened and taken to recovery in good condition.

## 2023-01-21 NOTE — H&P (Signed)
THE PATIENT WAS SEEN PRIOR TO SURGERY TODAY.  HISTORY, ALLERGIES, HOME MEDICATIONS AND OPERATIVE PROCEDURE WERE REVIEWED. RISKS AND BENEFITS OF SURGERY DISCUSSED WITH PATIENT AGAIN.  NO CHANGES FROM INITIAL HISTORY AND PHYSICAL NOTED.    

## 2023-01-22 ENCOUNTER — Encounter: Payer: Self-pay | Admitting: Specialist

## 2023-01-25 DIAGNOSIS — G5601 Carpal tunnel syndrome, right upper limb: Secondary | ICD-10-CM | POA: Diagnosis not present

## 2023-02-10 ENCOUNTER — Ambulatory Visit (INDEPENDENT_AMBULATORY_CARE_PROVIDER_SITE_OTHER): Payer: Medicare HMO

## 2023-02-10 VITALS — Ht 68.0 in | Wt 230.0 lb

## 2023-02-10 DIAGNOSIS — Z0000 Encounter for general adult medical examination without abnormal findings: Secondary | ICD-10-CM | POA: Diagnosis not present

## 2023-02-10 NOTE — Patient Instructions (Addendum)
Stephen Ponce , Thank you for taking time to come for your Medicare Wellness Visit. I appreciate your ongoing commitment to your health goals. Please review the following plan we discussed and let me know if I can assist you in the future.   These are the goals we discussed:  Goals      LIFESTYLE - DECREASE FALLS RISK     Recommend to remove any items from the home that may cause slips or trips.     Patient Stated     Lose 30 pounds        This is a list of the screening recommended for you and due dates:  Health Maintenance  Topic Date Due   Zoster (Shingles) Vaccine (1 of 2) Never done   COVID-19 Vaccine (5 - 2023-24 season) 09/12/2022   Flu Shot  04/08/2023   Medicare Annual Wellness Visit  02/10/2024   DTaP/Tdap/Td vaccine (3 - Td or Tdap) 12/06/2024   Colon Cancer Screening  06/11/2027   Pneumonia Vaccine  Completed   Hepatitis C Screening  Completed   HPV Vaccine  Aged Out    Advanced directives: none  Conditions/risks identified: Aim for 30 minutes of exercise or brisk walking, 6-8 glasses of water, and 5 servings of fruits and vegetables each day.   Next appointment: Follow up in one year for your annual wellness visit. 02/14/24 @ 8:15 telephone  Preventive Care 65 Years and Older, Male  Preventive care refers to lifestyle choices and visits with your health care provider that can promote health and wellness. What does preventive care include? A yearly physical exam. This is also called an annual well check. Dental exams once or twice a year. Routine eye exams. Ask your health care provider how often you should have your eyes checked. Personal lifestyle choices, including: Daily care of your teeth and gums. Regular physical activity. Eating a healthy diet. Avoiding tobacco and drug use. Limiting alcohol use. Practicing safe sex. Taking low doses of aspirin every day. Taking vitamin and mineral supplements as recommended by your health care provider. What  happens during an annual well check? The services and screenings done by your health care provider during your annual well check will depend on your age, overall health, lifestyle risk factors, and family history of disease. Counseling  Your health care provider may ask you questions about your: Alcohol use. Tobacco use. Drug use. Emotional well-being. Home and relationship well-being. Sexual activity. Eating habits. History of falls. Memory and ability to understand (cognition). Work and work Astronomer. Screening  You may have the following tests or measurements: Height, weight, and BMI. Blood pressure. Lipid and cholesterol levels. These may be checked every 5 years, or more frequently if you are over 67 years old. Skin check. Lung cancer screening. You may have this screening every year starting at age 6 if you have a 30-pack-year history of smoking and currently smoke or have quit within the past 15 years. Fecal occult blood test (FOBT) of the stool. You may have this test every year starting at age 25. Flexible sigmoidoscopy or colonoscopy. You may have a sigmoidoscopy every 5 years or a colonoscopy every 10 years starting at age 60. Prostate cancer screening. Recommendations will vary depending on your family history and other risks. Hepatitis C blood test. Hepatitis B blood test. Sexually transmitted disease (STD) testing. Diabetes screening. This is done by checking your blood sugar (glucose) after you have not eaten for a while (fasting). You may have this done  every 1-3 years. Abdominal aortic aneurysm (AAA) screening. You may need this if you are a current or former smoker. Osteoporosis. You may be screened starting at age 55 if you are at high risk. Talk with your health care provider about your test results, treatment options, and if necessary, the need for more tests. Vaccines  Your health care provider may recommend certain vaccines, such as: Influenza vaccine. This  is recommended every year. Tetanus, diphtheria, and acellular pertussis (Tdap, Td) vaccine. You may need a Td booster every 10 years. Zoster vaccine. You may need this after age 78. Pneumococcal 13-valent conjugate (PCV13) vaccine. One dose is recommended after age 53. Pneumococcal polysaccharide (PPSV23) vaccine. One dose is recommended after age 33. Talk to your health care provider about which screenings and vaccines you need and how often you need them. This information is not intended to replace advice given to you by your health care provider. Make sure you discuss any questions you have with your health care provider. Document Released: 09/20/2015 Document Revised: 05/13/2016 Document Reviewed: 06/25/2015 Elsevier Interactive Patient Education  2017 [employer].  Fall Prevention in the Home Falls can cause injuries. They can happen to people of all ages. There are many things you can do to make your home safe and to help prevent falls. What can I do on the outside of my home? Regularly fix the edges of walkways and driveways and fix any cracks. Remove anything that might make you trip as you walk through a door, such as a raised step or threshold. Trim any bushes or trees on the path to your home. Use bright outdoor lighting. Clear any walking paths of anything that might make someone trip, such as rocks or tools. Regularly check to see if handrails are loose or broken. Make sure that both sides of any steps have handrails. Any raised decks and porches should have guardrails on the edges. Have any leaves, snow, or ice cleared regularly. Use sand or salt on walking paths during winter. Clean up any spills in your garage right away. This includes oil or grease spills. What can I do in the bathroom? Use night lights. Install grab bars by the toilet and in the tub and shower. Do not use towel bars as grab bars. Use non-skid mats or decals in the tub or shower. If you need to sit down  in the shower, use a plastic, non-slip stool. Keep the floor dry. Clean up any water that spills on the floor as soon as it happens. Remove soap buildup in the tub or shower regularly. Attach bath mats securely with double-sided non-slip rug tape. Do not have throw rugs and other things on the floor that can make you trip. What can I do in the bedroom? Use night lights. Make sure that you have a light by your bed that is easy to reach. Do not use any sheets or blankets that are too big for your bed. They should not hang down onto the floor. Have a firm chair that has side arms. You can use this for support while you get dressed. Do not have throw rugs and other things on the floor that can make you trip. What can I do in the kitchen? Clean up any spills right away. Avoid walking on wet floors. Keep items that you use a lot in easy-to-reach places. If you need to reach something above you, use a strong step stool that has a grab bar. Keep electrical cords out of the  way. Do not use floor polish or wax that makes floors slippery. If you must use wax, use non-skid floor wax. Do not have throw rugs and other things on the floor that can make you trip. What can I do with my stairs? Do not leave any items on the stairs. Make sure that there are handrails on both sides of the stairs and use them. Fix handrails that are broken or loose. Make sure that handrails are as long as the stairways. Check any carpeting to make sure that it is firmly attached to the stairs. Fix any carpet that is loose or worn. Avoid having throw rugs at the top or bottom of the stairs. If you do have throw rugs, attach them to the floor with carpet tape. Make sure that you have a light switch at the top of the stairs and the bottom of the stairs. If you do not have them, ask someone to add them for you. What else can I do to help prevent falls? Wear shoes that: Do not have high heels. Have rubber bottoms. Are comfortable  and fit you well. Are closed at the toe. Do not wear sandals. If you use a stepladder: Make sure that it is fully opened. Do not climb a closed stepladder. Make sure that both sides of the stepladder are locked into place. Ask someone to hold it for you, if possible. Clearly mark and make sure that you can see: Any grab bars or handrails. First and last steps. Where the edge of each step is. Use tools that help you move around (mobility aids) if they are needed. These include: Canes. Walkers. Scooters. Crutches. Turn on the lights when you go into a dark area. Replace any light bulbs as soon as they burn out. Set up your furniture so you have a clear path. Avoid moving your furniture around. If any of your floors are uneven, fix them. If there are any pets around you, be aware of where they are. Review your medicines with your doctor. Some medicines can make you feel dizzy. This can increase your chance of falling. Ask your doctor what other things that you can do to help prevent falls. This information is not intended to replace advice given to you by your health care provider. Make sure you discuss any questions you have with your health care provider. Document Released: 06/20/2009 Document Revised: 01/30/2016 Document Reviewed: 09/28/2014 Elsevier Interactive Patient Education  2017 [employer].

## 2023-02-10 NOTE — Progress Notes (Signed)
I connected with  Stephen Ponce on 02/10/23 by a audio enabled telemedicine application and verified that I am speaking with the correct person using two identifiers.  Patient Location: Home  Provider Location: Home Office  I discussed the limitations of evaluation and management by telemedicine. The patient expressed understanding and agreed to proceed.  Subjective:   Stephen Ponce is a 78 y.o. male who presents for Medicare Annual/Subsequent preventive examination.  Review of Systems      Cardiac Risk Factors include: advanced age (>56men, >38 women);hypertension;male gender;sedentary lifestyle     Objective:    Today's Vitals   02/10/23 0821  Weight: 230 lb (104.3 kg)  Height: 5\' 8"  (1.727 m)  PainSc: 3    Body mass index is 34.97 kg/m.     02/10/2023    8:34 AM 01/21/2023    6:45 AM 01/13/2023    2:21 PM 09/17/2021    6:27 AM 09/09/2021   10:41 AM 07/11/2021    2:34 PM 06/10/2020    9:12 AM  Advanced Directives  Does Patient Have a Medical Advance Directive? No No No Yes Yes Yes Yes  Type of Advance Directive    Living will Living will Living will Living will  Does patient want to make changes to medical advance directive?    No - Patient declined  No - Patient declined No - Patient declined  Would patient like information on creating a medical advance directive? No - Patient declined No - Patient declined    No - Patient declined     Current Medications (verified) Outpatient Encounter Medications as of 02/10/2023  Medication Sig   ascorbic acid (VITAMIN C) 1000 MG tablet Take 1,000 mg by mouth in the morning.   Aspirin-Acetaminophen-Caffeine (EXCEDRIN PO) Take 2 tablets by mouth 2 (two) times daily as needed.   Cholecalciferol (VITAMIN D3) 50 MCG (2000 UT) TABS Take 2,000 Units by mouth in the morning.   Glucosamine-Chondroitin (COSAMIN DS PO) Take 1 tablet by mouth in the morning.   MegaRed Omega-3 Krill Oil 500 MG CAPS Take 500 mg by mouth in the morning.    metoprolol succinate (TOPROL-XL) 25 MG 24 hr tablet TAKE 1/2 TABLET BY MOUTH EVERY DAY   Multiple Vitamin (MULTIVITAMIN WITH MINERALS) TABS tablet Take 1 tablet by mouth daily. One A Day for Men 50+   pyridoxine (B-6) 200 MG tablet Take 200 mg by mouth in the morning.   TURMERIC PO Take 300 mg by mouth daily.   Ubiquinol 100 MG CAPS Take 100 mg by mouth in the morning.   cyanocobalamin 1000 MCG tablet Take 1,000 mcg by mouth daily.   HYDROcodone-acetaminophen (NORCO) 5-325 MG tablet Take 1-2 tablets by mouth every 6 (six) hours as needed. (Patient not taking: Reported on 02/10/2023)   meloxicam (MOBIC) 15 MG tablet Take 1 tablet (15 mg total) by mouth daily. (Patient not taking: Reported on 02/10/2023)   No facility-administered encounter medications on file as of 02/10/2023.    Allergies (verified) Penicillins and Gabapentin   History: Past Medical History:  Diagnosis Date   Acquired trigger finger of right middle finger    Actinic keratosis    Arthritis    Basal cell carcinoma 10/14/2016   Left top of shoulder. 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 SCCis   Past Surgical History:  Procedure Laterality Date   CARPAL TUNNEL RELEASE Left 04/10/2019   Procedure: CARPAL TUNNEL RELEASE;  Surgeon: Deeann Saint, MD;  Location: [facility];  Service: Orthopedics;  Laterality: Left;   CARPAL TUNNEL RELEASE Right 01/21/2023   Procedure: CARPAL TUNNEL RELEASE;  Surgeon: Deeann Saint, MD;  Location: [facility];  Service: Orthopedics;  Laterality: Right;   CATARACT  EXTRACTION W/PHACO Right 02/13/2020   Procedure: CATARACT EXTRACTION PHACO AND INTRAOCULAR LENS PLACEMENT (IOC) RIGHT 4.48  00:31.2;  Surgeon: Galen Manila, MD;  Location: [facility];  Service: Ophthalmology;  Laterality: Right;   CATARACT EXTRACTION W/PHACO Left 04/01/2020   Procedure: CATARACT EXTRACTION PHACO AND INTRAOCULAR LENS PLACEMENT (IOC) LEFT 6.87 00:38.9 ;  Surgeon: Galen Manila, MD;  Location: [facility];  Service: Ophthalmology;  Laterality: Left;   COLONOSCOPY  08/09/2014   tubular adenoma   COLONOSCOPY WITH PROPOFOL N/A 06/10/2020   Procedure: COLONOSCOPY WITH BIOPSY;  Surgeon: Midge Minium, MD;  Location: [facility];  Service: Endoscopy;  Laterality: N/A;  priority 4   EXTRACORPOREAL SHOCK WAVE LITHOTRIPSY     x4   HARDWARE REMOVAL Left 09/17/2021   Procedure: HARDWARE REMOVAL KNEE;  Surgeon: Lyndle Herrlich, MD;  Location: [facility];  Service: Orthopedics;  Laterality: Left;   OSTEOTOMY Left 2002   KNEE   POLYPECTOMY N/A 06/10/2020   Procedure: POLYPECTOMY;  Surgeon: Midge Minium, MD;  Location: [facility];  Service: Endoscopy;  Laterality: N/A;   SHOULDER ARTHROSCOPY WITH OPEN ROTATOR CUFF REPAIR Left 07/18/2018   Procedure: SHOULDER ARTHROSCOPY WITH OPEN ROTATOR CUFF REPAIR;  Surgeon: Deeann Saint, MD;  Location: [facility];  Service: Orthopedics;  Laterality: Left;   TOTAL KNEE ARTHROPLASTY Left 02/03/2022   TRIGGER FINGER RELEASE Right 01/21/2023   Procedure: RELEASE TRIGGER FINGER/A-1 PULLEY;  Surgeon: Deeann Saint, MD;  Location: [facility];  Service: Orthopedics;  Laterality: Right;   Family History  Problem Relation Age of Onset   CAD Father    Stomach cancer Brother    Lung cancer Brother    Bone cancer Brother    Social History   Socioeconomic History   Marital status: Married    Spouse name: Luther Parody   Number of children: 2   Years of education: Not on file   Highest education level: High school graduate   Occupational History   Occupation: delivery driver  Tobacco Use   Smoking status: Former    Packs/day: 3.00    Years: 35.00    Additional pack years: 0.00    Total pack years: 105.00    Types: Cigarettes    Quit date: 09/07/1996    Years since quitting: 26.4   Smokeless tobacco: Never  Vaping Use   Vaping Use: Never used  Substance and Sexual Activity   Alcohol use: Yes    Alcohol/week: 0.0 - 2.0 standard drinks of alcohol    Comment: 1-2 beers a couple times a month   Drug use: No   Sexual activity: Yes  Other Topics Concern   Not on file  Social History Narrative   Retired, 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  Social Determinants of Health   Financial Resource Strain: Low Risk  (02/10/2023)   Overall Financial Resource Strain (CARDIA)    Difficulty of Paying Living Expenses: Not hard at all  Food Insecurity: No Food Insecurity (02/10/2023)   Hunger Vital Sign    Worried About Running Out of Food in the Last Year: Never true    Ran Out of Food in the Last Year: Never true  Transportation Needs: No Transportation Needs (02/10/2023)   PRAPARE - Administrator, Civil Service (Medical): No    Lack of Transportation (Non-Medical): No  Physical Activity: Inactive (02/10/2023)   Exercise Vital Sign    Days of Exercise per Week: 0 days    Minutes of Exercise per Session: 0 min  Stress: No Stress Concern Present (02/10/2023)   [employer] of Occupational Health - Occupational Stress Questionnaire    Feeling of Stress : Not at all  Social Connections: Socially Integrated (02/10/2023)   Social Connection and Isolation Panel [NHANES]    Frequency of Communication with Friends and Family: More than three times a week    Frequency of Social Gatherings with Friends and Family: More than three times a week    Attends Religious Services: More than 4 times per year    Active Member of [employer] or Organizations: Yes    Attends Engineer, structural: More  than 4 times per year    Marital Status: Married    Tobacco Counseling Counseling given: Not Answered   Clinical Intake:  Pre-visit preparation completed: Yes  Pain : 0-10 (post op pain) Pain Score: 3  Pain Type: Acute pain Pain Location: Hand Pain Orientation: Right Pain Descriptors / Indicators: Sore, Tingling Pain Onset: 1 to 4 weeks ago     Nutritional Risks: None Diabetes: No  How often do you need to have someone help you when you read instructions, pamphlets, or other written materials from your doctor or pharmacy?: 1 - Never  Diabetic?no  Interpreter Needed?: No  Information entered by :: C.Temia Debroux LPN   Activities of Daily Living    02/10/2023    8:34 AM 02/09/2023    4:36 PM  In your present state of health, do you have any difficulty performing the following activities:  Hearing? 0 0  Vision? 0 0  Difficulty concentrating or making decisions? 0 0  Walking or climbing stairs? 0 0  Dressing or bathing? 0 0  Doing errands, shopping? 0 0  Preparing Food and eating ? N N  Using the Toilet? N N  In the past six months, have you accidently leaked urine? N N  Do you have problems with loss of bowel control? N N  Managing your Medications? N N  Managing your Finances? N N  Housekeeping or managing your Housekeeping? N N    Patient Care Team: Eden Emms, NP as PCP - General (Nurse Practitioner) Galen Manila, MD as Referring Physician (Ophthalmology) Audria Nine as Nurse Practitioner  Indicate any recent Medical Services you may have received from other than [facility] providers in the past year (date may be approximate).     Assessment:   This is a routine wellness examination for Stephen Ponce.  Hearing/Vision screen Hearing Screening - Comments:: Hearing loss left ear Vision Screening - Comments:: Readers - [facility]  Dietary issues and exercise activities discussed: Current Exercise Habits: The patient does not participate in regular exercise at  present, Exercise limited by: None identified   Goals Addressed  This Visit's Progress    Patient Stated       Lose 30 pounds       Depression Screen    02/10/2023    8:33 AM 08/25/2022    9:12 AM 07/02/2021    3:21 PM 05/08/2020    9:58 AM 10/26/2018    2:45 PM 08/24/2017    9:24 AM 08/24/2016    9:16 AM  PHQ 2/9 Scores  PHQ - 2 Score 0 0 0 0 0 0 0  PHQ- 9 Score   1        Fall Risk    02/10/2023    8:27 AM 02/09/2023    4:36 PM 02/06/2023   10:05 AM 08/25/2022    9:11 AM 07/02/2021    3:21 PM  Fall Risk   Falls in the past year? 1 1 1  0 0  Comment slipped on wet deck      Number falls in past yr: 0 0 0 0 0  Injury with Fall? 1   0 0  Comment bump on head      Risk for fall due to : No Fall Risks   No Fall Risks   Follow up Falls evaluation completed;Falls prevention discussed        FALL RISK PREVENTION PERTAINING TO THE HOME:  Any stairs in or around the home? Yes  If so, are there any without handrails? Yes  Home free of loose throw rugs in walkways, pet beds, electrical cords, etc? Yes  Adequate lighting in your home to reduce risk of falls? Yes   ASSISTIVE DEVICES UTILIZED TO PREVENT FALLS:  Life alert? No  Use of a cane, walker or w/c? No  Grab bars in the bathroom? No  Shower chair or bench in shower? Yes  Elevated toilet seat or a handicapped toilet? No    Cognitive Function:        02/10/2023    8:35 AM  6CIT Screen  What Year? 0 points  What month? 0 points  What time? 0 points  Count back from 20 0 points  Months in reverse 0 points  Repeat phrase 2 points  Total Score 2 points    Immunizations Immunization History  Administered Date(s) Administered   Fluad Quad(high Dose 65+) 07/18/2022   Influenza, High Dose Seasonal PF 06/28/2018   PFIZER(Purple Top)SARS-COV-2 Vaccination 10/30/2019, 11/20/2019   Pfizer Covid-19 Vaccine Bivalent Booster 24yrs & up 07/18/2022   Pneumococcal Conjugate-13 06/28/2014   Pneumococcal  Polysaccharide-23 08/24/2016   Td 05/11/2000   Tdap 12/07/2014   Zoster, Live 12/07/2014    TDAP status: Up to date  Flu Vaccine status: Up to date  Pneumococcal vaccine status: Up to date  Covid-19 vaccine status: Declined, Education has been provided regarding the importance of this vaccine but patient still declined. Advised may receive this vaccine at local pharmacy or Health Dept.or vaccine clinic. Aware to provide a copy of the vaccination record if obtained from local pharmacy or Health Dept. Verbalized acceptance and understanding.  Qualifies for Shingles Vaccine? Yes   Zostavax completed No   Shingrix Completed?: No.    Education has been provided regarding the importance of this vaccine. Patient has been advised to call insurance company to determine out of pocket expense if they have not yet received this vaccine. Advised may also receive vaccine at local pharmacy or Health Dept. Verbalized acceptance and understanding.  Screening Tests Health Maintenance  Topic Date Due   Zoster Vaccines- Shingrix (1 of 2)  Never done   COVID-19 Vaccine (5 - 2023-24 season) 09/12/2022   INFLUENZA VACCINE  04/08/2023   Medicare Annual Wellness (AWV)  02/10/2024   DTaP/Tdap/Td (3 - Td or Tdap) 12/06/2024   Colonoscopy  06/11/2027   Pneumonia Vaccine 53+ Years old  Completed   Hepatitis C Screening  Completed   HPV VACCINES  Aged Out    Health Maintenance  Health Maintenance Due  Topic Date Due   Zoster Vaccines- Shingrix (1 of 2) Never done   COVID-19 Vaccine (5 - 2023-24 season) 09/12/2022    Colorectal cancer screening: Type of screening: Colonoscopy. Completed 06/10/20. Repeat every 7 years  Lung Cancer Screening: (Low Dose CT Chest recommended if Age 50-80 years, 30 pack-year currently smoking OR have quit w/in 15years.) does not qualify.   Lung Cancer Screening Referral: no  Additional Screening:  Hepatitis C Screening: does qualify; Completed 08/24/2017  Vision  Screening: Recommended annual ophthalmology exams for early detection of glaucoma and other disorders of the eye. Is the patient up to date with their annual eye exam?  Yes  Who is the provider or what is the name of the office in which the patient attends annual eye exams? [facility] If pt is not established with a provider, would they like to be referred to a provider to establish care? Yes .   Dental Screening: Recommended annual dental exams for proper oral hygiene  Community Resource Referral / Chronic Care Management: CRR required this visit?  No   CCM required this visit?  No      Plan:     I have personally reviewed and noted the following in the patient's chart:   Medical and social history Use of alcohol, tobacco or illicit drugs  Current medications and supplements including opioid prescriptions. Patient is not currently taking opioid prescriptions. Functional ability and status Nutritional status Physical activity Advanced directives List of other physicians Hospitalizations, surgeries, and ER visits in previous 12 months Vitals Screenings to include cognitive, depression, and falls Referrals and appointments  In addition, I have reviewed and discussed with patient certain preventive protocols, quality metrics, and best practice recommendations. A written personalized care plan for preventive services as well as general preventive health recommendations were provided to patient.     Maryan Puls, LPN   09/15/1476   Nurse Notes: none

## 2023-02-24 ENCOUNTER — Ambulatory Visit (INDEPENDENT_AMBULATORY_CARE_PROVIDER_SITE_OTHER): Payer: Medicare HMO | Admitting: Nurse Practitioner

## 2023-02-24 ENCOUNTER — Encounter: Payer: Self-pay | Admitting: Nurse Practitioner

## 2023-02-24 VITALS — BP 138/80 | HR 70 | Temp 98.0°F | Resp 16 | Ht 68.0 in | Wt 231.0 lb

## 2023-02-24 DIAGNOSIS — I10 Essential (primary) hypertension: Secondary | ICD-10-CM | POA: Diagnosis not present

## 2023-02-24 DIAGNOSIS — E669 Obesity, unspecified: Secondary | ICD-10-CM | POA: Diagnosis not present

## 2023-02-24 DIAGNOSIS — Z6830 Body mass index (BMI) 30.0-30.9, adult: Secondary | ICD-10-CM

## 2023-02-24 DIAGNOSIS — R600 Localized edema: Secondary | ICD-10-CM

## 2023-02-24 NOTE — Assessment & Plan Note (Signed)
Patient currently maintained on metoprolol 12.5 mg daily.  Blood pressure under good control.  Patient does not check blood pressure at home but has the ability to if needed continue medication as prescribed

## 2023-02-24 NOTE — Patient Instructions (Signed)
Nice to see you today I want to see you in 6 months for your physical with labs, sooner if you need me  If you decide the swelling gets too bad reach out and we can try a fluid pill but will need to check the kidneys after you have been on it

## 2023-02-24 NOTE — Progress Notes (Signed)
Established Patient Office Visit  Subjective   Patient ID: Stephen Ponce, male    DOB: 07/09/1946  Age: 77 y.o. MRN: [id number]  Chief Complaint  Patient presents with   Hypertension   Edema      HTN: patient is currently maintained on metoprolol. He does have a blood pressure cuff at home that he rarely uses  Edema: was an issue last office visit. With the Left greater than the right. We did check labs that came back negative and he wears compression garments that were helpful at that time.   States that his left leg is still swelling. States that the right one has been swelling some. States that it gets worse with the day as it goes.     Review of Systems  Constitutional:  Negative for chills and fever.  Respiratory:  Negative for shortness of breath.   Cardiovascular:  Positive for leg swelling. Negative for chest pain.      Objective:     BP 138/80   Pulse 70   Temp 98 F (36.7 C)   Resp 16   Ht 5\' 8"  (1.727 m)   Wt 231 lb (104.8 kg)   SpO2 95%   BMI 35.12 kg/m  BP Readings from Last 3 Encounters:  02/24/23 138/80  01/21/23 (!) 157/61  08/25/22 136/76   Wt Readings from Last 3 Encounters:  02/24/23 231 lb (104.8 kg)  02/10/23 230 lb (104.3 kg)  01/21/23 231 lb (104.8 kg)      Physical Exam Vitals and nursing note reviewed.  Constitutional:      Appearance: Normal appearance.  Cardiovascular:     Rate and Rhythm: Normal rate and regular rhythm.     Heart sounds: Normal heart sounds.  Pulmonary:     Effort: Pulmonary effort is normal.     Breath sounds: Normal breath sounds.  Musculoskeletal:     Right lower leg: 2+ Pitting Edema present.     Left lower leg: 2+ Pitting Edema (L>R) present.  Neurological:     Mental Status: He is alert.      No results found for any visits on 02/24/23.    The 10-year ASCVD risk score (Arnett DK, et al., 2019) is: 35.1%    Assessment & Plan:   Problem List Items Addressed This Visit        Cardiovascular and Mediastinum   Essential hypertension - Primary    Patient currently maintained on metoprolol 12.5 mg daily.  Blood pressure under good control.  Patient does not check blood pressure at home but has the ability to if needed continue medication as prescribed        Other   Obesity (BMI 30-39.9)   Lower extremity edema    Left greater than right.  Patient is having more swelling in the right lower extremity than last office visit.  Skin integrity is intact did offer to place patient on diuretic for a few days to see if he can get it to abate patient politely declined states that he has had a acute kidney failure in the past.  Last kidney function was within normal limits approximately 1 month ago.  Patient not having any symptoms such as shortness of breath or difficulty lying flat.  Skin integrity is intact 20 minutes have to do with the office know we will place on diuretic       Return in about 6 months (around 08/27/2023) for CPE and Labs.    Audria Nine,  NP

## 2023-02-24 NOTE — Assessment & Plan Note (Signed)
Left greater than right.  Patient is having more swelling in the right lower extremity than last office visit.  Skin integrity is intact did offer to place patient on diuretic for a few days to see if he can get it to abate patient politely declined states that he has had a acute kidney failure in the past.  Last kidney function was within normal limits approximately 1 month ago.  Patient not having any symptoms such as shortness of breath or difficulty lying flat.  Skin integrity is intact 20 minutes have to do with the office know we will place on diuretic

## 2023-03-19 ENCOUNTER — Other Ambulatory Visit: Payer: Self-pay | Admitting: Physician Assistant

## 2023-04-05 ENCOUNTER — Other Ambulatory Visit: Payer: Self-pay | Admitting: Physician Assistant

## 2023-04-06 ENCOUNTER — Telehealth: Payer: Self-pay | Admitting: Nurse Practitioner

## 2023-04-06 MED ORDER — METOPROLOL SUCCINATE ER 25 MG PO TB24: 12.5000 mg | ORAL_TABLET | Freq: Every day | ORAL | 2 refills | Status: DC

## 2023-04-06 NOTE — Telephone Encounter (Signed)
Pt came in office stated he going out of town and need refill on Rx  Encourage patient to contact the pharmacy for refills or they can request refills through [facility]  LAST APPOINTMENT DATE:  Please schedule appointment if longer than 1 year  NEXT APPOINTMENT DATE:  MEDICATION:  metoprolol succinate (TOPROL-XL) 25 MG 24 hr tablet  Is the patient out of medication?   PHARMACY: [id number] - [place], [place] - [address]    Let patient know to contact pharmacy at the end of the day to make sure medication is ready.  Please notify patient to allow 48-72 hours to process  CLINICAL FILLS OUT ALL BELOW:   LAST REFILL:  QTY:  REFILL DATE:    OTHER COMMENTS:    Okay for refill?  Please advise

## 2023-06-25 DIAGNOSIS — E669 Obesity, unspecified: Secondary | ICD-10-CM | POA: Diagnosis not present

## 2023-06-25 DIAGNOSIS — N181 Chronic kidney disease, stage 1: Secondary | ICD-10-CM | POA: Diagnosis not present

## 2023-06-25 DIAGNOSIS — Z87891 Personal history of nicotine dependence: Secondary | ICD-10-CM | POA: Diagnosis not present

## 2023-06-25 DIAGNOSIS — I129 Hypertensive chronic kidney disease with stage 1 through stage 4 chronic kidney disease, or unspecified chronic kidney disease: Secondary | ICD-10-CM | POA: Diagnosis not present

## 2023-06-25 DIAGNOSIS — Z880 Allergy status to penicillin: Secondary | ICD-10-CM | POA: Diagnosis not present

## 2023-06-25 DIAGNOSIS — Z86718 Personal history of other venous thrombosis and embolism: Secondary | ICD-10-CM | POA: Diagnosis not present

## 2023-06-25 DIAGNOSIS — Z6832 Body mass index (BMI) 32.0-32.9, adult: Secondary | ICD-10-CM | POA: Diagnosis not present

## 2023-06-25 DIAGNOSIS — M792 Neuralgia and neuritis, unspecified: Secondary | ICD-10-CM | POA: Diagnosis not present

## 2023-06-25 DIAGNOSIS — M1990 Unspecified osteoarthritis, unspecified site: Secondary | ICD-10-CM | POA: Diagnosis not present

## 2023-06-25 DIAGNOSIS — K5900 Constipation, unspecified: Secondary | ICD-10-CM | POA: Diagnosis not present

## 2023-06-25 DIAGNOSIS — Z85828 Personal history of other malignant neoplasm of skin: Secondary | ICD-10-CM | POA: Diagnosis not present

## 2023-06-25 DIAGNOSIS — Z008 Encounter for other general examination: Secondary | ICD-10-CM | POA: Diagnosis not present

## 2023-06-25 DIAGNOSIS — Z809 Family history of malignant neoplasm, unspecified: Secondary | ICD-10-CM | POA: Diagnosis not present

## 2023-07-07 ENCOUNTER — Encounter: Payer: Self-pay | Admitting: Dermatology

## 2023-07-07 ENCOUNTER — Ambulatory Visit: Payer: Medicare HMO | Admitting: Dermatology

## 2023-07-07 DIAGNOSIS — Z872 Personal history of diseases of the skin and subcutaneous tissue: Secondary | ICD-10-CM

## 2023-07-07 DIAGNOSIS — L570 Actinic keratosis: Secondary | ICD-10-CM | POA: Diagnosis not present

## 2023-07-07 DIAGNOSIS — D229 Melanocytic nevi, unspecified: Secondary | ICD-10-CM

## 2023-07-07 DIAGNOSIS — L820 Inflamed seborrheic keratosis: Secondary | ICD-10-CM

## 2023-07-07 DIAGNOSIS — Z8589 Personal history of malignant neoplasm of other organs and systems: Secondary | ICD-10-CM

## 2023-07-07 DIAGNOSIS — L578 Other skin changes due to chronic exposure to nonionizing radiation: Secondary | ICD-10-CM

## 2023-07-07 DIAGNOSIS — Z85828 Personal history of other malignant neoplasm of skin: Secondary | ICD-10-CM | POA: Diagnosis not present

## 2023-07-07 DIAGNOSIS — L821 Other seborrheic keratosis: Secondary | ICD-10-CM

## 2023-07-07 DIAGNOSIS — W908XXA Exposure to other nonionizing radiation, initial encounter: Secondary | ICD-10-CM

## 2023-07-07 DIAGNOSIS — D1801 Hemangioma of skin and subcutaneous tissue: Secondary | ICD-10-CM

## 2023-07-07 DIAGNOSIS — Z1283 Encounter for screening for malignant neoplasm of skin: Secondary | ICD-10-CM

## 2023-07-07 DIAGNOSIS — L814 Other melanin hyperpigmentation: Secondary | ICD-10-CM | POA: Diagnosis not present

## 2023-07-07 NOTE — Progress Notes (Signed)
Follow-Up Visit   Subjective  Stephen Ponce is a 77 y.o. male who presents for the following: Skin Cancer Screening and Full Body Skin Exam. HxSCC, HxBCC, Hx of AKs. Areas of concern on face.   The patient presents for Total-Body Skin Exam (TBSE) for skin cancer screening and mole check. The patient has spots, moles and lesions to be evaluated, some may be new or changing and the patient may have concern these could be cancer.    The following portions of the chart were reviewed this encounter and updated as appropriate: medications, allergies, medical history  Review of Systems:  No other skin or systemic complaints except as noted in HPI or Assessment and Plan.  Objective  Well appearing patient in no apparent distress; mood and affect are within normal limits.  A full examination was performed including scalp, head, eyes, ears, nose, lips, neck, chest, axillae, abdomen, back, buttocks, bilateral upper extremities, bilateral lower extremities, hands, feet, fingers, toes, fingernails, and toenails. All findings within normal limits unless otherwise noted below.   Relevant physical exam findings are noted in the Assessment and Plan.  face x15 (15) Erythematous keratotic or waxy stuck-on papule or plaque.  face x2 (2) Erythematous thin papules/macules with gritty scale.     Assessment & Plan   HISTORY OF BASAL CELL CARCINOMA OF THE SKIN - No evidence of recurrence today - Recommend regular full body skin exams - Recommend daily broad spectrum sunscreen SPF 30+ to sun-exposed areas, reapply every 2 hours as needed.  - Call if any new or changing lesions are noted between office visits   HISTORY OF SQUAMOUS CELL CARCINOMA OF THE SKIN - No evidence of recurrence today - No lymphadenopathy - Recommend regular full body skin exams - Recommend daily broad spectrum sunscreen SPF 30+ to sun-exposed areas, reapply every 2 hours as needed.  - Call if any new or changing lesions  are noted between office visits     SKIN CANCER SCREENING PERFORMED TODAY.  ACTINIC DAMAGE - Chronic condition, secondary to cumulative UV/sun exposure - diffuse scaly erythematous macules with underlying dyspigmentation - Recommend daily broad spectrum sunscreen SPF 30+ to sun-exposed areas, reapply every 2 hours as needed.  - Staying in the shade or wearing long sleeves, sun glasses (UVA+UVB protection) and wide brim hats (4-inch brim around the entire circumference of the hat) are also recommended for sun protection.  - Call for new or changing lesions.  LENTIGINES, SEBORRHEIC KERATOSES, HEMANGIOMAS - Benign normal skin lesions - Benign-appearing - Call for any changes  MELANOCYTIC NEVI - Tan-brown and/or pink-flesh-colored symmetric macules and papules - Benign appearing on exam today - Observation - Call clinic for new or changing moles - Recommend daily use of broad spectrum spf 30+ sunscreen to sun-exposed areas.       Inflamed seborrheic keratosis (15) face x15  Symptomatic, irritating, patient would like treated.  Destruction of lesion - face x15 (15) Complexity: simple   Destruction method: cryotherapy   Informed consent: discussed and consent obtained   Timeout:  patient name, date of birth, surgical site, and procedure verified Lesion destroyed using liquid nitrogen: Yes   Region frozen until ice ball extended beyond lesion: Yes   Outcome: patient tolerated procedure well with no complications   Post-procedure details: wound care instructions given   Additional details:  Prior to procedure, discussed risks of blister formation, small wound, skin dyspigmentation, or rare scar following cryotherapy. Recommend Vaseline ointment to treated areas while healing.  AK (actinic keratosis) (2) face x2  Actinic keratoses are precancerous spots that appear secondary to cumulative UV radiation exposure/sun exposure over time. They are chronic with expected duration over  1 year. A portion of actinic keratoses will progress to squamous cell carcinoma of the skin. It is not possible to reliably predict which spots will progress to skin cancer and so treatment is recommended to prevent development of skin cancer.  Recommend daily broad spectrum sunscreen SPF 30+ to sun-exposed areas, reapply every 2 hours as needed.  Recommend staying in the shade or wearing long sleeves, sun glasses (UVA+UVB protection) and wide brim hats (4-inch brim around the entire circumference of the hat). Call for new or changing lesions.  Destruction of lesion - face x2 (2) Complexity: simple   Destruction method: cryotherapy   Informed consent: discussed and consent obtained   Timeout:  patient name, date of birth, surgical site, and procedure verified Lesion destroyed using liquid nitrogen: Yes   Region frozen until ice ball extended beyond lesion: Yes   Outcome: patient tolerated procedure well with no complications   Post-procedure details: wound care instructions given   Additional details:  Prior to procedure, discussed risks of blister formation, small wound, skin dyspigmentation, or rare scar following cryotherapy. Recommend Vaseline ointment to treated areas while healing.    Return in about 1 year (around 07/06/2024) for TBSE, HxBCC, HxSCC.  I, Lawson Radar, CMA, am acting as scribe for Armida Sans, MD.   Documentation: I have reviewed the above documentation for accuracy and completeness, and I agree with the above.  Armida Sans, MD

## 2023-07-07 NOTE — Patient Instructions (Signed)

## 2023-08-30 ENCOUNTER — Ambulatory Visit: Payer: Medicare HMO | Admitting: Nurse Practitioner

## 2023-08-30 ENCOUNTER — Encounter: Payer: Self-pay | Admitting: Nurse Practitioner

## 2023-08-30 VITALS — BP 110/78 | HR 74 | Temp 98.1°F | Ht 68.0 in | Wt 228.2 lb

## 2023-08-30 DIAGNOSIS — R7303 Prediabetes: Secondary | ICD-10-CM

## 2023-08-30 DIAGNOSIS — Z0000 Encounter for general adult medical examination without abnormal findings: Secondary | ICD-10-CM | POA: Diagnosis not present

## 2023-08-30 DIAGNOSIS — E669 Obesity, unspecified: Secondary | ICD-10-CM

## 2023-08-30 DIAGNOSIS — Z23 Encounter for immunization: Secondary | ICD-10-CM

## 2023-08-30 DIAGNOSIS — Z125 Encounter for screening for malignant neoplasm of prostate: Secondary | ICD-10-CM | POA: Diagnosis not present

## 2023-08-30 DIAGNOSIS — Z87891 Personal history of nicotine dependence: Secondary | ICD-10-CM

## 2023-08-30 DIAGNOSIS — I10 Essential (primary) hypertension: Secondary | ICD-10-CM | POA: Diagnosis not present

## 2023-08-30 LAB — COMPREHENSIVE METABOLIC PANEL
ALT: 28 U/L (ref 0–53)
AST: 23 U/L (ref 0–37)
Albumin: 4.2 g/dL (ref 3.5–5.2)
Alkaline Phosphatase: 47 U/L (ref 39–117)
BUN: 19 mg/dL (ref 6–23)
CO2: 24 meq/L (ref 19–32)
Calcium: 9.6 mg/dL (ref 8.4–10.5)
Chloride: 107 meq/L (ref 96–112)
Creatinine, Ser: 0.61 mg/dL (ref 0.40–1.50)
GFR: 92.34 mL/min (ref >60.00)
Glucose, Bld: 101 mg/dL — ABNORMAL HIGH (ref 70–99)
Potassium: 4.2 meq/L (ref 3.5–5.1)
Sodium: 141 meq/L (ref 135–145)
Total Bilirubin: 1.1 mg/dL (ref 0.2–1.2)
Total Protein: 6.9 g/dL (ref 6.0–8.3)

## 2023-08-30 LAB — CBC
HCT: 47.9 % (ref 39.0–52.0)
Hemoglobin: 16.1 g/dL (ref 13.0–17.0)
MCHC: 33.7 g/dL (ref 30.0–36.0)
MCV: 94.6 fL (ref 78.0–100.0)
Platelets: 220 10*3/uL (ref 150.0–400.0)
RBC: 5.06 Mil/uL (ref 4.22–5.81)
RDW: 13.5 % (ref 11.5–15.5)
WBC: 6.4 10*3/uL (ref 4.0–10.5)

## 2023-08-30 LAB — URINALYSIS, MICROSCOPIC ONLY: RBC / HPF: NONE SEEN (ref 0–?)

## 2023-08-30 LAB — LIPID PANEL
Cholesterol: 155 mg/dL (ref 0–200)
HDL: 42.9 mg/dL (ref >39.00)
LDL Cholesterol: 89 mg/dL (ref 0–99)
NonHDL: 111.82
Total CHOL/HDL Ratio: 4
Triglycerides: 116 mg/dL (ref 0.0–149.0)
VLDL: 23.2 mg/dL (ref 0.0–40.0)

## 2023-08-30 LAB — PSA, MEDICARE: PSA: 1.55 ng/mL (ref 0.10–4.00)

## 2023-08-30 LAB — TSH: TSH: 1.46 u[IU]/mL (ref 0.35–5.50)

## 2023-08-30 LAB — HEMOGLOBIN A1C: Hgb A1c MFr Bld: 5.8 % (ref 4.6–6.5)

## 2023-08-30 NOTE — Assessment & Plan Note (Signed)
Pending urine microscopy for microscopic hematuria

## 2023-08-30 NOTE — Progress Notes (Signed)
Established Patient Office Visit  Subjective   Patient ID: Stephen Ponce, male    DOB: [birth year]  Age: 77 y.o. MRN: [id number]  Chief Complaint  Patient presents with   Annual Exam    HPI  HTN: does not check bp at home. States that he tolerates the medication well.   for complete physical and follow up of chronic conditions.  Immunizations: -Tetanus: Completed in 2016 -Influenza: not up to date, will get at local pharmacy with spouse at the same time -Shingles: Zostavax live -Pneumonia: Update Prevnar 20 today  Diet: Fair diet. 3 meals a day and some snacks. Coffee in the am and then water  Exercise: No regular exercise. Around the house   Eye exam: PRN  Dental exam: Completes semi-annually    Colonoscopy: Completed in  06/10/2020, recall in 7 years,  Lung Cancer Screening: N/A  PSA: Due  Sleep: falls asleep in recliner then goes to bed around 2 and will get up around 6 and feels rested. Does snore   Advance directive: living will       Review of Systems  Constitutional:  Negative for chills and fever.  Respiratory:  Negative for shortness of breath.   Cardiovascular:  Negative for chest pain and leg swelling.  Gastrointestinal:  Negative for abdominal pain, blood in stool, constipation, diarrhea, nausea and vomiting.       BM  daily   Genitourinary:  Negative for dysuria and hematuria.  Neurological:  Negative for tingling and headaches.  Psychiatric/Behavioral:  Negative for hallucinations and suicidal ideas.       Objective:     BP 110/78   Pulse 74   Temp 98.1 F (36.7 C) (Oral)   Ht 5\' 8"  (1.727 m)   Wt 228 lb 3.2 oz (103.5 kg)   SpO2 94%   BMI 34.70 kg/m  BP Readings from Last 3 Encounters:  08/30/23 110/78  02/24/23 138/80  01/21/23 (!) 157/61   Wt Readings from Last 3 Encounters:  08/30/23 228 lb 3.2 oz (103.5 kg)  02/24/23 231 lb (104.8 kg)  02/10/23 230 lb (104.3 kg)   SpO2 Readings from Last 3 Encounters:  08/30/23 94%   02/24/23 95%  01/21/23 93%      Physical Exam Vitals and nursing note reviewed.  Constitutional:      Appearance: Normal appearance.  HENT:     Right Ear: Tympanic membrane, ear canal and external ear normal.     Left Ear: Tympanic membrane, ear canal and external ear normal.     Mouth/Throat:     Mouth: Mucous membranes are moist.     Pharynx: Oropharynx is clear.  Eyes:     Extraocular Movements: Extraocular movements intact.     Pupils: Pupils are equal, round, and reactive to light.  Cardiovascular:     Rate and Rhythm: Normal rate and regular rhythm.     Pulses: Normal pulses.     Heart sounds: Normal heart sounds.  Pulmonary:     Effort: Pulmonary effort is normal.     Breath sounds: Normal breath sounds.  Abdominal:     General: Bowel sounds are normal. There is no distension.     Palpations: There is no mass.     Tenderness: There is no abdominal tenderness.     Hernia: No hernia is present.  Musculoskeletal:     Right lower leg: Edema present.     Left lower leg: Edema present.  Lymphadenopathy:     Cervical: No  cervical adenopathy.  Skin:    General: Skin is warm.  Neurological:     General: No focal deficit present.     Mental Status: He is alert.     Deep Tendon Reflexes:     Reflex Scores:      Bicep reflexes are 2+ on the right side and 2+ on the left side.      Patellar reflexes are 2+ on the right side and 2+ on the left side.    Comments: Bilateral upper and lower extremity strength 5/5  Psychiatric:        Mood and Affect: Mood normal.        Behavior: Behavior normal.        Thought Content: Thought content normal.        Judgment: Judgment normal.      No results found for any visits on 08/30/23.    The 10-year ASCVD risk score (Arnett DK, et al., 2019) is: 25%    Assessment & Plan:   Problem List Items Addressed This Visit       Cardiovascular and Mediastinum   Essential hypertension   Patient currently maintained on  metoprolol 12.5 mg daily.  Tolerates medication well.  Continue.  Blood pressure under good control      Relevant Orders   CBC   Comprehensive metabolic panel   Hemoglobin A1c   TSH   Lipid panel     Other   Obesity (BMI 30-39.9)   Pending TSH, A1c, lipid panel.  Continue working on healthy lifestyle modifications      Relevant Orders   Hemoglobin A1c   TSH   Lipid panel   Preventative health care - Primary   Discussed age-appropriate immunizations and screening exams.  Did review patient's personal, surgical, social, family histories.  Patient is up-to-date on all age-appropriate vaccinations he would like.  Update Prevnar 20 today patient to get the flu and shingles vaccine at local pharmacy.  Patient is up-to-date on CRC screening will do PSA for prostate cancer screening.  Patient was given information at discharge about preventative healthcare maintenance with anticipatory guidance.      Prediabetes   Pending A1c      Relevant Orders   Hemoglobin A1c   Former smoker   Pending urine microscopy for microscopic hematuria      Relevant Orders   Urine Microscopic   Other Visit Diagnoses       Need for pneumococcal 20-valent conjugate vaccination       Relevant Orders   Pneumococcal conjugate vaccine 20-valent (Prevnar 20) (Completed)     Screening for prostate cancer       Relevant Orders   PSA, Medicare       Return in about 6 months (around 02/28/2024) for BP recheck.    Audria Nine, NP

## 2023-08-30 NOTE — Assessment & Plan Note (Signed)
Patient currently maintained on metoprolol 12.5 mg daily.  Tolerates medication well.  Continue.  Blood pressure under good control

## 2023-08-30 NOTE — Assessment & Plan Note (Signed)
Pending A1c

## 2023-08-30 NOTE — Assessment & Plan Note (Signed)
Pending TSH, A1c, lipid panel.  Continue working on healthy lifestyle modifications

## 2023-08-30 NOTE — Assessment & Plan Note (Signed)
Discussed age-appropriate immunizations and screening exams.  Did review patient's personal, surgical, social, family histories.  Patient is up-to-date on all age-appropriate vaccinations he would like.  Update Prevnar 20 today patient to get the flu and shingles vaccine at local pharmacy.  Patient is up-to-date on CRC screening will do PSA for prostate cancer screening.  Patient was given information at discharge about preventative healthcare maintenance with anticipatory guidance.

## 2023-08-30 NOTE — Patient Instructions (Signed)
Nice to see you today I will be in touch with the labs once I have reviewed them Follow up with me in 6 months, sooner if you need me

## 2023-09-28 ENCOUNTER — Other Ambulatory Visit: Payer: Self-pay | Admitting: Nurse Practitioner

## 2023-12-06 DIAGNOSIS — H524 Presbyopia: Secondary | ICD-10-CM | POA: Diagnosis not present

## 2023-12-06 DIAGNOSIS — H43813 Vitreous degeneration, bilateral: Secondary | ICD-10-CM | POA: Diagnosis not present

## 2024-01-10 ENCOUNTER — Ambulatory Visit (INDEPENDENT_AMBULATORY_CARE_PROVIDER_SITE_OTHER): Admitting: Nurse Practitioner

## 2024-01-10 VITALS — BP 118/62 | HR 69 | Temp 98.1°F | Ht 68.0 in | Wt 234.0 lb

## 2024-01-10 DIAGNOSIS — S90424A Blister (nonthermal), right lesser toe(s), initial encounter: Secondary | ICD-10-CM | POA: Insufficient documentation

## 2024-01-10 MED ORDER — DOXYCYCLINE HYCLATE 100 MG PO TABS
100.0000 mg | ORAL_TABLET | Freq: Two times a day (BID) | ORAL | 0 refills | Status: AC
Start: 2024-01-10 — End: 2024-01-17

## 2024-01-10 NOTE — Assessment & Plan Note (Signed)
 Patient self popped at home.  Planning for beginnings of cellulitis around the base of the toenail culture with doxycycline 100 mg twice daily.  Patient does have area of macerated skin which A dressing and some topical liquid order.  Patient to leave it clean and dry he will cover it in.  Such as going outside or putting his shoes on.  Signs and symptoms reviewed when to seek health care

## 2024-01-10 NOTE — Progress Notes (Signed)
 Acute Office Visit  Subjective:     Patient ID: Stephen Ponce, male    DOB: [birth year], 78 y.o.   MRN: [id number]  Chief Complaint  Patient presents with   Toe Pain    Pt complains of R toenail pain that started yesterday after popping boil. Pt states of throbbing pain and states of infection (yellow pus appearance). Pt cleaned needle before use and applied neospirin afterwards. No foul odor or itchiness.     Toe Pain    Patient is in today for toe pain with a history of HTN, prediabetes, former smoker.   States that he noticed a blister on the right great toe that he took a needle and popped the blister yesterday. Sttaes that he used peroxide on the lesion afterwards. He also used neosporin . He did get a thin yellow liquid out. States that he could not feel it or did not know that he had it.   Since he popped it he is getting a throbbing pain that is intermittent. States that he has tired  States that he just finished clindamycin  from his dentist. Last does was on Friday    Review of Systems  Constitutional:  Negative for chills and fever.  Respiratory:  Negative for shortness of breath.   Cardiovascular:  Negative for chest pain.  Musculoskeletal:  Positive for joint pain.  Psychiatric/Behavioral:  Negative for suicidal ideas.         Objective:    BP 118/62   Pulse 69   Temp 98.1 F (36.7 C) (Oral)   Ht 5\' 8"  (1.727 m)   Wt 234 lb (106.1 kg)   SpO2 93%   BMI 35.58 kg/m  BP Readings from Last 3 Encounters:  01/10/24 118/62  08/30/23 110/78  02/24/23 138/80   Wt Readings from Last 3 Encounters:  01/10/24 234 lb (106.1 kg)  08/30/23 228 lb 3.2 oz (103.5 kg)  02/24/23 231 lb (104.8 kg)   SpO2 Readings from Last 3 Encounters:  01/10/24 93%  08/30/23 94%  02/24/23 95%      Physical Exam Vitals and nursing note reviewed.  Constitutional:      Appearance: Normal appearance.  Cardiovascular:     Rate and Rhythm: Normal rate and regular rhythm.      Heart sounds: Normal heart sounds.  Pulmonary:     Effort: Pulmonary effort is normal.     Breath sounds: Normal breath sounds.  Musculoskeletal:       Feet:  Neurological:     Mental Status: He is alert.     No results found for any visits on 01/10/24.      Assessment & Plan:   Problem List Items Addressed This Visit       Musculoskeletal and Integument   Blister of toe of right foot - Primary   Patient self popped at home.  Planning for beginnings of cellulitis around the base of the toenail culture with doxycycline 100 mg twice daily.  Patient does have area of macerated skin which A dressing and some topical liquid order.  Patient to leave it clean and dry he will cover it in.  Such as going outside or putting his shoes on.  Signs and symptoms reviewed when to seek health care      Relevant Medications   doxycycline (VIBRA-TABS) 100 MG tablet    Meds ordered this encounter  Medications   doxycycline (VIBRA-TABS) 100 MG tablet    Sig: Take 1 tablet (100 mg total) by  mouth 2 (two) times daily for 7 days.    Dispense:  14 tablet    Refill:  0    Supervising Provider:   Deri Fleet A [[id number]]    Return if symptoms worsen or fail to improve, for As scheduled .  Margarie Shay, NP

## 2024-01-10 NOTE — Patient Instructions (Signed)
 Nice to see you today Follow up with me as scheduled or sooner if you need me   Keep the area clean and dry. Leave it unwrapped while resting and at home. Do cover it in dirty environment or while in shoes

## 2024-02-14 NOTE — Progress Notes (Signed)
 Patient started the visit and then mention he no longer wanted to continue and disconnected the call, This encounter was created in error - please disregard.

## 2024-02-28 ENCOUNTER — Ambulatory Visit (INDEPENDENT_AMBULATORY_CARE_PROVIDER_SITE_OTHER): Payer: Medicare HMO | Admitting: Nurse Practitioner

## 2024-02-28 VITALS — BP 136/82 | HR 60 | Temp 98.6°F | Ht 68.0 in | Wt 229.2 lb

## 2024-02-28 DIAGNOSIS — I10 Essential (primary) hypertension: Secondary | ICD-10-CM | POA: Diagnosis not present

## 2024-02-28 NOTE — Patient Instructions (Signed)
 Nice to see you today Keep up the good work with the weight loss Follow up with me in 6 months for your physical and labs, sooner if you need me

## 2024-02-28 NOTE — Progress Notes (Signed)
   Established Patient Office Visit  Subjective   Patient ID: Stephen Ponce, male    DOB: March 10, 1946  Age: 78 y.o. MRN: [id number]  Chief Complaint  Patient presents with   Follow-up    Blood pressure check. Pt does not check at home but feels fine.     HPI  HTN: Patient was seen by me on 08/27/2019 for first physical is here today for 46-month follow-up for blood pressure.  Patient is currently maintained on metoprolol  12.5 mg daily.  Of note patient was seen in office for an acute visit on 01/10/2024 with normotensive blood pressure reading. He does not check his blood pressure at home  He states that he is doing 3-4 meals a day with smaller portions. He is still snacking. He like Dots pretzels. He makes a salty oatmeal cookie for snacks He does drink coffee and some gatorade zero sugar and lemon mango tea.     Review of Systems  Constitutional:  Negative for chills and fever.  Respiratory:  Negative for shortness of breath.   Cardiovascular:  Positive for leg swelling. Negative for chest pain.  Neurological:  Negative for headaches.      Objective:     BP 136/82   Pulse 60   Temp 98.6 F (37 C) (Oral)   Ht 5' 8 (1.727 m)   Wt 229 lb 3.2 oz (104 kg)   SpO2 97%   BMI 34.85 kg/m  BP Readings from Last 3 Encounters:  02/28/24 136/82  02/14/24 118/62  01/10/24 118/62   Wt Readings from Last 3 Encounters:  02/28/24 229 lb 3.2 oz (104 kg)  02/14/24 229 lb (103.9 kg)  01/10/24 234 lb (106.1 kg)   SpO2 Readings from Last 3 Encounters:  02/28/24 97%  01/10/24 93%  08/30/23 94%      Physical Exam Vitals and nursing note reviewed.  Constitutional:      Appearance: Normal appearance.   Cardiovascular:     Rate and Rhythm: Normal rate and regular rhythm.     Heart sounds: Normal heart sounds.  Pulmonary:     Effort: Pulmonary effort is normal.     Breath sounds: Normal breath sounds.   Musculoskeletal:     Right lower leg: Edema present.     Left lower leg:  Edema present.   Neurological:     Mental Status: He is alert.      No results found for any visits on 02/28/24.    The 10-year ASCVD risk score (Arnett DK, et al., 2019) is: 36%    Assessment & Plan:   Problem List Items Addressed This Visit       Cardiovascular and Mediastinum   Essential hypertension - Primary   Patient is currently on metoprolol  12.5mg  daily. Tolerating it well. Continue metoprolol  12.5mg  daily.        Return in about 6 months (around 08/29/2024) for CPE and Labs.    Adina Crandall, NP

## 2024-02-28 NOTE — Assessment & Plan Note (Signed)
 Patient is currently on metoprolol  12.5mg  daily. Tolerating it well. Continue metoprolol  12.5mg  daily.

## 2024-07-06 ENCOUNTER — Ambulatory Visit: Payer: Medicare HMO | Admitting: Dermatology

## 2024-07-06 DIAGNOSIS — D1801 Hemangioma of skin and subcutaneous tissue: Secondary | ICD-10-CM

## 2024-07-06 DIAGNOSIS — Z85828 Personal history of other malignant neoplasm of skin: Secondary | ICD-10-CM

## 2024-07-06 DIAGNOSIS — L820 Inflamed seborrheic keratosis: Secondary | ICD-10-CM | POA: Diagnosis not present

## 2024-07-06 DIAGNOSIS — D229 Melanocytic nevi, unspecified: Secondary | ICD-10-CM

## 2024-07-06 DIAGNOSIS — Z1283 Encounter for screening for malignant neoplasm of skin: Secondary | ICD-10-CM | POA: Diagnosis not present

## 2024-07-06 DIAGNOSIS — L814 Other melanin hyperpigmentation: Secondary | ICD-10-CM | POA: Diagnosis not present

## 2024-07-06 DIAGNOSIS — W908XXA Exposure to other nonionizing radiation, initial encounter: Secondary | ICD-10-CM

## 2024-07-06 DIAGNOSIS — L821 Other seborrheic keratosis: Secondary | ICD-10-CM

## 2024-07-06 DIAGNOSIS — Z7189 Other specified counseling: Secondary | ICD-10-CM

## 2024-07-06 DIAGNOSIS — L570 Actinic keratosis: Secondary | ICD-10-CM

## 2024-07-06 DIAGNOSIS — L578 Other skin changes due to chronic exposure to nonionizing radiation: Secondary | ICD-10-CM

## 2024-07-06 DIAGNOSIS — Z8589 Personal history of malignant neoplasm of other organs and systems: Secondary | ICD-10-CM

## 2024-07-06 NOTE — Progress Notes (Signed)
 Follow-Up Visit   Subjective  Stephen Ponce is a 78 y.o. male who presents for the following: Skin Cancer Screening and Full Body Skin Exam, hx of BCC, hx of SCC  The patient presents for Total-Body Skin Exam (TBSE) for skin cancer screening and mole check. The patient has spots, moles and lesions to be evaluated, some may be new or changing and the patient may have concern these could be cancer.   The following portions of the chart were reviewed this encounter and updated as appropriate: medications, allergies, medical history  Review of Systems:  No other skin or systemic complaints except as noted in HPI or Assessment and Plan.  Objective  Well appearing patient in no apparent distress; mood and affect are within normal limits.  A full examination was performed including scalp, head, eyes, ears, nose, lips, neck, chest, axillae, abdomen, back, buttocks, bilateral upper extremities, bilateral lower extremities, hands, feet, fingers, toes, fingernails, and toenails. All findings within normal limits unless otherwise noted below.   Relevant physical exam findings are noted in the Assessment and Plan.  face x 19 (19) Erythematous thin papules/macules with gritty scale.  right temple x 2 (2) Stuck-on, waxy, tan-brown papules and plaques -- Discussed benign etiology and prognosis.   Assessment & Plan   SKIN CANCER SCREENING PERFORMED TODAY.  LENTIGINES, SEBORRHEIC KERATOSES, HEMANGIOMAS - Benign normal skin lesions - Benign-appearing - Call for any changes  MELANOCYTIC NEVI - Tan-brown and/or pink-flesh-colored symmetric macules and papules - Benign appearing on exam today - Observation - Call clinic for new or changing moles - Recommend daily use of broad spectrum spf 30+ sunscreen to sun-exposed areas.   HISTORY OF BASAL CELL CARCINOMA OF THE SKIN - No evidence of recurrence today - Recommend regular full body skin exams - Recommend daily broad spectrum sunscreen SPF  30+ to sun-exposed areas, reapply every 2 hours as needed.  - Call if any new or changing lesions are noted between office visits   HISTORY OF SQUAMOUS CELL CARCINOMA OF THE SKIN - No evidence of recurrence today - No lymphadenopathy - Recommend regular full body skin exams - Recommend daily broad spectrum sunscreen SPF 30+ to sun-exposed areas, reapply every 2 hours as needed.  - Call if any new or changing lesions are noted between office visits  AK (ACTINIC KERATOSIS) (19) face x 19 (19) ACTINIC DAMAGE WITH PRECANCEROUS ACTINIC KERATOSES Counseling for Topical Chemotherapy Management: Patient exhibits: - Severe, confluent actinic changes with pre-cancerous actinic keratoses that is secondary to cumulative UV radiation exposure over time - Condition that is severe; chronic, not at goal. - diffuse scaly erythematous macules and papules with underlying dyspigmentation - Discussed Prescription Field Treatment topical Chemotherapy for Severe, Chronic Confluent Actinic Changes with Pre-Cancerous Actinic Keratoses Field treatment involves treatment of an entire area of skin that has confluent Actinic Changes (Sun/ Ultraviolet light damage) and PreCancerous Actinic Keratoses by method of PhotoDynamic Therapy (PDT) and/or prescription Topical Chemotherapy agents such as 5-fluorouracil, 5-fluorouracil/calcipotriene, and/or imiquimod.  The purpose is to decrease the number of clinically evident and subclinical PreCancerous lesions to prevent progression to development of skin cancer by chemically destroying early precancer changes that may or may not be visible.  It has been shown to reduce the risk of developing skin cancer in the treated area. As a result of treatment, redness, scaling, crusting, and open sores may occur during treatment course. One or more than one of these methods may be used and may have to be used  several times to control, suppress and eliminate the PreCancerous changes. Discussed  treatment course, expected reaction, and possible side effects. - Recommend daily broad spectrum sunscreen SPF 30+ to sun-exposed areas, reapply every 2 hours as needed.  - Staying in the shade or wearing long sleeves, sun glasses (UVA+UVB protection) and wide brim hats (4-inch brim around the entire circumference of the hat) are also recommended. - Call for new or changing lesions.   In December return to the office for red light with debridement- face  Destruction of lesion - face x 19 (19) Complexity: simple   Destruction method: cryotherapy   Informed consent: discussed and consent obtained   Timeout:  patient name, date of birth, surgical site, and procedure verified Lesion destroyed using liquid nitrogen: Yes   Region frozen until ice ball extended beyond lesion: Yes   Outcome: patient tolerated procedure well with no complications   Post-procedure details: wound care instructions given    INFLAMED SEBORRHEIC KERATOSIS (2) right temple x 2 (2) Symptomatic, irritating, patient would like treated.  Destruction of lesion - right temple x 2 (2) Complexity: simple   Destruction method: cryotherapy   Informed consent: discussed and consent obtained   Timeout:  patient name, date of birth, surgical site, and procedure verified Lesion destroyed using liquid nitrogen: Yes   Region frozen until ice ball extended beyond lesion: Yes   Outcome: patient tolerated procedure well with no complications   Post-procedure details: wound care instructions given     Return in about 6 weeks (around 08/17/2024) for red light with debridment-face and TBSE in 6 months .  IFay Kirks, CMA, am acting as scribe for Alm Rhyme, MD .   Documentation: I have reviewed the above documentation for accuracy and completeness, and I agree with the above.  Alm Rhyme, MD

## 2024-07-06 NOTE — Patient Instructions (Addendum)
 Red light pamphlet given        Cryotherapy Aftercare  Wash gently with soap and water  everyday.   Apply Vaseline and Band-Aid daily until healed.      Due to recent changes in healthcare laws, you may see results of your pathology and/or laboratory studies on MyChart before the doctors have had a chance to review them. We understand that in some cases there may be results that are confusing or concerning to you. Please understand that not all results are received at the same time and often the doctors may need to interpret multiple results in order to provide you with the best plan of care or course of treatment. Therefore, we ask that you please give us  2 business days to thoroughly review all your results before contacting the office for clarification. Should we see a critical lab result, you will be contacted sooner.   If You Need Anything After Your Visit  If you have any questions or concerns for your doctor, please call our main line at [phone] and press option 4 to reach your doctor's medical assistant. If no one answers, please leave a voicemail as directed and we will return your call as soon as possible. Messages left after 4 pm will be answered the following business day.   You may also send us  a message via MyChart. We typically respond to MyChart messages within 1-2 business days.  For prescription refills, please ask your pharmacy to contact our office. Our fax number is [phone].  If you have an urgent issue when the clinic is closed that cannot wait until the next business day, you can page your doctor at the number below.    Please note that while we do our best to be available for urgent issues outside of office hours, we are not available 24/7.   If you have an urgent issue and are unable to reach us , you may choose to seek medical care at your doctor's office, retail clinic, urgent care center, or emergency room.  If you have a medical emergency, please  immediately call 911 or go to the emergency department.  Pager Numbers  - Dr. Hester: [phone]  - Dr. Jackquline: [phone]  - Dr. Claudene: [phone]   - Dr. Raymund: [phone]  In the event of inclement weather, please call our main line at [phone] for an update on the status of any delays or closures.  Dermatology Medication Tips: Please keep the boxes that topical medications come in in order to help keep track of the instructions about where and how to use these. Pharmacies typically print the medication instructions only on the boxes and not directly on the medication tubes.   If your medication is too expensive, please contact our office at [phone] option 4 or send us  a message through MyChart.   We are unable to tell what your co-pay for medications will be in advance as this is different depending on your insurance coverage. However, we may be able to find a substitute medication at lower cost or fill out paperwork to get insurance to cover a needed medication.   If a prior authorization is required to get your medication covered by your insurance company, please allow us  1-2 business days to complete this process.  Drug prices often vary depending on where the prescription is filled and some pharmacies may offer cheaper prices.  The website www.goodrx.com contains coupons for medications through different pharmacies. The prices here do not account for what the  cost may be with help from insurance (it may be cheaper with your insurance), but the website can give you the price if you did not use any insurance.  - You can print the associated coupon and take it with your prescription to the pharmacy.  - You may also stop by our office during regular business hours and pick up a GoodRx coupon card.  - If you need your prescription sent electronically to a different pharmacy, notify our office through [facility] or by phone at [phone] option  4.     Si Usted Necesita Algo Despus de Su Visita  Tambin puede enviarnos un mensaje a travs de Clinical Cytogeneticist. Por lo general respondemos a los mensajes de MyChart en el transcurso de 1 a 2 das hbiles.  Para renovar recetas, por favor pida a su farmacia que se ponga en contacto con nuestra oficina. Randi lakes de fax es [place] [phone].  Si tiene un asunto urgente cuando la clnica est cerrada y que no puede esperar hasta el siguiente da hbil, puede llamar/localizar a su doctor(a) al nmero que aparece a continuacin.   Por favor, tenga en cuenta que aunque hacemos todo lo posible para estar disponibles para asuntos urgentes fuera del horario de [place], no estamos disponibles las 24 horas del da, los 7 [address]  [phone] de la [place].   Si tiene un problema urgente y no puede comunicarse con nosotros, puede optar por buscar atencin mdica  en el consultorio de su doctor(a), en una clnica privada, en un centro de atencin urgente o en una sala de emergencias.  Si tiene engineer, drilling, por favor llame inmediatamente al 911 o vaya a la sala de emergencias.  Nmeros de bper  - Dr. Hester: [phone]  - Dra. Jackquline: [phone]  - Dr. Claudene: [phone]  - Dra. Kitts: [phone]  En caso de inclemencias del [place], por favor llame a nuestra lnea principal al [phone] para una actualizacin sobre el estado de cualquier retraso o cierre.  Consejos para la medicacin en dermatologa: Por favor, guarde las cajas en las que vienen los medicamentos de uso tpico para ayudarle a seguir las instrucciones sobre dnde y cmo usarlos. Las farmacias generalmente imprimen las instrucciones del medicamento slo en las cajas y no directamente en los tubos del [place].   Si su medicamento es muy caro, por favor, pngase en contacto con landry rieger llamando al [phone] y presione la opcin 4 o envenos un mensaje a travs de Clinical Cytogeneticist.   No podemos decirle cul ser su copago  por los medicamentos por adelantado ya que esto es diferente dependiendo de la cobertura de su seguro. Sin embargo, es posible que podamos encontrar un medicamento sustituto a audiological scientist un formulario para que el seguro cubra el medicamento que se considera necesario.   Si se requiere una autorizacin previa para que su compaa de seguros cubra su medicamento, por favor permtanos de 1 a 2 das hbiles para completar este proceso.  Los precios de los medicamentos varan con frecuencia dependiendo del environmental consultant de dnde se surte la receta y alguna farmacias pueden ofrecer precios ms baratos.  El sitio web www.goodrx.com tiene cupones para medicamentos de health and safety inspector. Los precios aqu no tienen en cuenta lo que podra costar con la ayuda del seguro (puede ser ms barato con su seguro), pero el sitio web puede darle el precio si no utiliz tourist information centre manager.  - Puede imprimir el cupn correspondiente y llevarlo con su receta a la farmacia.  -  Tambin puede pasar por nuestra oficina durante el horario de atencin regular y education officer, museum una tarjeta de cupones de GoodRx.  - Si necesita que su receta se enve electrnicamente a una farmacia diferente, informe a nuestra oficina a travs de MyChart de [place] o por telfono llamando al [phone] y presione la opcin 4.

## 2024-07-12 ENCOUNTER — Encounter: Payer: Self-pay | Admitting: Dermatology

## 2024-07-20 ENCOUNTER — Encounter: Payer: Self-pay | Admitting: Pharmacist

## 2024-07-20 NOTE — Progress Notes (Signed)
 Pharmacy Quality Measure Review  This patient is appearing on a report for being at risk of failing the Controlling Blood Pressure measure this calendar year.   BP Readings from Last 1 Encounters:  02/28/24 136/82   <140/90 = PASS  Encounter note placed 12/26, ensure recheck if BP >140/90 Future Appointments  Date Time Provider Department Center  08/17/2024  8:30 AM ASC-NURSE ROOM [facility] None  08/17/2024  9:30 AM [facility]-NURSE RO[facility] None  09/01/2024  8:00 AM Wendee Lynwood HERO, NP [facility] [address]  01/04/2025  9:00 AM Hester Alm BROCKS, MD [facility] None

## 2024-08-17 ENCOUNTER — Ambulatory Visit (INDEPENDENT_AMBULATORY_CARE_PROVIDER_SITE_OTHER): Admitting: Dermatology

## 2024-08-17 ENCOUNTER — Encounter

## 2024-08-17 ENCOUNTER — Encounter: Payer: Self-pay | Admitting: Dermatology

## 2024-08-17 DIAGNOSIS — L570 Actinic keratosis: Secondary | ICD-10-CM | POA: Diagnosis not present

## 2024-08-17 MED ORDER — AMINOLEVULINIC ACID HCL 10 % EX GEL
2000.0000 mg | Freq: Once | CUTANEOUS | Status: AC
  Administered 2024-08-17: 2000 mg via TOPICAL

## 2024-08-17 NOTE — Patient Instructions (Signed)

## 2024-08-17 NOTE — Progress Notes (Signed)
 Patient completed red light phototherapy with debridement today.  ACTINIC KERATOSES Exam: Erythematous thin papules/macules with gritty scale.  Treatment Plan:  Red Light Photodynamic therapy  Procedure discussed: discussed risks, benefits, side effects. and alternatives   Prep: site scrubbed/prepped with acetone   Debridement needed: Yes (performed by Physician with sand paper.  (CPT [id number])) Location:  face Number of lesions:  Multiple (> 15) Type of treatment:  Red light Aminolevulinic Acid (see MAR for details): Ameluz Aminolevulinic Acid comment:  [id number] Amount of Ameluz (mg):  1 Incubation time (minutes):  60 Number of minutes under lamp:  20 Cooling:  Fan Outcome: patient tolerated procedure well with no complications   Post-procedure details: sunscreen applied and aftercare instructions given to patient    Related Medications Aminolevulinic Acid HCl 10 % GEL 2,000 mg  Amanda White, RMA  I personally debrided area prior to application of aminolevulinic acid  Documentation: I have reviewed the above documentation for accuracy and completeness, and I agree with the above.  Alm Rhyme, MD

## 2024-09-01 ENCOUNTER — Encounter: Payer: Self-pay | Admitting: Nurse Practitioner

## 2024-09-01 ENCOUNTER — Ambulatory Visit: Admitting: Nurse Practitioner

## 2024-09-01 VITALS — BP 122/78 | HR 55 | Temp 97.9°F | Ht 68.0 in | Wt 223.0 lb

## 2024-09-01 DIAGNOSIS — Z126 Encounter for screening for malignant neoplasm of bladder: Secondary | ICD-10-CM | POA: Diagnosis not present

## 2024-09-01 DIAGNOSIS — I10 Essential (primary) hypertension: Secondary | ICD-10-CM | POA: Diagnosis not present

## 2024-09-01 DIAGNOSIS — Z0000 Encounter for general adult medical examination without abnormal findings: Secondary | ICD-10-CM | POA: Diagnosis not present

## 2024-09-01 DIAGNOSIS — R202 Paresthesia of skin: Secondary | ICD-10-CM | POA: Diagnosis not present

## 2024-09-01 DIAGNOSIS — R600 Localized edema: Secondary | ICD-10-CM

## 2024-09-01 DIAGNOSIS — E669 Obesity, unspecified: Secondary | ICD-10-CM

## 2024-09-01 LAB — CBC WITH DIFFERENTIAL/PLATELET
Basophils Absolute: 0.1 K/uL (ref 0.0–0.1)
Basophils Relative: 1.2 % (ref 0.0–3.0)
Eosinophils Absolute: 0.3 K/uL (ref 0.0–0.7)
Eosinophils Relative: 6.3 % — ABNORMAL HIGH (ref 0.0–5.0)
HCT: 44.6 % (ref 39.0–52.0)
Hemoglobin: 15.2 g/dL (ref 13.0–17.0)
Lymphocytes Relative: 18.1 % (ref 12.0–46.0)
Lymphs Abs: 1 K/uL (ref 0.7–4.0)
MCHC: 34 g/dL (ref 30.0–36.0)
MCV: 91.8 fl (ref 78.0–100.0)
Monocytes Absolute: 0.6 K/uL (ref 0.1–1.0)
Monocytes Relative: 10.3 % (ref 3.0–12.0)
Neutro Abs: 3.5 K/uL (ref 1.4–7.7)
Neutrophils Relative %: 64.1 % (ref 43.0–77.0)
Platelets: 181 K/uL (ref 150.0–400.0)
RBC: 4.86 Mil/uL (ref 4.22–5.81)
RDW: 12.9 % (ref 11.5–15.5)
WBC: 5.4 K/uL (ref 4.0–10.5)

## 2024-09-01 LAB — COMPREHENSIVE METABOLIC PANEL WITH GFR
ALT: 24 U/L (ref 3–53)
AST: 22 U/L (ref 5–37)
Albumin: 4.2 g/dL (ref 3.5–5.2)
Alkaline Phosphatase: 42 U/L (ref 39–117)
BUN: 18 mg/dL (ref 6–23)
CO2: 29 meq/L (ref 19–32)
Calcium: 9.4 mg/dL (ref 8.4–10.5)
Chloride: 103 meq/L (ref 96–112)
Creatinine, Ser: 0.71 mg/dL (ref 0.40–1.50)
GFR: 87.58 mL/min
Glucose, Bld: 103 mg/dL — ABNORMAL HIGH (ref 70–99)
Potassium: 4.5 meq/L (ref 3.5–5.1)
Sodium: 140 meq/L (ref 135–145)
Total Bilirubin: 1.1 mg/dL (ref 0.2–1.2)
Total Protein: 6.8 g/dL (ref 6.0–8.3)

## 2024-09-01 LAB — LIPID PANEL
Cholesterol: 142 mg/dL (ref 28–200)
HDL: 43.6 mg/dL
LDL Cholesterol: 83 mg/dL (ref 10–99)
NonHDL: 98.78
Total CHOL/HDL Ratio: 3
Triglycerides: 81 mg/dL (ref 10.0–149.0)
VLDL: 16.2 mg/dL (ref 0.0–40.0)

## 2024-09-01 LAB — URINALYSIS, MICROSCOPIC ONLY

## 2024-09-01 LAB — VITAMIN B12: Vitamin B-12: 862 pg/mL (ref 211–911)

## 2024-09-01 LAB — TSH: TSH: 0.87 u[IU]/mL (ref 0.35–5.50)

## 2024-09-01 LAB — BRAIN NATRIURETIC PEPTIDE: Pro B Natriuretic peptide (BNP): 64 pg/mL (ref 1.0–100.0)

## 2024-09-01 NOTE — Assessment & Plan Note (Signed)
 History of the same.  Patient currently maintained on metoprolol  12.5 mg daily.  Blood pressure controlled.  Patient tolerates medication well continue medication as prescribed

## 2024-09-01 NOTE — Assessment & Plan Note (Signed)
Pending TSH and lipid panel.

## 2024-09-01 NOTE — Assessment & Plan Note (Signed)
 History of bilateral carpal tunnel syndrome.  Patient having intermittent paresthesias to bilateral lower extremities will check B12, TSH, CMP, CBC.  If all normal can consider getting bilateral lower extremity ultrasounds to check for circulation in setting of paresthesias and lower extremity edema

## 2024-09-01 NOTE — Progress Notes (Signed)
 "  Established Patient Office Visit  Subjective   Patient ID: BENNO BRENSINGER, male    DOB: Feb 19, 1946  Age: 78 y.o. MRN: [id number]  Chief Complaint  Patient presents with   Annual Exam    HPI  HTN: Patient currently maintained on metoprolol  12.5 mg daily.  Medication well dizziness or lightheadedness  for complete physical and follow up of chronic conditions.  Immunizations: -Tetanus: Completed in 2016, repeat 2026 at local pharmacy -Influenza: refused  -Shingles: Completed Zosta vax live.  Discussed Shingrix.  Get at local pharmacy -Pneumonia: Completed 2024  Diet: Fair diet. He is eating 3 meals and snacks. He is doing coffee and water . He will also drink pinapple mango unsweet tea. Exercise: No regular exercise. Yard work   [employer] exam: Completes annually.   Dental exam: Completes semi-annually    Colonoscopy: Completed in 06/10/2020, repeat 7 years.  Patient due 2028 Lung Cancer Screening: Aged out  PSA: joint discussion and declined  Sleep: going to bed around vaires. He will fall asleep in his recliner. And up by 530-6.  Advanced directive: does not have       Review of Systems  Constitutional:  Negative for chills and fever.  Respiratory:  Negative for shortness of breath.   Cardiovascular:  Negative for chest pain and leg swelling.  Gastrointestinal:  Negative for abdominal pain, blood in stool, constipation, diarrhea, nausea and vomiting.       BM every other day   Genitourinary:  Negative for dysuria and hematuria.       Nocturia x 1  Neurological:  Positive for tingling (bilateral hands). Negative for dizziness and headaches.  Psychiatric/Behavioral:  Negative for hallucinations and suicidal ideas.       Objective:     BP 122/78   Pulse (!) 55   Temp 97.9 F (36.6 C) (Oral)   Ht 5' 8 (1.727 m)   Wt 223 lb (101.2 kg)   SpO2 97%   BMI 33.91 kg/m  BP Readings from Last 3 Encounters:  09/01/24 122/78  02/28/24 136/82  02/14/24 118/62   Wt  Readings from Last 3 Encounters:  09/01/24 223 lb (101.2 kg)  02/28/24 229 lb 3.2 oz (104 kg)  02/14/24 229 lb (103.9 kg)   SpO2 Readings from Last 3 Encounters:  09/01/24 97%  02/28/24 97%  01/10/24 93%      Physical Exam Vitals and nursing note reviewed.  Constitutional:      Appearance: Normal appearance.  HENT:     Right Ear: Tympanic membrane, ear canal and external ear normal.     Left Ear: Tympanic membrane, ear canal and external ear normal.     Mouth/Throat:     Mouth: Mucous membranes are moist.     Pharynx: Oropharynx is clear.  Eyes:     Extraocular Movements: Extraocular movements intact.     Pupils: Pupils are equal, round, and reactive to light.  Cardiovascular:     Rate and Rhythm: Normal rate and regular rhythm.     Pulses: Normal pulses.     Heart sounds: Normal heart sounds.  Pulmonary:     Effort: Pulmonary effort is normal.     Breath sounds: Normal breath sounds.  Abdominal:     General: Bowel sounds are normal. There is no distension.     Palpations: There is no mass.     Tenderness: There is no abdominal tenderness.     Hernia: No hernia is present.  Musculoskeletal:     Right  lower leg: Edema present.     Left lower leg: Edema present.  Lymphadenopathy:     Cervical: No cervical adenopathy.  Skin:    General: Skin is warm.  Neurological:     General: No focal deficit present.     Mental Status: He is alert.     Deep Tendon Reflexes:     Reflex Scores:      Bicep reflexes are 2+ on the right side and 2+ on the left side.      Patellar reflexes are 2+ on the right side and 2+ on the left side.    Comments: Bilateral upper and lower extremity strength 5/5  Psychiatric:        Mood and Affect: Mood normal.        Behavior: Behavior normal.        Thought Content: Thought content normal.        Judgment: Judgment normal.      No results found for any visits on 09/01/24.    The 10-year ASCVD risk score (Arnett DK, et al., 2019) is:  30.8%    Assessment & Plan:   Problem List Items Addressed This Visit       Cardiovascular and Mediastinum   Essential hypertension   History of the same.  Patient currently maintained on metoprolol  12.5 mg daily.  Blood pressure controlled.  Patient tolerates medication well continue medication as prescribed      Relevant Orders   Comprehensive metabolic panel with GFR   CBC with Differential/Platelet   Lipid panel   TSH   Brain natriuretic peptide     Other   Obesity (BMI 30-39.9)   Pending TSH and lipid panel.      Preventative health care - Primary   Discussed age-appropriate immunizations and screening exams.  Did review patient's personal, surgical, social, family history.  Patient up-to-date with all age-appropriate vaccinations he would like.  Patient declined flu vaccine today.  Patient up-to-date on CRC screening.  Had joint discussion about PSA patient politely declined.  Patient was given information at discharge about preventative healthcare maintenance with anticipatory guidance.      Relevant Orders   Comprehensive metabolic panel with GFR   CBC with Differential/Platelet   TSH   Bilateral lower extremity edema   History of same with left greater than right.  Does get better with elevation likely dependent edema.  In setting of hypertension will check BNP to rule out heart failure.  Consider lower extremity ultrasound      Relevant Orders   Brain natriuretic peptide   Paresthesia   History of bilateral carpal tunnel syndrome.  Patient having intermittent paresthesias to bilateral lower extremities will check B12, TSH, CMP, CBC.  If all normal can consider getting bilateral lower extremity ultrasounds to check for circulation in setting of paresthesias and lower extremity edema      Relevant Orders   Comprehensive metabolic panel with GFR   CBC with Differential/Platelet   Vitamin B12   TSH   Other Visit Diagnoses       Screening for bladder cancer        Relevant Orders   Urine Microscopic       Return in about 6 months (around 03/02/2025) for BP recheck.    Adina Crandall, NP  "

## 2024-09-01 NOTE — Patient Instructions (Signed)
 Nice to see you today  I will be in touch with the labs once I have them  Follow up with me in 6 months, sooner if you need me   Consider getting the Shingrix vaccine (shinlges) at your local pharmacy

## 2024-09-01 NOTE — Assessment & Plan Note (Signed)
 Discussed age-appropriate immunizations and screening exams.  Did review patient's personal, surgical, social, family history.  Patient up-to-date with all age-appropriate vaccinations he would like.  Patient declined flu vaccine today.  Patient up-to-date on CRC screening.  Had joint discussion about PSA patient politely declined.  Patient was given information at discharge about preventative healthcare maintenance with anticipatory guidance.

## 2024-09-01 NOTE — Assessment & Plan Note (Signed)
 History of same with left greater than right.  Does get better with elevation likely dependent edema.  In setting of hypertension will check BNP to rule out heart failure.  Consider lower extremity ultrasound

## 2024-09-04 ENCOUNTER — Ambulatory Visit: Payer: Self-pay | Admitting: Nurse Practitioner

## 2024-09-26 NOTE — Progress Notes (Signed)
 Stephen Ponce                                          MRN: [id number]   09/26/2024   The [facility] Quality Team Specialist reviewed this patient medical record for the purposes of chart review for care gap closure. The following were reviewed: abstraction for care gap closure-controlling blood pressure.    [facility] Quality Team

## 2024-10-12 ENCOUNTER — Other Ambulatory Visit: Payer: Self-pay | Admitting: Nurse Practitioner

## 2025-01-04 ENCOUNTER — Ambulatory Visit: Admitting: Dermatology

## 2025-03-02 ENCOUNTER — Ambulatory Visit: Admitting: Nurse Practitioner
